# Patient Record
Sex: Male | Born: 1960
Health system: Southern US, Community
[De-identification: ages and names within clinical notes are randomized; demographics above are authoritative.]

## PROBLEM LIST (undated history)

## (undated) DIAGNOSIS — F32A Depression, unspecified: Secondary | ICD-10-CM

## (undated) DIAGNOSIS — J45909 Unspecified asthma, uncomplicated: Secondary | ICD-10-CM

## (undated) DIAGNOSIS — N049 Nephrotic syndrome with unspecified morphologic changes: Secondary | ICD-10-CM

## (undated) DIAGNOSIS — F419 Anxiety disorder, unspecified: Secondary | ICD-10-CM

## (undated) DIAGNOSIS — F329 Major depressive disorder, single episode, unspecified: Secondary | ICD-10-CM

## (undated) DIAGNOSIS — I10 Essential (primary) hypertension: Secondary | ICD-10-CM

## (undated) HISTORY — PX: RENAL BIOPSY: SHX156

---
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
## 2005-01-07 ENCOUNTER — Encounter: Admission: RE | Admit: 2005-01-07 | Discharge: 2005-01-07 | Payer: Self-pay | Admitting: Family Medicine

## 2013-10-12 ENCOUNTER — Ambulatory Visit (INDEPENDENT_AMBULATORY_CARE_PROVIDER_SITE_OTHER): Payer: 59 | Admitting: Physician Assistant

## 2013-10-12 VITALS — BP 130/90 | HR 122 | Temp 99.7°F | Wt 229.0 lb

## 2013-10-12 DIAGNOSIS — J309 Allergic rhinitis, unspecified: Secondary | ICD-10-CM

## 2013-10-12 DIAGNOSIS — J329 Chronic sinusitis, unspecified: Secondary | ICD-10-CM

## 2013-10-12 DIAGNOSIS — R0981 Nasal congestion: Secondary | ICD-10-CM

## 2013-10-12 DIAGNOSIS — J3489 Other specified disorders of nose and nasal sinuses: Secondary | ICD-10-CM

## 2013-10-12 MED ORDER — AMOXICILLIN-POT CLAVULANATE 875-125 MG PO TABS: 1.0000 | ORAL_TABLET | Freq: Two times a day (BID) | ORAL | Status: DC

## 2013-10-12 NOTE — Progress Notes (Signed)
   Subjective:    Patient ID: Darren Hamilton Andrew Pensyl, male    DOB: [birth year], 53 y.o.   MRN: [id number]  HPI 53 year old male presents for evaluation of a possible sinus infection. Symptoms have been progressively worsening x 1 week. Has left maxillary sinus pain, body aches, and chills.  Complains of slight sinus headache.  Admits he has been taking Zyrtec-D for about [redacted] weeks along with using his nasonex.    Denies sore throat, otalgia, cough, SOB, wheezing, nausea, or vomiting.   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     Review of Systems  Constitutional: Positive for fever and chills.  HENT: Positive for congestion, postnasal drip, rhinorrhea and sinus pressure. Negative for sore throat.   Respiratory: Negative for cough, shortness of breath and wheezing.   Gastrointestinal: Negative for nausea and vomiting.  Neurological: Positive for headaches (sinus). Negative for dizziness.       Objective:   Physical Exam  Constitutional: He is oriented to person, place, and time. He appears well-developed and well-nourished.  HENT:  Head: Normocephalic and atraumatic.  Right Ear: Hearing, tympanic membrane, external ear and ear canal normal.  Left Ear: Hearing, tympanic membrane, external ear and ear canal normal.  Nose: Right sinus exhibits no frontal sinus tenderness. Left sinus exhibits maxillary sinus tenderness. Left sinus exhibits no frontal sinus tenderness.  Mouth/Throat: Uvula is midline, oropharynx is clear and moist and mucous membranes are normal.  Eyes: Conjunctivae are normal.  Neck: Normal range of motion. Neck supple.  Cardiovascular: Normal rate, regular rhythm and normal heart sounds.   Pulmonary/Chest: Effort normal and breath sounds normal.  Lymphadenopathy:    He has no cervical adenopathy.  Neurological: He is alert and oriented to person, place, and time.  Psychiatric: He has a normal mood and affect. His behavior is normal. Judgment and thought content  normal.          Assessment & Plan:  Sinus infection - Plan: amoxicillin-clavulanate (AUGMENTIN) 875-125 MG per tablet  Nasal congestion  Allergic rhinitis  Will treat as sinus infection with Augmentin 875 mg bid x 10 days D/C Zyrtec-D. Change to plain Mucinex twice daily to help with congestion Push fluids RTC precautions discussed Follow up if symptoms worsen or fail to improve.

## 2013-11-09 ENCOUNTER — Ambulatory Visit (INDEPENDENT_AMBULATORY_CARE_PROVIDER_SITE_OTHER): Payer: 59 | Admitting: Physician Assistant

## 2013-11-09 VITALS — BP 128/84 | HR 101 | Temp 97.8°F | Resp 18 | Ht 67.0 in | Wt 235.0 lb

## 2013-11-09 DIAGNOSIS — R21 Rash and other nonspecific skin eruption: Secondary | ICD-10-CM

## 2013-11-09 DIAGNOSIS — J3489 Other specified disorders of nose and nasal sinuses: Secondary | ICD-10-CM

## 2013-11-09 MED ORDER — DOXYCYCLINE HYCLATE 100 MG PO CAPS: 100.0000 mg | ORAL_CAPSULE | Freq: Two times a day (BID) | ORAL | Status: DC

## 2013-11-09 NOTE — Progress Notes (Signed)
Subjective:    Patient ID: Darren Hamilton, male    DOB: 30-Apr-1961, 53 y.o.   MRN: [id number]  HPI Primary Physician: No primary provider on file.  Chief Complaint: Right nostril pain x 4 days  HPI: 53 y.o. male with history below presents with 4 day history of right nostril pain. Pain is located along the insider of the right nostril at the distal portion of the septum. No injury or trauma to the nose, although his son dad jump up and hit him in the nose this morning. No drainage or discharge from the nose. Afebrile. No chills. No swelling. No rashes. Inside portion of the right nostril is quite tender to the touch. He was recently sick with sinusitis. Was treated with Augmentin. He took all of this. His sinusitis fully resolved with that treatment.    History reviewed. No pertinent past medical history.   Home Meds: Prior to Admission medications   Medication Sig Start Date End Date Taking? Authorizing Provider  aspirin 81 MG tablet Take 81 mg by mouth daily.   Yes Historical Provider, MD  buPROPion (WELLBUTRIN SR) 150 MG 12 hr tablet Take 150 mg by mouth daily.   Yes Historical Provider, MD  gemfibrozil (LOPID) 600 MG tablet Take 600 mg by mouth 2 (two) times daily before a meal.   Yes Historical Provider, MD  lisinopril (PRINIVIL,ZESTRIL) 10 MG tablet Take 10 mg by mouth daily.   Yes Historical Provider, MD  mometasone (NASONEX) 50 MCG/ACT nasal spray Place 2 sprays into the nose daily.   Yes Historical Provider, MD  Multiple Vitamin (MULTIVITAMIN) tablet Take 1 tablet by mouth daily.   Yes Historical Provider, MD  Olopatadine HCl (PATANASE) 0.6 % SOLN Place into the nose 2 (two) times daily.   Yes Historical Provider, MD  ranitidine (ZANTAC) 150 MG capsule Take 150 mg by mouth as needed for heartburn.   Yes Historical Provider, MD  sertraline (ZOLOFT) 50 MG tablet Take 50 mg by mouth daily.   Yes Historical Provider, MD    Allergies: No Known Allergies  History   Social History    . Marital Status: Single    Spouse Name: N/A    Number of Children: N/A  . Years of Education: N/A   Occupational History  . Not on file.   Social History Main Topics  . Smoking status: Never Smoker   . Smokeless tobacco: Never Used  . Alcohol Use: Yes  . Drug Use: No  . Sexual Activity: Not on file   Other Topics Concern  . Not on file   Social History Narrative  . No narrative on file     Review of Systems  Constitutional: Negative for fever and chills.  HENT: Positive for postnasal drip. Negative for congestion, nosebleeds, rhinorrhea, sinus pressure and sore throat.   Respiratory: Negative for cough.        Objective:   Physical Exam  Physical Exam: Blood pressure 128/84, pulse 101, temperature 97.8 F (36.6 C), temperature source Oral, resp. rate 18, height 5\' 7"  (1.702 m), weight 235 lb (106.595 kg), SpO2 95.00%., Body mass index is 36.8 kg/(m^2). General: Well developed, well nourished, in no acute distress. Head: Normocephalic, atraumatic, eyes without discharge, sclera non-icteric, nares are without discharge. There is a single dried up erythematous lesion along the inside wall of the right septum. This lesion is quite tender to palpation. No vesicles seen. Bilateral auditory canals clear, TM's are without perforation, pearly grey and translucent with reflective cone  of light bilaterally. Oral cavity moist, posterior pharynx without exudate, erythema, peritonsillar abscess, or post nasal drip. Uvula midline.   Neck: Supple. No thyromegaly. Full ROM. No lymphadenopathy. Lungs: Clear bilaterally to auscultation without wheezes, rales, or rhonchi. Breathing is unlabored. Heart: RRR with S1 S2. No murmurs, rubs, or gallops appreciated. Msk:  Strength and tone normal for age. Extremities/Skin: Warm and dry. No clubbing or cyanosis. No edema. No rashes or suspicious lesions. Neuro: Alert and oriented X 3. Moves all extremities spontaneously. Gait is normal. CNII-XII  grossly in tact. Psych:  Responds to questions appropriately with a normal affect.        Assessment & Plan:  53 year old male with cellulitis of the nare -Lesion is along the septum -Doxycycline 100 mg 1 po bid #20 no RF -Close follow up -If still symptomatic at the beginning of the week plan for ENT evaluation    Eula Listenyan Emmaclaire Switala, MHS, PA-C [facility] [address] [place], [place] [address] [phone] [facility] 11/09/2013 2:41 PM

## 2014-03-28 ENCOUNTER — Encounter: Payer: Self-pay | Admitting: *Deleted

## 2014-11-09 ENCOUNTER — Ambulatory Visit (INDEPENDENT_AMBULATORY_CARE_PROVIDER_SITE_OTHER): Payer: 59 | Admitting: Emergency Medicine

## 2014-11-09 ENCOUNTER — Ambulatory Visit (INDEPENDENT_AMBULATORY_CARE_PROVIDER_SITE_OTHER): Payer: 59

## 2014-11-09 VITALS — BP 130/90 | HR 112 | Temp 98.1°F | Ht 67.0 in | Wt 233.4 lb

## 2014-11-09 DIAGNOSIS — H109 Unspecified conjunctivitis: Secondary | ICD-10-CM | POA: Diagnosis not present

## 2014-11-09 DIAGNOSIS — J309 Allergic rhinitis, unspecified: Secondary | ICD-10-CM | POA: Diagnosis not present

## 2014-11-09 DIAGNOSIS — J040 Acute laryngitis: Secondary | ICD-10-CM

## 2014-11-09 DIAGNOSIS — R0981 Nasal congestion: Secondary | ICD-10-CM

## 2014-11-09 DIAGNOSIS — R05 Cough: Secondary | ICD-10-CM

## 2014-11-09 DIAGNOSIS — F329 Major depressive disorder, single episode, unspecified: Secondary | ICD-10-CM | POA: Diagnosis not present

## 2014-11-09 DIAGNOSIS — F32A Depression, unspecified: Secondary | ICD-10-CM

## 2014-11-09 DIAGNOSIS — R059 Cough, unspecified: Secondary | ICD-10-CM

## 2014-11-09 LAB — POCT CBC
Granulocyte percent: 62 %G (ref 37–80)
HCT, POC: 43.6 % (ref 43.5–53.7)
Hemoglobin: 14.3 g/dL (ref 14.1–18.1)
Lymph, poc: 2.6 (ref 0.6–3.4)
MCH, POC: 82.8 pg — AB (ref 27–31.2)
MCHC: 32.8 g/dL (ref 31.8–35.4)
MCV: 88 fL (ref 80–97)
MID (cbc): 0.8 (ref 0–0.9)
MPV: 6.8 fL (ref 0–99.8)
PLATELET COUNT, POC: 403 10*3/uL (ref 142–424)
POC Granulocyte: 5.6 (ref 2–6.9)
POC LYMPH %: 29.1 % (ref 10–50)
POC MID %: 8.9 %M (ref 0–12)
RBC: 4.95 M/uL (ref 4.69–6.13)
RDW, POC: 14 %
WBC: 9 10*3/uL (ref 4.6–10.2)

## 2014-11-09 MED ORDER — AZITHROMYCIN 250 MG PO TABS: ORAL_TABLET | ORAL | Status: DC

## 2014-11-09 MED ORDER — HYDROCODONE-HOMATROPINE 5-1.5 MG/5ML PO SYRP: 5.0000 mL | ORAL_SOLUTION | Freq: Three times a day (TID) | ORAL | Status: DC | PRN

## 2014-11-09 MED ORDER — OFLOXACIN 0.3 % OP SOLN: 1.0000 [drp] | Freq: Four times a day (QID) | OPHTHALMIC | Status: DC

## 2014-11-09 MED ORDER — BECLOMETHASONE DIPROPIONATE 80 MCG/ACT IN AERS: 2.0000 | INHALATION_SPRAY | Freq: Two times a day (BID) | RESPIRATORY_TRACT | Status: DC

## 2014-11-09 NOTE — Progress Notes (Signed)
Subjective:    Patient ID: Darren Hamilton, male    DOB: 1960-08-18, 54 y.o.   MRN: [id number]  HPI  Scribed by Chalmers Cater RN with Dr. Earl Lites present in the room.  54 yo male presents with bronchitis in room 14 @ [facility].   He states symptoms began a week ago  last Saturday, with viral issues, diarrhea, with wheezing beginning on Tuesday and has been getting worse with laryngitis over the past 3 days.  Through the weekeend, pt has been coughing with related rib pain.  Product coughing up has been greenish colored  And cough continues.   Pt using Albuterol every 4 hours since Tuesday evening and seems to be helping less.  Pt denies fever.  Admits to myalgia.  Beginning Friday, pt had drainage from eyes  R>L. With thick mucous drainage resolving into tears.  Taking Mucinex and Tylenol.  Pt states his son has had similar symptoms 2 weeks before today.   Pt denies any ear pain. Admits to tonsil pain/soreness.    Review of Systems  Constitutional: Positive for fatigue. Negative for chills.  HENT: Positive for congestion and postnasal drip.   Eyes: Positive for discharge and redness.  Respiratory: Positive for cough and wheezing.   Cardiovascular: Negative for chest pain.  Gastrointestinal: Negative.   Endocrine: Negative.        Objective:   Physical Exam  Constitutional: He appears well-developed and well-nourished.  HENT:  Right Ear: External ear normal.  Left Ear: External ear normal.  Eyes: Pupils are equal, round, and reactive to light.  Appears to be a clear drainage from both eyes.  Cardiovascular: Normal rate and regular rhythm.   Pulmonary/Chest:  Chest has coarse breath sounds no rales. No areas of dullness.  Abdominal: There is no tenderness.   [facility] reading (PRIMARY) by  Dr. Cleta Alberts increased markings in both bases. There is elevation of the right hemidiaphragm. There are no pneumonic infiltrates. Results for orders placed or performed in visit on 11/09/14  POCT CBC    Result Value Ref Range   WBC 9.0 4.6 - 10.2 K/uL   Lymph, poc 2.6 0.6 - 3.4   POC LYMPH PERCENT 29.1 10 - 50 %L   MID (cbc) 0.8 0 - 0.9   POC MID % 8.9 0 - 12 %M   POC Granulocyte 5.6 2 - 6.9   Granulocyte percent 62.0 37 - 80 %G   RBC 4.95 4.69 - 6.13 M/uL   Hemoglobin 14.3 14.1 - 18.1 g/dL   HCT, POC 40.9 81.1 - 53.7 %   MCV 88.0 80 - 97 fL   MCH, POC 82.8 (A) 27 - 31.2 pg   MCHC 32.8 31.8 - 35.4 g/dL   RDW, POC 91.4 %   Platelet Count, POC 403 142 - 424 K/uL   MPV 6.8 0 - 99.8 fL   Meds ordered this encounter  Medications  . albuterol (PROVENTIL HFA;VENTOLIN HFA) 108 (90 BASE) MCG/ACT inhaler    Sig: Inhale 1-2 puffs into the lungs every 6 (six) hours as needed for wheezing or shortness of breath.  . escitalopram (LEXAPRO) 20 MG tablet    Sig: Take 20 mg by mouth daily.  . fluticasone (FLONASE) 50 MCG/ACT nasal spray    Sig: Place 2 sprays into both nostrils daily.  Marland Kitchen acetaminophen (TYLENOL) 325 MG tablet    Sig: Take 650 mg by mouth every 6 (six) hours as needed.  Marland Kitchen guaiFENesin (MUCINEX) 600 MG 12 hr tablet  Sig: Take by mouth 2 (two) times daily as needed.  . Bepotastine Besilate 1.5 % SOLN    Sig: Place 1 drop into both eyes 2 (two) times daily.  Marland Kitchen. HYDROcodone-homatropine (HYCODAN) 5-1.5 MG/5ML syrup    Sig: Take 5 mLs by mouth every 8 (eight) hours as needed for cough.    Dispense:  120 mL    Refill:  0  . beclomethasone (QVAR) 80 MCG/ACT inhaler    Sig: Inhale 2 puffs into the lungs 2 (two) times daily.    Dispense:  1 Inhaler    Refill:  11  . azithromycin (ZITHROMAX) 250 MG tablet    Sig: Take 2 tabs PO x 1 dose, then 1 tab PO QD x 4 days    Dispense:  6 tablet    Refill:  0  . ofloxacin (OCUFLOX) 0.3 % ophthalmic solution    Sig: Place 1 drop into both eyes 4 (four) times daily.    Dispense:  5 mL    Refill:  0       Assessment & Plan:  1. Cough  - POCT CBC - DG Chest 2 View; Future - HYDROcodone-homatropine (HYCODAN) 5-1.5 MG/5ML syrup; Take 5  mLs by mouth every 8 (eight) hours as needed for cough.  Dispense: 120 mL; Refill: 0 - beclomethasone (QVAR) 80 MCG/ACT inhaler; Inhale 2 puffs into the lungs 2 (two) times daily.  Dispense: 1 Inhaler; Refill: 11 - azithromycin (ZITHROMAX) 250 MG tablet; Take 2 tabs PO x 1 dose, then 1 tab PO QD x 4 days  Dispense: 6 tablet; Refill: 0  2. Nasal congestion He can use saline spray for this.  3. Allergic rhinitis, unspecified allergic rhinitis type He is on Zyrtec.  4. Laryngitis Chest x-ray unremarkable - DG Chest 2 View; Future  5. Bilateral conjunctivitis  - ofloxacin (OCUFLOX) 0.3 % ophthalmic solution; Place 1 drop into both eyes 4 (four) times daily.  Dispense: 5 mL; Refill: 0   I personally performed the services described in this documentation, which was scribed in my presence. The recorded information has been reviewed and is accurate.  Lesle ChrisSteven Klark Vanderhoef, MD  [facility], [facility]  11/09/2014 1:57 PM

## 2015-09-12 ENCOUNTER — Ambulatory Visit (INDEPENDENT_AMBULATORY_CARE_PROVIDER_SITE_OTHER): Payer: 59 | Admitting: Physician Assistant

## 2015-09-12 VITALS — BP 116/78 | HR 91 | Temp 97.5°F | Resp 18 | Ht 66.5 in | Wt 236.0 lb

## 2015-09-12 DIAGNOSIS — J029 Acute pharyngitis, unspecified: Secondary | ICD-10-CM | POA: Diagnosis not present

## 2015-09-12 LAB — POCT RAPID STREP A (OFFICE): Rapid Strep A Screen: NEGATIVE

## 2015-09-12 MED ORDER — AMOXICILLIN 875 MG PO TABS: 875.0000 mg | ORAL_TABLET | Freq: Two times a day (BID) | ORAL | Status: DC

## 2015-09-12 MED ORDER — HYDROCODONE-HOMATROPINE 5-1.5 MG/5ML PO SYRP: 5.0000 mL | ORAL_SOLUTION | Freq: Every day | ORAL | Status: DC

## 2015-09-12 NOTE — Progress Notes (Signed)
09/12/2015 11:31 AM   DOB: [birth year] / MRN: [id number]  SUBJECTIVE:  Darren Hamilton is a 55 y.o. male presenting for a sore throat that started last night. He denies cough and anterior neck pain.  Denies fever and has not taken anything that would reduce fever.  Feels that he is getting worse.  Associates some sinus congestion however this is normal for him during this time of year.   He has No Known Allergies.   He  has no past medical history on file.    He  reports that he has never smoked. He has never used smokeless tobacco. He reports that he drinks alcohol. He reports that he does not use illicit drugs. He  has no sexual activity history on file. The patient  has no past surgical history on file.  His family history is not on file.  Review of Systems  Constitutional: Negative for chills and diaphoresis.  Respiratory: Negative for cough and shortness of breath.   Gastrointestinal: Negative for nausea and abdominal pain.  Skin: Negative for rash.  Neurological: Negative for dizziness and headaches.    Problem list and medications reviewed and updated by myself where necessary, and exist elsewhere in the encounter.   OBJECTIVE:  BP 116/78 mmHg  Pulse 91  Temp(Src) 97.5 F (36.4 C) (Oral)  Resp 18  Ht 5' 6.5" (1.689 m)  Wt 236 lb (107.049 kg)  BMI 37.53 kg/m2  SpO2 97%  Physical Exam  Constitutional: He is oriented to person, place, and time. He appears well-developed. He does not appear ill.  HENT:  Right Ear: Tympanic membrane normal.  Left Ear: Tympanic membrane normal.  Nose: Nose normal.  Mouth/Throat:    Eyes: Conjunctivae and EOM are normal. Pupils are equal, round, and reactive to light.  Cardiovascular: Normal rate.   Pulmonary/Chest: Effort normal.  Abdominal: He exhibits no distension.  Musculoskeletal: Normal range of motion.  Lymphadenopathy:       Head (right side): No tonsillar adenopathy present.       Head (left side): No tonsillar adenopathy  present.    He has no cervical adenopathy.    He has no axillary adenopathy.  Neurological: He is alert and oriented to person, place, and time. No cranial nerve deficit. Coordination normal.  Skin: Skin is warm and dry. He is not diaphoretic.  Psychiatric: He has a normal mood and affect.  Nursing note and vitals reviewed.  No results found for: CREATININE  No results found for this or any previous visit (from the past 72 hour(s)).  No results found.  ASSESSMENT AND PLAN  Darren Hamilton was seen today for sore throat and sinusitis.  Diagnoses and all orders for this visit:  Sore throat: He lacks cough, lymphadenopathy and there is no exudate in his throat.  This may be an early cold or an early strep.  Advised symptomatic care for now.  Will provide Amox however advised that he not fill this until the culture results, as again this may be an early viral illness and he has not developed cough.Provided hycodan and advised that he fill this only if he begins to cough.  He is to call if he has any problems.   -     POCT rapid strep A -     Culture, Group A Strep   The patient was advised to call or return to clinic if he does not see an improvement in symptoms or to seek the care of the closest emergency department  if he worsens with the above plan.   Deliah BostonMichael Clark, MHS, PA-C [facility] 09/12/2015 11:31 AM

## 2015-09-12 NOTE — Patient Instructions (Addendum)
     IF you received an x-ray today, you will receive an invoice from [facility]. Please contact [facility] at [phone] with questions or concerns regarding your invoice.   IF you received labwork today, you will receive an invoice from [facility]. Please contact [facility] at [phone] with questions or concerns regarding your invoice.   Our billing staff will not be able to assist you with questions regarding bills from these companies.  You will be contacted with the lab results as soon as they are available. The fastest way to get your results is to activate your My Chart account. Instructions are located on the last page of this paperwork. If you have not heard from us regarding the results in 2 weeks, please contact this office.      

## 2015-09-13 LAB — CULTURE, GROUP A STREP: Organism ID, Bacteria: NORMAL

## 2016-05-24 DIAGNOSIS — J3089 Other allergic rhinitis: Secondary | ICD-10-CM | POA: Diagnosis not present

## 2016-05-24 DIAGNOSIS — F39 Unspecified mood [affective] disorder: Secondary | ICD-10-CM | POA: Diagnosis not present

## 2016-05-24 DIAGNOSIS — J3081 Allergic rhinitis due to animal (cat) (dog) hair and dander: Secondary | ICD-10-CM | POA: Diagnosis not present

## 2016-06-07 DIAGNOSIS — J3081 Allergic rhinitis due to animal (cat) (dog) hair and dander: Secondary | ICD-10-CM | POA: Diagnosis not present

## 2016-06-07 DIAGNOSIS — J3089 Other allergic rhinitis: Secondary | ICD-10-CM | POA: Diagnosis not present

## 2016-06-14 DIAGNOSIS — J3081 Allergic rhinitis due to animal (cat) (dog) hair and dander: Secondary | ICD-10-CM | POA: Diagnosis not present

## 2016-06-14 DIAGNOSIS — J3089 Other allergic rhinitis: Secondary | ICD-10-CM | POA: Diagnosis not present

## 2016-06-14 DIAGNOSIS — F39 Unspecified mood [affective] disorder: Secondary | ICD-10-CM | POA: Diagnosis not present

## 2016-06-28 DIAGNOSIS — F39 Unspecified mood [affective] disorder: Secondary | ICD-10-CM | POA: Diagnosis not present

## 2016-06-28 DIAGNOSIS — J3081 Allergic rhinitis due to animal (cat) (dog) hair and dander: Secondary | ICD-10-CM | POA: Diagnosis not present

## 2016-06-28 DIAGNOSIS — J3089 Other allergic rhinitis: Secondary | ICD-10-CM | POA: Diagnosis not present

## 2016-07-19 DIAGNOSIS — J3089 Other allergic rhinitis: Secondary | ICD-10-CM | POA: Diagnosis not present

## 2016-07-19 DIAGNOSIS — J3081 Allergic rhinitis due to animal (cat) (dog) hair and dander: Secondary | ICD-10-CM | POA: Diagnosis not present

## 2016-07-19 DIAGNOSIS — F39 Unspecified mood [affective] disorder: Secondary | ICD-10-CM | POA: Diagnosis not present

## 2016-07-26 DIAGNOSIS — J3081 Allergic rhinitis due to animal (cat) (dog) hair and dander: Secondary | ICD-10-CM | POA: Diagnosis not present

## 2016-07-26 DIAGNOSIS — J3089 Other allergic rhinitis: Secondary | ICD-10-CM | POA: Diagnosis not present

## 2016-08-02 DIAGNOSIS — J3081 Allergic rhinitis due to animal (cat) (dog) hair and dander: Secondary | ICD-10-CM | POA: Diagnosis not present

## 2016-08-02 DIAGNOSIS — J3089 Other allergic rhinitis: Secondary | ICD-10-CM | POA: Diagnosis not present

## 2016-08-02 DIAGNOSIS — F39 Unspecified mood [affective] disorder: Secondary | ICD-10-CM | POA: Diagnosis not present

## 2016-08-09 DIAGNOSIS — J3089 Other allergic rhinitis: Secondary | ICD-10-CM | POA: Diagnosis not present

## 2016-08-09 DIAGNOSIS — J3081 Allergic rhinitis due to animal (cat) (dog) hair and dander: Secondary | ICD-10-CM | POA: Diagnosis not present

## 2016-08-23 DIAGNOSIS — F39 Unspecified mood [affective] disorder: Secondary | ICD-10-CM | POA: Diagnosis not present

## 2016-08-23 DIAGNOSIS — J3089 Other allergic rhinitis: Secondary | ICD-10-CM | POA: Diagnosis not present

## 2016-08-23 DIAGNOSIS — J3081 Allergic rhinitis due to animal (cat) (dog) hair and dander: Secondary | ICD-10-CM | POA: Diagnosis not present

## 2016-08-30 DIAGNOSIS — J3081 Allergic rhinitis due to animal (cat) (dog) hair and dander: Secondary | ICD-10-CM | POA: Diagnosis not present

## 2016-08-30 DIAGNOSIS — J3089 Other allergic rhinitis: Secondary | ICD-10-CM | POA: Diagnosis not present

## 2016-09-06 DIAGNOSIS — J3089 Other allergic rhinitis: Secondary | ICD-10-CM | POA: Diagnosis not present

## 2016-09-06 DIAGNOSIS — J3081 Allergic rhinitis due to animal (cat) (dog) hair and dander: Secondary | ICD-10-CM | POA: Diagnosis not present

## 2016-09-13 DIAGNOSIS — F39 Unspecified mood [affective] disorder: Secondary | ICD-10-CM | POA: Diagnosis not present

## 2016-09-13 DIAGNOSIS — J3081 Allergic rhinitis due to animal (cat) (dog) hair and dander: Secondary | ICD-10-CM | POA: Diagnosis not present

## 2016-09-13 DIAGNOSIS — J3089 Other allergic rhinitis: Secondary | ICD-10-CM | POA: Diagnosis not present

## 2016-09-20 DIAGNOSIS — J3081 Allergic rhinitis due to animal (cat) (dog) hair and dander: Secondary | ICD-10-CM | POA: Diagnosis not present

## 2016-09-20 DIAGNOSIS — J3089 Other allergic rhinitis: Secondary | ICD-10-CM | POA: Diagnosis not present

## 2016-09-27 DIAGNOSIS — J3081 Allergic rhinitis due to animal (cat) (dog) hair and dander: Secondary | ICD-10-CM | POA: Diagnosis not present

## 2016-09-27 DIAGNOSIS — J3089 Other allergic rhinitis: Secondary | ICD-10-CM | POA: Diagnosis not present

## 2016-10-04 DIAGNOSIS — J3089 Other allergic rhinitis: Secondary | ICD-10-CM | POA: Diagnosis not present

## 2016-10-04 DIAGNOSIS — J3081 Allergic rhinitis due to animal (cat) (dog) hair and dander: Secondary | ICD-10-CM | POA: Diagnosis not present

## 2016-10-04 DIAGNOSIS — F39 Unspecified mood [affective] disorder: Secondary | ICD-10-CM | POA: Diagnosis not present

## 2016-10-11 DIAGNOSIS — J3089 Other allergic rhinitis: Secondary | ICD-10-CM | POA: Diagnosis not present

## 2016-10-11 DIAGNOSIS — J309 Allergic rhinitis, unspecified: Secondary | ICD-10-CM | POA: Diagnosis not present

## 2016-10-11 DIAGNOSIS — E782 Mixed hyperlipidemia: Secondary | ICD-10-CM | POA: Diagnosis not present

## 2016-10-11 DIAGNOSIS — I10 Essential (primary) hypertension: Secondary | ICD-10-CM | POA: Diagnosis not present

## 2016-10-11 DIAGNOSIS — F322 Major depressive disorder, single episode, severe without psychotic features: Secondary | ICD-10-CM | POA: Diagnosis not present

## 2016-10-11 DIAGNOSIS — J3081 Allergic rhinitis due to animal (cat) (dog) hair and dander: Secondary | ICD-10-CM | POA: Diagnosis not present

## 2016-10-18 DIAGNOSIS — J3081 Allergic rhinitis due to animal (cat) (dog) hair and dander: Secondary | ICD-10-CM | POA: Diagnosis not present

## 2016-10-18 DIAGNOSIS — J3089 Other allergic rhinitis: Secondary | ICD-10-CM | POA: Diagnosis not present

## 2016-10-20 DIAGNOSIS — J3089 Other allergic rhinitis: Secondary | ICD-10-CM | POA: Diagnosis not present

## 2016-10-20 DIAGNOSIS — J3081 Allergic rhinitis due to animal (cat) (dog) hair and dander: Secondary | ICD-10-CM | POA: Diagnosis not present

## 2016-10-25 DIAGNOSIS — J3081 Allergic rhinitis due to animal (cat) (dog) hair and dander: Secondary | ICD-10-CM | POA: Diagnosis not present

## 2016-10-25 DIAGNOSIS — F39 Unspecified mood [affective] disorder: Secondary | ICD-10-CM | POA: Diagnosis not present

## 2016-10-25 DIAGNOSIS — J3089 Other allergic rhinitis: Secondary | ICD-10-CM | POA: Diagnosis not present

## 2016-11-01 DIAGNOSIS — J3081 Allergic rhinitis due to animal (cat) (dog) hair and dander: Secondary | ICD-10-CM | POA: Diagnosis not present

## 2016-11-01 DIAGNOSIS — J3089 Other allergic rhinitis: Secondary | ICD-10-CM | POA: Diagnosis not present

## 2016-11-07 DIAGNOSIS — J3089 Other allergic rhinitis: Secondary | ICD-10-CM | POA: Diagnosis not present

## 2016-11-07 DIAGNOSIS — J3081 Allergic rhinitis due to animal (cat) (dog) hair and dander: Secondary | ICD-10-CM | POA: Diagnosis not present

## 2016-11-08 DIAGNOSIS — F322 Major depressive disorder, single episode, severe without psychotic features: Secondary | ICD-10-CM | POA: Diagnosis not present

## 2016-11-15 DIAGNOSIS — J3089 Other allergic rhinitis: Secondary | ICD-10-CM | POA: Diagnosis not present

## 2016-11-15 DIAGNOSIS — J3081 Allergic rhinitis due to animal (cat) (dog) hair and dander: Secondary | ICD-10-CM | POA: Diagnosis not present

## 2016-11-22 DIAGNOSIS — J301 Allergic rhinitis due to pollen: Secondary | ICD-10-CM | POA: Diagnosis not present

## 2016-11-22 DIAGNOSIS — J3089 Other allergic rhinitis: Secondary | ICD-10-CM | POA: Diagnosis not present

## 2016-11-22 DIAGNOSIS — F39 Unspecified mood [affective] disorder: Secondary | ICD-10-CM | POA: Diagnosis not present

## 2016-11-29 DIAGNOSIS — J3081 Allergic rhinitis due to animal (cat) (dog) hair and dander: Secondary | ICD-10-CM | POA: Diagnosis not present

## 2016-11-29 DIAGNOSIS — J3089 Other allergic rhinitis: Secondary | ICD-10-CM | POA: Diagnosis not present

## 2016-12-06 DIAGNOSIS — F39 Unspecified mood [affective] disorder: Secondary | ICD-10-CM | POA: Diagnosis not present

## 2016-12-06 DIAGNOSIS — J3081 Allergic rhinitis due to animal (cat) (dog) hair and dander: Secondary | ICD-10-CM | POA: Diagnosis not present

## 2016-12-06 DIAGNOSIS — J3089 Other allergic rhinitis: Secondary | ICD-10-CM | POA: Diagnosis not present

## 2016-12-13 DIAGNOSIS — J3081 Allergic rhinitis due to animal (cat) (dog) hair and dander: Secondary | ICD-10-CM | POA: Diagnosis not present

## 2016-12-13 DIAGNOSIS — J3089 Other allergic rhinitis: Secondary | ICD-10-CM | POA: Diagnosis not present

## 2016-12-20 DIAGNOSIS — F39 Unspecified mood [affective] disorder: Secondary | ICD-10-CM | POA: Diagnosis not present

## 2016-12-20 DIAGNOSIS — J3081 Allergic rhinitis due to animal (cat) (dog) hair and dander: Secondary | ICD-10-CM | POA: Diagnosis not present

## 2016-12-20 DIAGNOSIS — J3089 Other allergic rhinitis: Secondary | ICD-10-CM | POA: Diagnosis not present

## 2016-12-27 DIAGNOSIS — J3089 Other allergic rhinitis: Secondary | ICD-10-CM | POA: Diagnosis not present

## 2016-12-27 DIAGNOSIS — J3081 Allergic rhinitis due to animal (cat) (dog) hair and dander: Secondary | ICD-10-CM | POA: Diagnosis not present

## 2017-01-03 DIAGNOSIS — J3081 Allergic rhinitis due to animal (cat) (dog) hair and dander: Secondary | ICD-10-CM | POA: Diagnosis not present

## 2017-01-03 DIAGNOSIS — J3089 Other allergic rhinitis: Secondary | ICD-10-CM | POA: Diagnosis not present

## 2017-01-03 DIAGNOSIS — F39 Unspecified mood [affective] disorder: Secondary | ICD-10-CM | POA: Diagnosis not present

## 2017-01-10 DIAGNOSIS — J3081 Allergic rhinitis due to animal (cat) (dog) hair and dander: Secondary | ICD-10-CM | POA: Diagnosis not present

## 2017-01-10 DIAGNOSIS — J3089 Other allergic rhinitis: Secondary | ICD-10-CM | POA: Diagnosis not present

## 2017-01-17 DIAGNOSIS — J3081 Allergic rhinitis due to animal (cat) (dog) hair and dander: Secondary | ICD-10-CM | POA: Diagnosis not present

## 2017-01-17 DIAGNOSIS — J3089 Other allergic rhinitis: Secondary | ICD-10-CM | POA: Diagnosis not present

## 2017-01-24 DIAGNOSIS — J3089 Other allergic rhinitis: Secondary | ICD-10-CM | POA: Diagnosis not present

## 2017-01-24 DIAGNOSIS — J3081 Allergic rhinitis due to animal (cat) (dog) hair and dander: Secondary | ICD-10-CM | POA: Diagnosis not present

## 2017-01-31 DIAGNOSIS — F39 Unspecified mood [affective] disorder: Secondary | ICD-10-CM | POA: Diagnosis not present

## 2017-02-02 DIAGNOSIS — J3081 Allergic rhinitis due to animal (cat) (dog) hair and dander: Secondary | ICD-10-CM | POA: Diagnosis not present

## 2017-02-02 DIAGNOSIS — J3089 Other allergic rhinitis: Secondary | ICD-10-CM | POA: Diagnosis not present

## 2017-02-07 DIAGNOSIS — J3089 Other allergic rhinitis: Secondary | ICD-10-CM | POA: Diagnosis not present

## 2017-02-07 DIAGNOSIS — J3081 Allergic rhinitis due to animal (cat) (dog) hair and dander: Secondary | ICD-10-CM | POA: Diagnosis not present

## 2017-02-28 DIAGNOSIS — J3089 Other allergic rhinitis: Secondary | ICD-10-CM | POA: Diagnosis not present

## 2017-02-28 DIAGNOSIS — R062 Wheezing: Secondary | ICD-10-CM | POA: Diagnosis not present

## 2017-02-28 DIAGNOSIS — R0609 Other forms of dyspnea: Secondary | ICD-10-CM | POA: Diagnosis not present

## 2017-02-28 DIAGNOSIS — J3081 Allergic rhinitis due to animal (cat) (dog) hair and dander: Secondary | ICD-10-CM | POA: Diagnosis not present

## 2017-03-07 DIAGNOSIS — F39 Unspecified mood [affective] disorder: Secondary | ICD-10-CM | POA: Diagnosis not present

## 2017-03-21 DIAGNOSIS — J3081 Allergic rhinitis due to animal (cat) (dog) hair and dander: Secondary | ICD-10-CM | POA: Diagnosis not present

## 2017-03-21 DIAGNOSIS — F39 Unspecified mood [affective] disorder: Secondary | ICD-10-CM | POA: Diagnosis not present

## 2017-03-21 DIAGNOSIS — J3089 Other allergic rhinitis: Secondary | ICD-10-CM | POA: Diagnosis not present

## 2017-03-28 DIAGNOSIS — J3081 Allergic rhinitis due to animal (cat) (dog) hair and dander: Secondary | ICD-10-CM | POA: Diagnosis not present

## 2017-03-28 DIAGNOSIS — J3089 Other allergic rhinitis: Secondary | ICD-10-CM | POA: Diagnosis not present

## 2017-04-04 DIAGNOSIS — J3089 Other allergic rhinitis: Secondary | ICD-10-CM | POA: Diagnosis not present

## 2017-04-04 DIAGNOSIS — F39 Unspecified mood [affective] disorder: Secondary | ICD-10-CM | POA: Diagnosis not present

## 2017-04-04 DIAGNOSIS — J3081 Allergic rhinitis due to animal (cat) (dog) hair and dander: Secondary | ICD-10-CM | POA: Diagnosis not present

## 2017-04-11 DIAGNOSIS — J3081 Allergic rhinitis due to animal (cat) (dog) hair and dander: Secondary | ICD-10-CM | POA: Diagnosis not present

## 2017-04-11 DIAGNOSIS — J3089 Other allergic rhinitis: Secondary | ICD-10-CM | POA: Diagnosis not present

## 2017-04-18 DIAGNOSIS — J3081 Allergic rhinitis due to animal (cat) (dog) hair and dander: Secondary | ICD-10-CM | POA: Diagnosis not present

## 2017-04-18 DIAGNOSIS — F39 Unspecified mood [affective] disorder: Secondary | ICD-10-CM | POA: Diagnosis not present

## 2017-04-18 DIAGNOSIS — J3089 Other allergic rhinitis: Secondary | ICD-10-CM | POA: Diagnosis not present

## 2017-04-25 DIAGNOSIS — J3081 Allergic rhinitis due to animal (cat) (dog) hair and dander: Secondary | ICD-10-CM | POA: Diagnosis not present

## 2017-04-25 DIAGNOSIS — J3089 Other allergic rhinitis: Secondary | ICD-10-CM | POA: Diagnosis not present

## 2017-05-02 DIAGNOSIS — J3089 Other allergic rhinitis: Secondary | ICD-10-CM | POA: Diagnosis not present

## 2017-05-02 DIAGNOSIS — J3081 Allergic rhinitis due to animal (cat) (dog) hair and dander: Secondary | ICD-10-CM | POA: Diagnosis not present

## 2017-05-09 DIAGNOSIS — J3089 Other allergic rhinitis: Secondary | ICD-10-CM | POA: Diagnosis not present

## 2017-05-09 DIAGNOSIS — F39 Unspecified mood [affective] disorder: Secondary | ICD-10-CM | POA: Diagnosis not present

## 2017-05-23 DIAGNOSIS — F39 Unspecified mood [affective] disorder: Secondary | ICD-10-CM | POA: Diagnosis not present

## 2017-06-13 DIAGNOSIS — J3081 Allergic rhinitis due to animal (cat) (dog) hair and dander: Secondary | ICD-10-CM | POA: Diagnosis not present

## 2017-06-13 DIAGNOSIS — J3089 Other allergic rhinitis: Secondary | ICD-10-CM | POA: Diagnosis not present

## 2017-06-20 DIAGNOSIS — J3089 Other allergic rhinitis: Secondary | ICD-10-CM | POA: Diagnosis not present

## 2017-06-20 DIAGNOSIS — J3081 Allergic rhinitis due to animal (cat) (dog) hair and dander: Secondary | ICD-10-CM | POA: Diagnosis not present

## 2017-06-27 DIAGNOSIS — J3089 Other allergic rhinitis: Secondary | ICD-10-CM | POA: Diagnosis not present

## 2017-06-27 DIAGNOSIS — J3081 Allergic rhinitis due to animal (cat) (dog) hair and dander: Secondary | ICD-10-CM | POA: Diagnosis not present

## 2017-06-27 DIAGNOSIS — F39 Unspecified mood [affective] disorder: Secondary | ICD-10-CM | POA: Diagnosis not present

## 2017-07-04 DIAGNOSIS — J3089 Other allergic rhinitis: Secondary | ICD-10-CM | POA: Diagnosis not present

## 2017-07-04 DIAGNOSIS — J3081 Allergic rhinitis due to animal (cat) (dog) hair and dander: Secondary | ICD-10-CM | POA: Diagnosis not present

## 2017-07-11 DIAGNOSIS — J3081 Allergic rhinitis due to animal (cat) (dog) hair and dander: Secondary | ICD-10-CM | POA: Diagnosis not present

## 2017-07-11 DIAGNOSIS — J3089 Other allergic rhinitis: Secondary | ICD-10-CM | POA: Diagnosis not present

## 2017-07-11 DIAGNOSIS — F39 Unspecified mood [affective] disorder: Secondary | ICD-10-CM | POA: Diagnosis not present

## 2017-07-18 DIAGNOSIS — J3089 Other allergic rhinitis: Secondary | ICD-10-CM | POA: Diagnosis not present

## 2017-07-18 DIAGNOSIS — J3081 Allergic rhinitis due to animal (cat) (dog) hair and dander: Secondary | ICD-10-CM | POA: Diagnosis not present

## 2017-07-25 DIAGNOSIS — J3081 Allergic rhinitis due to animal (cat) (dog) hair and dander: Secondary | ICD-10-CM | POA: Diagnosis not present

## 2017-07-25 DIAGNOSIS — J3089 Other allergic rhinitis: Secondary | ICD-10-CM | POA: Diagnosis not present

## 2017-07-25 DIAGNOSIS — F39 Unspecified mood [affective] disorder: Secondary | ICD-10-CM | POA: Diagnosis not present

## 2017-08-01 DIAGNOSIS — J3081 Allergic rhinitis due to animal (cat) (dog) hair and dander: Secondary | ICD-10-CM | POA: Diagnosis not present

## 2017-08-01 DIAGNOSIS — J3089 Other allergic rhinitis: Secondary | ICD-10-CM | POA: Diagnosis not present

## 2017-08-08 DIAGNOSIS — J3089 Other allergic rhinitis: Secondary | ICD-10-CM | POA: Diagnosis not present

## 2017-08-08 DIAGNOSIS — J3081 Allergic rhinitis due to animal (cat) (dog) hair and dander: Secondary | ICD-10-CM | POA: Diagnosis not present

## 2017-08-15 DIAGNOSIS — F39 Unspecified mood [affective] disorder: Secondary | ICD-10-CM | POA: Diagnosis not present

## 2017-08-15 DIAGNOSIS — J3089 Other allergic rhinitis: Secondary | ICD-10-CM | POA: Diagnosis not present

## 2017-08-15 DIAGNOSIS — J3081 Allergic rhinitis due to animal (cat) (dog) hair and dander: Secondary | ICD-10-CM | POA: Diagnosis not present

## 2017-08-29 DIAGNOSIS — J3081 Allergic rhinitis due to animal (cat) (dog) hair and dander: Secondary | ICD-10-CM | POA: Diagnosis not present

## 2017-08-29 DIAGNOSIS — F39 Unspecified mood [affective] disorder: Secondary | ICD-10-CM | POA: Diagnosis not present

## 2017-08-29 DIAGNOSIS — J3089 Other allergic rhinitis: Secondary | ICD-10-CM | POA: Diagnosis not present

## 2017-09-12 DIAGNOSIS — J3089 Other allergic rhinitis: Secondary | ICD-10-CM | POA: Diagnosis not present

## 2017-09-12 DIAGNOSIS — F39 Unspecified mood [affective] disorder: Secondary | ICD-10-CM | POA: Diagnosis not present

## 2017-09-12 DIAGNOSIS — J3081 Allergic rhinitis due to animal (cat) (dog) hair and dander: Secondary | ICD-10-CM | POA: Diagnosis not present

## 2017-09-26 DIAGNOSIS — F39 Unspecified mood [affective] disorder: Secondary | ICD-10-CM | POA: Diagnosis not present

## 2017-10-10 DIAGNOSIS — J3081 Allergic rhinitis due to animal (cat) (dog) hair and dander: Secondary | ICD-10-CM | POA: Diagnosis not present

## 2017-10-10 DIAGNOSIS — J3089 Other allergic rhinitis: Secondary | ICD-10-CM | POA: Diagnosis not present

## 2017-10-10 DIAGNOSIS — F39 Unspecified mood [affective] disorder: Secondary | ICD-10-CM | POA: Diagnosis not present

## 2017-10-10 DIAGNOSIS — J301 Allergic rhinitis due to pollen: Secondary | ICD-10-CM | POA: Diagnosis not present

## 2017-11-21 DIAGNOSIS — F39 Unspecified mood [affective] disorder: Secondary | ICD-10-CM | POA: Diagnosis not present

## 2017-12-05 DIAGNOSIS — J3089 Other allergic rhinitis: Secondary | ICD-10-CM | POA: Diagnosis not present

## 2017-12-05 DIAGNOSIS — F39 Unspecified mood [affective] disorder: Secondary | ICD-10-CM | POA: Diagnosis not present

## 2017-12-05 DIAGNOSIS — J3081 Allergic rhinitis due to animal (cat) (dog) hair and dander: Secondary | ICD-10-CM | POA: Diagnosis not present

## 2017-12-12 DIAGNOSIS — J3089 Other allergic rhinitis: Secondary | ICD-10-CM | POA: Diagnosis not present

## 2017-12-12 DIAGNOSIS — J3081 Allergic rhinitis due to animal (cat) (dog) hair and dander: Secondary | ICD-10-CM | POA: Diagnosis not present

## 2017-12-19 DIAGNOSIS — F39 Unspecified mood [affective] disorder: Secondary | ICD-10-CM | POA: Diagnosis not present

## 2017-12-19 DIAGNOSIS — J3081 Allergic rhinitis due to animal (cat) (dog) hair and dander: Secondary | ICD-10-CM | POA: Diagnosis not present

## 2017-12-19 DIAGNOSIS — J3089 Other allergic rhinitis: Secondary | ICD-10-CM | POA: Diagnosis not present

## 2018-01-02 DIAGNOSIS — J3089 Other allergic rhinitis: Secondary | ICD-10-CM | POA: Diagnosis not present

## 2018-01-02 DIAGNOSIS — J3081 Allergic rhinitis due to animal (cat) (dog) hair and dander: Secondary | ICD-10-CM | POA: Diagnosis not present

## 2018-01-09 DIAGNOSIS — J3081 Allergic rhinitis due to animal (cat) (dog) hair and dander: Secondary | ICD-10-CM | POA: Diagnosis not present

## 2018-01-09 DIAGNOSIS — J3089 Other allergic rhinitis: Secondary | ICD-10-CM | POA: Diagnosis not present

## 2018-01-09 DIAGNOSIS — F39 Unspecified mood [affective] disorder: Secondary | ICD-10-CM | POA: Diagnosis not present

## 2018-01-16 DIAGNOSIS — J3081 Allergic rhinitis due to animal (cat) (dog) hair and dander: Secondary | ICD-10-CM | POA: Diagnosis not present

## 2018-01-16 DIAGNOSIS — J301 Allergic rhinitis due to pollen: Secondary | ICD-10-CM | POA: Diagnosis not present

## 2018-01-23 DIAGNOSIS — F39 Unspecified mood [affective] disorder: Secondary | ICD-10-CM | POA: Diagnosis not present

## 2018-01-30 DIAGNOSIS — Z125 Encounter for screening for malignant neoplasm of prostate: Secondary | ICD-10-CM | POA: Diagnosis not present

## 2018-01-30 DIAGNOSIS — I10 Essential (primary) hypertension: Secondary | ICD-10-CM | POA: Diagnosis not present

## 2018-01-30 DIAGNOSIS — F322 Major depressive disorder, single episode, severe without psychotic features: Secondary | ICD-10-CM | POA: Diagnosis not present

## 2018-01-30 DIAGNOSIS — E782 Mixed hyperlipidemia: Secondary | ICD-10-CM | POA: Diagnosis not present

## 2018-01-30 DIAGNOSIS — Z23 Encounter for immunization: Secondary | ICD-10-CM | POA: Diagnosis not present

## 2018-02-06 DIAGNOSIS — J3089 Other allergic rhinitis: Secondary | ICD-10-CM | POA: Diagnosis not present

## 2018-02-06 DIAGNOSIS — J3081 Allergic rhinitis due to animal (cat) (dog) hair and dander: Secondary | ICD-10-CM | POA: Diagnosis not present

## 2018-02-13 DIAGNOSIS — F39 Unspecified mood [affective] disorder: Secondary | ICD-10-CM | POA: Diagnosis not present

## 2018-02-20 DIAGNOSIS — J3089 Other allergic rhinitis: Secondary | ICD-10-CM | POA: Diagnosis not present

## 2018-02-20 DIAGNOSIS — J3081 Allergic rhinitis due to animal (cat) (dog) hair and dander: Secondary | ICD-10-CM | POA: Diagnosis not present

## 2018-02-27 DIAGNOSIS — F39 Unspecified mood [affective] disorder: Secondary | ICD-10-CM | POA: Diagnosis not present

## 2018-03-13 DIAGNOSIS — J3089 Other allergic rhinitis: Secondary | ICD-10-CM | POA: Diagnosis not present

## 2018-03-13 DIAGNOSIS — J3081 Allergic rhinitis due to animal (cat) (dog) hair and dander: Secondary | ICD-10-CM | POA: Diagnosis not present

## 2018-03-27 DIAGNOSIS — J3081 Allergic rhinitis due to animal (cat) (dog) hair and dander: Secondary | ICD-10-CM | POA: Diagnosis not present

## 2018-03-27 DIAGNOSIS — J3089 Other allergic rhinitis: Secondary | ICD-10-CM | POA: Diagnosis not present

## 2018-03-27 DIAGNOSIS — F39 Unspecified mood [affective] disorder: Secondary | ICD-10-CM | POA: Diagnosis not present

## 2018-04-10 DIAGNOSIS — J3081 Allergic rhinitis due to animal (cat) (dog) hair and dander: Secondary | ICD-10-CM | POA: Diagnosis not present

## 2018-04-10 DIAGNOSIS — F39 Unspecified mood [affective] disorder: Secondary | ICD-10-CM | POA: Diagnosis not present

## 2018-04-10 DIAGNOSIS — J3089 Other allergic rhinitis: Secondary | ICD-10-CM | POA: Diagnosis not present

## 2018-04-24 DIAGNOSIS — J3089 Other allergic rhinitis: Secondary | ICD-10-CM | POA: Diagnosis not present

## 2018-04-24 DIAGNOSIS — R062 Wheezing: Secondary | ICD-10-CM | POA: Diagnosis not present

## 2018-04-24 DIAGNOSIS — J3081 Allergic rhinitis due to animal (cat) (dog) hair and dander: Secondary | ICD-10-CM | POA: Diagnosis not present

## 2018-04-26 DIAGNOSIS — J3089 Other allergic rhinitis: Secondary | ICD-10-CM | POA: Diagnosis not present

## 2018-04-26 DIAGNOSIS — J3081 Allergic rhinitis due to animal (cat) (dog) hair and dander: Secondary | ICD-10-CM | POA: Diagnosis not present

## 2018-05-01 DIAGNOSIS — F39 Unspecified mood [affective] disorder: Secondary | ICD-10-CM | POA: Diagnosis not present

## 2018-05-15 DIAGNOSIS — J3081 Allergic rhinitis due to animal (cat) (dog) hair and dander: Secondary | ICD-10-CM | POA: Diagnosis not present

## 2018-05-15 DIAGNOSIS — J3089 Other allergic rhinitis: Secondary | ICD-10-CM | POA: Diagnosis not present

## 2018-06-12 DIAGNOSIS — F39 Unspecified mood [affective] disorder: Secondary | ICD-10-CM | POA: Diagnosis not present

## 2018-06-12 DIAGNOSIS — J3081 Allergic rhinitis due to animal (cat) (dog) hair and dander: Secondary | ICD-10-CM | POA: Diagnosis not present

## 2018-06-12 DIAGNOSIS — J3089 Other allergic rhinitis: Secondary | ICD-10-CM | POA: Diagnosis not present

## 2018-06-26 ENCOUNTER — Ambulatory Visit (INDEPENDENT_AMBULATORY_CARE_PROVIDER_SITE_OTHER): Payer: BLUE CROSS/BLUE SHIELD | Admitting: Family Medicine

## 2018-06-26 ENCOUNTER — Encounter (INDEPENDENT_AMBULATORY_CARE_PROVIDER_SITE_OTHER): Payer: Self-pay | Admitting: Family Medicine

## 2018-06-26 VITALS — BP 146/98 | HR 124 | Ht 67.0 in | Wt 253.5 lb

## 2018-06-26 DIAGNOSIS — R1031 Right lower quadrant pain: Secondary | ICD-10-CM

## 2018-06-26 DIAGNOSIS — E785 Hyperlipidemia, unspecified: Secondary | ICD-10-CM | POA: Diagnosis not present

## 2018-06-26 DIAGNOSIS — J302 Other seasonal allergic rhinitis: Secondary | ICD-10-CM

## 2018-06-26 DIAGNOSIS — Z0000 Encounter for general adult medical examination without abnormal findings: Secondary | ICD-10-CM | POA: Diagnosis not present

## 2018-06-26 DIAGNOSIS — I10 Essential (primary) hypertension: Secondary | ICD-10-CM | POA: Diagnosis not present

## 2018-06-26 DIAGNOSIS — F339 Major depressive disorder, recurrent, unspecified: Secondary | ICD-10-CM

## 2018-06-26 DIAGNOSIS — J3089 Other allergic rhinitis: Secondary | ICD-10-CM | POA: Diagnosis not present

## 2018-06-26 DIAGNOSIS — J3081 Allergic rhinitis due to animal (cat) (dog) hair and dander: Secondary | ICD-10-CM | POA: Diagnosis not present

## 2018-06-26 DIAGNOSIS — F39 Unspecified mood [affective] disorder: Secondary | ICD-10-CM | POA: Diagnosis not present

## 2018-06-26 MED ORDER — METHOCARBAMOL 750 MG PO TABS: 750.0000 mg | ORAL_TABLET | Freq: Four times a day (QID) | ORAL | 3 refills | Status: DC | PRN

## 2018-06-26 MED ORDER — GEMFIBROZIL 600 MG PO TABS: 600.0000 mg | ORAL_TABLET | Freq: Two times a day (BID) | ORAL | 3 refills | Status: DC

## 2018-06-26 MED ORDER — LISINOPRIL 10 MG PO TABS: 10.0000 mg | ORAL_TABLET | Freq: Every day | ORAL | 3 refills | Status: DC

## 2018-06-26 MED ORDER — DULOXETINE HCL 60 MG PO CPEP: 60.0000 mg | ORAL_CAPSULE | Freq: Every day | ORAL | 3 refills | Status: DC

## 2018-06-26 NOTE — Progress Notes (Signed)
   Office Visit Note   Patient: Darren Hamilton           Date of Birth: 1961/06/05           MRN: [id number] Visit Date: 06/26/2018 Requested by: Blair Heys, MD [address] [place], [place] [address] PCP: Blair Heys, MD  Subjective: Chief Complaint  Patient presents with  . Right Knee - Pain  . right groin pain  . medication refills    HPI: He is here to reestablish care.  He was having right-sided groin pain for several weeks, but about 2 weeks ago he stopped having pain and continues to be pain-free today.  He never noticed a lump, but was having some pain with movement.  Hypertension has been well controlled with lisinopril 10 mg daily.  Depression is managed pretty well with Cymbalta 60 mg daily.  He tolerates Lopid for hyperlipidemia.  He gets intermittent back spasms which responded well to Robaxin.  He only needs it a few times per year.  He has seasonal allergies with occasional reactive airways, currently asymptomatic.  He had an episode of elevated PSA a couple years ago which resolved without treatment, did not need prostate biopsy.  He is due for annual wellness exam labs.                ROS: Other systems were reviewed and are negative.  Objective: Vital Signs: BP (!) 146/98 (BP Location: Left Arm, Patient Position: Sitting, Cuff Size: Large)   Pulse (!) 124   Ht 5\' 7"  (1.702 m)   Wt 253 lb 8 oz (115 kg)   BMI 39.70 kg/m   Physical Exam:  CV: Regular rate and rhythm without murmurs, rubs, or gallops.  No peripheral edema.  2+ radial and posterior tibial pulses. Lungs: Clear to auscultation throughout with no wheezing or areas of consolidation.    Imaging: None today.  Assessment & Plan: 1.  Depression, well controlled on current regimen. -Refilled Cymbalta.  Follow-up in 6 months.  2.  Hypertension, well controlled. -Refilled lisinopril.  3.  Right groin pain, resolved. -Return if symptoms worsen.  4.   Hyperlipidemia -Labs today, refilled medication.  5.  Seasonal allergies, well controlled.  6. wellness examination -Labs today.   Follow-Up Instructions: Return in about 6 months (around 12/25/2018).      Procedures: No procedures performed  No notes on file    PMFS History: Patient Active Problem List   Diagnosis Date Noted  . Seasonal allergies 06/26/2018  . Hyperlipidemia 06/26/2018  . Depression 11/09/2014   History reviewed. No pertinent past medical history.  History reviewed. No pertinent family history.  History reviewed. No pertinent surgical history. Social History   Occupational History  . Not on file  Tobacco Use  . Smoking status: Never Smoker  . Smokeless tobacco: Never Used  Substance and Sexual Activity  . Alcohol use: Yes  . Drug use: No  . Sexual activity: Not on file

## 2018-06-27 ENCOUNTER — Telehealth (INDEPENDENT_AMBULATORY_CARE_PROVIDER_SITE_OTHER): Payer: Self-pay | Admitting: Family Medicine

## 2018-06-27 LAB — COMPREHENSIVE METABOLIC PANEL
AG RATIO: 1.6 (calc) (ref 1.0–2.5)
ALT: 28 U/L (ref 9–46)
AST: 23 U/L (ref 10–35)
Albumin: 4.3 g/dL (ref 3.6–5.1)
Alkaline phosphatase (APISO): 72 U/L (ref 40–115)
BILIRUBIN TOTAL: 0.4 mg/dL (ref 0.2–1.2)
BUN: 13 mg/dL (ref 7–25)
CALCIUM: 9.3 mg/dL (ref 8.6–10.3)
CHLORIDE: 104 mmol/L (ref 98–110)
CO2: 26 mmol/L (ref 20–32)
Creat: 1.08 mg/dL (ref 0.70–1.33)
GLOBULIN: 2.7 g/dL (ref 1.9–3.7)
GLUCOSE: 102 mg/dL — AB (ref 65–99)
Potassium: 4.2 mmol/L (ref 3.5–5.3)
SODIUM: 142 mmol/L (ref 135–146)
TOTAL PROTEIN: 7 g/dL (ref 6.1–8.1)

## 2018-06-27 LAB — LIPID PANEL
CHOL/HDL RATIO: 4.9 (calc) (ref <5.0)
Cholesterol: 207 mg/dL — ABNORMAL HIGH (ref <200)
HDL: 42 mg/dL (ref >40)
LDL Cholesterol (Calc): 125 mg/dL (calc) — ABNORMAL HIGH
NON-HDL CHOLESTEROL (CALC): 165 mg/dL — AB (ref <130)
Triglycerides: 262 mg/dL — ABNORMAL HIGH (ref <150)

## 2018-06-27 LAB — THYROID PANEL WITH TSH
FREE THYROXINE INDEX: 1.7 (ref 1.4–3.8)
T3 UPTAKE: 29 % (ref 22–35)
T4 TOTAL: 5.8 ug/dL (ref 4.9–10.5)
TSH: 3.7 mIU/L (ref 0.40–4.50)

## 2018-06-27 LAB — CBC WITH DIFFERENTIAL/PLATELET
Absolute Monocytes: 585 cells/uL (ref 200–950)
Basophils Absolute: 61 cells/uL (ref 0–200)
Basophils Relative: 0.8 %
EOS PCT: 4.3 %
Eosinophils Absolute: 327 cells/uL (ref 15–500)
HCT: 42.4 % (ref 38.5–50.0)
Hemoglobin: 14.7 g/dL (ref 13.2–17.1)
Lymphs Abs: 1376 cells/uL (ref 850–3900)
MCH: 29.2 pg (ref 27.0–33.0)
MCHC: 34.7 g/dL (ref 32.0–36.0)
MCV: 84.3 fL (ref 80.0–100.0)
MPV: 9.7 fL (ref 7.5–12.5)
Monocytes Relative: 7.7 %
NEUTROS PCT: 69.1 %
Neutro Abs: 5252 cells/uL (ref 1500–7800)
PLATELETS: 347 10*3/uL (ref 140–400)
RBC: 5.03 10*6/uL (ref 4.20–5.80)
RDW: 13.7 % (ref 11.0–15.0)
TOTAL LYMPHOCYTE: 18.1 %
WBC: 7.6 10*3/uL (ref 3.8–10.8)

## 2018-06-27 LAB — HIGH SENSITIVITY CRP: hs-CRP: 8.7 mg/L — ABNORMAL HIGH

## 2018-06-27 LAB — PSA: PSA: 1.7 ng/mL (ref <=4.0)

## 2018-06-27 LAB — VITAMIN D 25 HYDROXY (VIT D DEFICIENCY, FRACTURES): VIT D 25 HYDROXY: 41 ng/mL (ref 30–100)

## 2018-06-27 NOTE — Telephone Encounter (Signed)
Labs are notable for the following:  Blood glucose is elevated in prediabetes range at 102.  In addition, C-reactive protein (inflammation marker) is elevated at 8.7.  Also, lipids are abnormal with triglycerides of 262 and HDL of 42.  When triglycerides are more than doubled the HDL, there is a much higher risk of becoming diabetic.  All of the above abnormalities will generally improve and often normalized with dietary change.  In general, it is helpful to minimize intake of processed carbohydrates including breads, pastas, cereals, sugars and sweets.  Regular exercise is also beneficial.  We should recheck these numbers in 4 to 6 months to be sure they are normalizing.  Thyroid TSH is in normal range at 3.7, but is higher than I would like to see.  I would prefer to have it closer to 1.0.  I think we should recheck this in 4 to 6 months as well.  Vitamin D looks good at 41.  We can mail him a copy of the labs if he would like, or he can view them through MyChart.

## 2018-06-27 NOTE — Telephone Encounter (Signed)
Left message on patient's mobile voice mail to call back to let me know if he would like me to just mail the lab results and Dr. Prince RomeHilts' message (about the results) to him or if he would like me to go over all of it with him by phone - not on MyChart yet.

## 2018-07-02 NOTE — Telephone Encounter (Signed)
Mailed copy of lab tests with the results/instructions from Dr. Prince Rome to the patient's home address.

## 2018-07-10 DIAGNOSIS — J3089 Other allergic rhinitis: Secondary | ICD-10-CM | POA: Diagnosis not present

## 2018-07-10 DIAGNOSIS — F39 Unspecified mood [affective] disorder: Secondary | ICD-10-CM | POA: Diagnosis not present

## 2018-07-10 DIAGNOSIS — J3081 Allergic rhinitis due to animal (cat) (dog) hair and dander: Secondary | ICD-10-CM | POA: Diagnosis not present

## 2018-07-24 DIAGNOSIS — L821 Other seborrheic keratosis: Secondary | ICD-10-CM | POA: Diagnosis not present

## 2018-07-24 DIAGNOSIS — J3081 Allergic rhinitis due to animal (cat) (dog) hair and dander: Secondary | ICD-10-CM | POA: Diagnosis not present

## 2018-07-24 DIAGNOSIS — D225 Melanocytic nevi of trunk: Secondary | ICD-10-CM | POA: Diagnosis not present

## 2018-07-24 DIAGNOSIS — J3089 Other allergic rhinitis: Secondary | ICD-10-CM | POA: Diagnosis not present

## 2018-07-24 DIAGNOSIS — L820 Inflamed seborrheic keratosis: Secondary | ICD-10-CM | POA: Diagnosis not present

## 2018-07-24 DIAGNOSIS — L718 Other rosacea: Secondary | ICD-10-CM | POA: Diagnosis not present

## 2018-07-24 DIAGNOSIS — F39 Unspecified mood [affective] disorder: Secondary | ICD-10-CM | POA: Diagnosis not present

## 2018-08-07 DIAGNOSIS — F39 Unspecified mood [affective] disorder: Secondary | ICD-10-CM | POA: Diagnosis not present

## 2018-08-21 DIAGNOSIS — F39 Unspecified mood [affective] disorder: Secondary | ICD-10-CM | POA: Diagnosis not present

## 2018-09-04 DIAGNOSIS — F39 Unspecified mood [affective] disorder: Secondary | ICD-10-CM | POA: Diagnosis not present

## 2018-09-10 DIAGNOSIS — J3089 Other allergic rhinitis: Secondary | ICD-10-CM | POA: Diagnosis not present

## 2018-09-10 DIAGNOSIS — J3081 Allergic rhinitis due to animal (cat) (dog) hair and dander: Secondary | ICD-10-CM | POA: Diagnosis not present

## 2018-09-18 DIAGNOSIS — F39 Unspecified mood [affective] disorder: Secondary | ICD-10-CM | POA: Diagnosis not present

## 2018-10-02 DIAGNOSIS — J3081 Allergic rhinitis due to animal (cat) (dog) hair and dander: Secondary | ICD-10-CM | POA: Diagnosis not present

## 2018-10-02 DIAGNOSIS — J3089 Other allergic rhinitis: Secondary | ICD-10-CM | POA: Diagnosis not present

## 2018-10-02 DIAGNOSIS — F39 Unspecified mood [affective] disorder: Secondary | ICD-10-CM | POA: Diagnosis not present

## 2018-11-13 DIAGNOSIS — F39 Unspecified mood [affective] disorder: Secondary | ICD-10-CM | POA: Diagnosis not present

## 2018-11-27 DIAGNOSIS — F39 Unspecified mood [affective] disorder: Secondary | ICD-10-CM | POA: Diagnosis not present

## 2018-12-11 DIAGNOSIS — F39 Unspecified mood [affective] disorder: Secondary | ICD-10-CM | POA: Diagnosis not present

## 2018-12-25 ENCOUNTER — Ambulatory Visit (INDEPENDENT_AMBULATORY_CARE_PROVIDER_SITE_OTHER): Payer: BC Managed Care – PPO | Admitting: Family Medicine

## 2018-12-25 ENCOUNTER — Encounter: Payer: Self-pay | Admitting: Family Medicine

## 2018-12-25 VITALS — BP 151/99 | HR 111

## 2018-12-25 DIAGNOSIS — Z20828 Contact with and (suspected) exposure to other viral communicable diseases: Secondary | ICD-10-CM

## 2018-12-25 DIAGNOSIS — R7982 Elevated C-reactive protein (CRP): Secondary | ICD-10-CM | POA: Diagnosis not present

## 2018-12-25 DIAGNOSIS — I10 Essential (primary) hypertension: Secondary | ICD-10-CM

## 2018-12-25 DIAGNOSIS — Z20822 Contact with and (suspected) exposure to covid-19: Secondary | ICD-10-CM

## 2018-12-25 DIAGNOSIS — R7989 Other specified abnormal findings of blood chemistry: Secondary | ICD-10-CM

## 2018-12-25 DIAGNOSIS — R0683 Snoring: Secondary | ICD-10-CM

## 2018-12-25 DIAGNOSIS — R739 Hyperglycemia, unspecified: Secondary | ICD-10-CM | POA: Diagnosis not present

## 2018-12-25 DIAGNOSIS — E785 Hyperlipidemia, unspecified: Secondary | ICD-10-CM

## 2018-12-25 DIAGNOSIS — F39 Unspecified mood [affective] disorder: Secondary | ICD-10-CM | POA: Diagnosis not present

## 2018-12-25 DIAGNOSIS — F339 Major depressive disorder, recurrent, unspecified: Secondary | ICD-10-CM

## 2018-12-25 MED ORDER — DULOXETINE HCL 30 MG PO CPEP: 30.0000 mg | ORAL_CAPSULE | Freq: Every day | ORAL | 3 refills | Status: DC

## 2018-12-25 MED ORDER — ESCITALOPRAM OXALATE 20 MG PO TABS: ORAL_TABLET | ORAL | 3 refills | Status: DC

## 2018-12-25 MED ORDER — ALBUTEROL SULFATE HFA 108 (90 BASE) MCG/ACT IN AERS: 2.0000 | INHALATION_SPRAY | Freq: Four times a day (QID) | RESPIRATORY_TRACT | 11 refills | Status: DC | PRN

## 2018-12-25 MED ORDER — SERTRALINE HCL 50 MG PO TABS: ORAL_TABLET | ORAL | 3 refills | Status: DC

## 2018-12-25 NOTE — Progress Notes (Signed)
Office Visit Note   Patient: Darren Hamilton           Date of Birth: 03/28/1961           MRN: [id number] Visit Date: 12/25/2018 Requested by: Gaynelle Arabian, MD [address] [place],  [place] [address] PCP: Gaynelle Arabian, MD  Subjective: Chief Complaint  Patient presents with  . 6 months followup    HPI: He is here for routine monitoring of medical conditions.  From a depression standpoint he has been doing very well.  He does not feel like Cymbalta works for him any longer.  He has tried Zoloft in the past and 1 or 2 others.  He sees his therapist on a regular basis.  He is not having any suicidal thoughts, but anhedonia.  Blood pressure has been suboptimally controlled.  Asymptomatic from that standpoint.  His wife thinks he might have sleep apnea.  He snores quite a bit and seems to stop breathing when sleeping.  He feels tired much of the time but it could be related to his depression.  He thinks he was exposed to COVID-19 in February.  He also had a few days of flulike symptoms around that time.  He would like to have antibiotic testing.  He is due for labs to monitor hyperlipidemia, elevated C-reactive protein, elevated TSH.                 ROS: No fevers or chills.  All other systems were reviewed and are negative.  Objective: Vital Signs: BP (!) 151/99 (BP Location: Left Arm, Patient Position: Sitting, Cuff Size: Large)   Pulse (!) 111   Physical Exam:  General:  Alert and oriented, in no acute distress. Pulm:  Breathing unlabored. Psy:  Normal mood, congruent affect.  HEENT:  [place]/AT, PERRLA, EOM Full, no nystagmus.  Funduscopic examination within normal limits.  No conjunctival erythema.  Tympanic membranes are pearly gray with normal landmarks.  External ear canals are normal.  Nasal passages are clear.  Oropharynx is clear.  No significant lymphadenopathy.  No thyromegaly or nodules.  2+ carotid pulses without bruits. CV: Regular rate and rhythm  without murmurs, rubs, or gallops.  No peripheral edema.  2+ radial and posterior tibial pulses. Lungs: Clear to auscultation throughout with no wheezing or areas of consolidation.    Imaging: None today.  Assessment & Plan: 1.  Major depression, not doing well with Cymbalta. -We will decrease dosage to 30 mg for 2 weeks and then stop.  During that time we will initiate Lexapro.  He will let me know how he is doing.  2.  Possible sleep apnea - Home sleep study ordered.  3.  Hypertension, suboptimally controlled. -Could be related to sleep apnea.  We will continue to monitor.  4.  Possible COVID-19 exposure -Antibody testing today.  5.  Hyperlipidemia, elevated C-reactive protein, elevated TSH. -Labs today.  Follow-up in 6 months.     Procedures: No procedures performed  No notes on file     PMFS History: Patient Active Problem List   Diagnosis Date Noted  . Seasonal allergies 06/26/2018  . Hyperlipidemia 06/26/2018  . Essential hypertension 06/26/2018  . Depression 11/09/2014   History reviewed. No pertinent past medical history.  History reviewed. No pertinent family history.  History reviewed. No pertinent surgical history. Social History   Occupational History  . Not on file  Tobacco Use  . Smoking status: Never Smoker  . Smokeless tobacco: Never Used  Substance and Sexual Activity  . Alcohol use: Yes  . Drug use: No  . Sexual activity: Not on file

## 2018-12-27 LAB — LIPID PANEL
Cholesterol: 265 mg/dL — ABNORMAL HIGH (ref <200)
HDL: 52 mg/dL (ref >=40)
Non-HDL Cholesterol (Calc): 213 mg/dL (calc) — ABNORMAL HIGH (ref <130)
Total CHOL/HDL Ratio: 5.1 (calc) — ABNORMAL HIGH (ref <5.0)
Triglycerides: 419 mg/dL — ABNORMAL HIGH (ref <150)

## 2018-12-27 LAB — COMPREHENSIVE METABOLIC PANEL
AG Ratio: 1.4 (calc) (ref 1.0–2.5)
ALT: 22 U/L (ref 9–46)
AST: 18 U/L (ref 10–35)
Albumin: 3.8 g/dL (ref 3.6–5.1)
Alkaline phosphatase (APISO): 64 U/L (ref 35–144)
BUN: 17 mg/dL (ref 7–25)
CO2: 24 mmol/L (ref 20–32)
Calcium: 9.5 mg/dL (ref 8.6–10.3)
Chloride: 103 mmol/L (ref 98–110)
Creat: 1.05 mg/dL (ref 0.70–1.33)
Globulin: 2.7 g/dL (calc) (ref 1.9–3.7)
Glucose, Bld: 97 mg/dL (ref 65–99)
Potassium: 4.1 mmol/L (ref 3.5–5.3)
Sodium: 139 mmol/L (ref 135–146)
Total Bilirubin: 0.4 mg/dL (ref 0.2–1.2)
Total Protein: 6.5 g/dL (ref 6.1–8.1)

## 2018-12-27 LAB — THYROID PANEL WITH TSH
Free Thyroxine Index: 1.8 (ref 1.4–3.8)
T3 Uptake: 31 % (ref 22–35)
T4, Total: 5.7 ug/dL (ref 4.9–10.5)
TSH: 3.22 mIU/L (ref 0.40–4.50)

## 2018-12-27 LAB — CBC WITH DIFFERENTIAL/PLATELET
Absolute Monocytes: 568 cells/uL (ref 200–950)
Basophils Absolute: 50 cells/uL (ref 0–200)
Basophils Relative: 0.7 %
Eosinophils Absolute: 263 cells/uL (ref 15–500)
Eosinophils Relative: 3.7 %
HCT: 43.8 % (ref 38.5–50.0)
Hemoglobin: 14.8 g/dL (ref 13.2–17.1)
Lymphs Abs: 1732 cells/uL (ref 850–3900)
MCH: 30.6 pg (ref 27.0–33.0)
MCHC: 33.8 g/dL (ref 32.0–36.0)
MCV: 90.5 fL (ref 80.0–100.0)
MPV: 9.1 fL (ref 7.5–12.5)
Monocytes Relative: 8 %
Neutro Abs: 4487 cells/uL (ref 1500–7800)
Neutrophils Relative %: 63.2 %
Platelets: 322 10*3/uL (ref 140–400)
RBC: 4.84 10*6/uL (ref 4.20–5.80)
RDW: 14.2 % (ref 11.0–15.0)
Total Lymphocyte: 24.4 %
WBC: 7.1 10*3/uL (ref 3.8–10.8)

## 2018-12-27 LAB — HIGH SENSITIVITY CRP: hs-CRP: 8.6 mg/L — ABNORMAL HIGH

## 2018-12-27 LAB — HEMOGLOBIN A1C
Hgb A1c MFr Bld: 6.1 % of total Hgb — ABNORMAL HIGH (ref <5.7)
Mean Plasma Glucose: 128 (calc)
eAG (mmol/L): 7.1 (calc)

## 2018-12-30 ENCOUNTER — Telehealth: Payer: Self-pay | Admitting: Family Medicine

## 2018-12-30 NOTE — Telephone Encounter (Signed)
Labs show:  A1C is definitely in prediabetes range at 6.1.  In addition, lipids are even worse than before.  It is very important to start exercising regularly and minimizing intake of breads; pastas; cereals; sugars/sweets.  Would recheck in 3-4 months, and if not improving, we may need to consider medication.  CRP is still elevated, but I think it will improve with lifestyle changes mentioned above.  All else looks normal.  Can mail him a copy if he'd like.

## 2019-01-03 NOTE — Telephone Encounter (Signed)
I advised the patient of his lab results and lifestyle changes needed per Dr. Junius Roads. He will sign on to MyChart to get the test results. Recheck labs in 3-4 months. He said Zoloft was filled when he picked up his medications from the pharmacy (he had been on this a long time ago). Dr. Junius Roads sent in Duloxetine and Escitalopram only, so unsure how/why the zoloft was filled. He will need to check with the pharmacy about that.

## 2019-01-15 ENCOUNTER — Telehealth: Payer: Self-pay | Admitting: Family Medicine

## 2019-01-15 NOTE — Telephone Encounter (Signed)
I left a full message on the patient's voice mail: I have checked with our referral coordinator about the sleep study. She will be in touch with the patient about this. As far as the lab goes, he can go to [facility], [facility] or whichever lab he chooses without an order. He can also try donating blood, as they will automatically screen his blood for the antibodies. I advised him to give me a call back if he has any further questions on this.

## 2019-01-15 NOTE — Telephone Encounter (Signed)
Both tests were previously ordered.  Not sure who is supposed to contact him regarding the sleep test?  Can he go to any [facility] lab for the antibody test?  Or can we fax order to [place] or [place]?

## 2019-01-15 NOTE — Telephone Encounter (Signed)
Please advise 

## 2019-01-15 NOTE — Telephone Encounter (Signed)
Pt called in said he has not heard from anyone about is sleep study test that dr.hilts was going to have him do. Also hasn't heard anything about his Covid antibody test. Please give him a call [phone]

## 2019-01-16 ENCOUNTER — Other Ambulatory Visit: Payer: Self-pay | Admitting: Family Medicine

## 2019-01-16 ENCOUNTER — Telehealth: Payer: Self-pay | Admitting: *Deleted

## 2019-01-16 NOTE — Telephone Encounter (Signed)
-----   Message from Marlyne Beards, [place] sent at 01/15/2019  4:22 PM EDT ----- Dr. Junius Roads ordered a home sleep test for the patient on 12/25/18. It is not listed under referrals, but is listed under procedures, for some reason. Will you please check on this? I will call the patient and let him know it is in the works.

## 2019-01-16 NOTE — Addendum Note (Signed)
Addended by: Daylene Posey T on: 01/16/2019 09:00 AM   Modules accepted: Orders

## 2019-01-16 NOTE — Telephone Encounter (Signed)
Order has been sent to [facility], they will contact pt to schedule appt

## 2019-01-22 DIAGNOSIS — F39 Unspecified mood [affective] disorder: Secondary | ICD-10-CM | POA: Diagnosis not present

## 2019-01-30 ENCOUNTER — Encounter: Payer: Self-pay | Admitting: Family Medicine

## 2019-01-31 ENCOUNTER — Other Ambulatory Visit: Payer: Self-pay | Admitting: *Deleted

## 2019-01-31 DIAGNOSIS — R0683 Snoring: Secondary | ICD-10-CM

## 2019-02-03 ENCOUNTER — Encounter: Payer: Self-pay | Admitting: Family Medicine

## 2019-02-03 DIAGNOSIS — R600 Localized edema: Secondary | ICD-10-CM

## 2019-02-05 ENCOUNTER — Ambulatory Visit (INDEPENDENT_AMBULATORY_CARE_PROVIDER_SITE_OTHER): Payer: BC Managed Care – PPO | Admitting: Neurology

## 2019-02-05 ENCOUNTER — Other Ambulatory Visit: Payer: Self-pay

## 2019-02-05 ENCOUNTER — Encounter: Payer: Self-pay | Admitting: Neurology

## 2019-02-05 VITALS — BP 124/89 | HR 107 | Ht 68.0 in | Wt 270.0 lb

## 2019-02-05 DIAGNOSIS — R51 Headache: Secondary | ICD-10-CM

## 2019-02-05 DIAGNOSIS — R0681 Apnea, not elsewhere classified: Secondary | ICD-10-CM

## 2019-02-05 DIAGNOSIS — R600 Localized edema: Secondary | ICD-10-CM

## 2019-02-05 DIAGNOSIS — R0683 Snoring: Secondary | ICD-10-CM

## 2019-02-05 DIAGNOSIS — R519 Headache, unspecified: Secondary | ICD-10-CM

## 2019-02-05 DIAGNOSIS — R351 Nocturia: Secondary | ICD-10-CM

## 2019-02-05 DIAGNOSIS — Z9189 Other specified personal risk factors, not elsewhere classified: Secondary | ICD-10-CM

## 2019-02-05 DIAGNOSIS — G4719 Other hypersomnia: Secondary | ICD-10-CM | POA: Diagnosis not present

## 2019-02-05 DIAGNOSIS — Z6841 Body Mass Index (BMI) 40.0 and over, adult: Secondary | ICD-10-CM

## 2019-02-05 NOTE — Patient Instructions (Signed)

## 2019-02-05 NOTE — Progress Notes (Signed)
Subjective:    Patient ID: Darren Hamilton is a 58 y.o. male.  HPI     Darren Foley, MD, PhD [facility] [address] [address] [place], [place] [address]  Dear Dr. Prince Hamilton,   I saw your patient, Darren Hamilton, upon your kind request to my sleep clinic today for initial consultation of his sleep disorder, in particular, concern for underlying obstructive sleep apnea.  The patient is unaccompanied today.  As you know, Darren Hamilton is a 58 year old right-handed gentleman with an underlying medical history of hypertension, hyperlipidemia, depression and obesity, who reports snoring, excessive daytime sleepiness and witnessed apneas per wife's report.  I reviewed your office note from 12/25/2018. His Epworth sleepiness score is 19 out of 24, fatigue severity score is 54 out of 63.  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  His bedtime is generally around 11 but sometimes later than that.  His rise time is generally around 7 AM.  He lives with his wife and 37 year old son who has autism.  He has a TV in the bedroom but they hardly use it.  They have 2 cats in the household who do not bother them at night.  He has recently noticed lower extremity edema.  He has been referred for an echocardiogram.  He had a stress test several years ago which was benign per his report.  He has gained over the past several years.  He has rare morning headaches, nocturia about once per average night, admits to drinking quite a bit of caffeine in the form of coffee, about a 8 cup pot per day and 1 serving of tea.  He does not have any telltale symptoms of restless leg syndrome but is a restless sleeper.  His wife has commented on his loud snoring which is worse when he is on his back.  He tries to sleep on his sides.  He is not aware of any family history of sleep apnea but suspects that his mom may have had it, she was a loud snorer, she passed away recently at age 39, father passed away  from complications of heart disease at age 58.   His Past Medical History Is Significant For: No past medical history on file.  His Past Surgical History Is Significant For: No past surgical history on file.  His Family History Is Significant For: No family history on file.  His Social History Is Significant For: Social History   Socioeconomic History  . Marital status: Single    Spouse name: Not on file  . Number of children: Not on file  . Years of education: Not on file  . Highest education level: Not on file  Occupational History  . Not on file  Social Needs  . Financial resource strain: Not on file  . Food insecurity    Worry: Not on file    Inability: Not on file  . Transportation needs    Medical: Not on file    Non-medical: Not on file  Tobacco Use  . Smoking status: Never Smoker  . Smokeless tobacco: Never Used  Substance and Sexual Activity  . Alcohol use: Yes  . Drug use: No  . Sexual activity: Not on file  Lifestyle  . Physical activity    Days per week: Not on file    Minutes per session: Not on file  . Stress: Not on file  Relationships  . Social connections    Talks on phone: Not on  file    Gets together: Not on file    Attends religious service: Not on file    Active member of club or organization: Not on file    Attends meetings of clubs or organizations: Not on file    Relationship status: Not on file  Other Topics Concern  . Not on file  Social History Narrative  . Not on file    His Allergies Are:  No Known Allergies:   His Current Medications Are:  Outpatient Encounter Medications as of 02/05/2019  Medication Sig  . acetaminophen (TYLENOL) 325 MG tablet Take 650 mg by mouth every 6 (six) hours as needed.  Marland Kitchen. albuterol (VENTOLIN HFA) 108 (90 Base) MCG/ACT inhaler Inhale 2 puffs into the lungs every 6 (six) hours as needed for wheezing or shortness of breath.  Marland Kitchen. aspirin 81 MG tablet Take 81 mg by mouth daily.  . cetirizine (ZYRTEC) 10  MG tablet Take 10 mg by mouth daily.  . cholecalciferol (VITAMIN D) 25 MCG (1000 UT) tablet Take 1,000 Units by mouth daily.  . clobetasol ointment (TEMOVATE) 0.05 % PLEASE SEE ATTACHED FOR DETAILED DIRECTIONS  . escitalopram (LEXAPRO) 20 MG tablet Start 1/2 tab PO qd, may increase to 1 PO qd after 1 week  . fluticasone (FLONASE) 50 MCG/ACT nasal spray Place 2 sprays into both nostrils daily.  Marland Kitchen. gemfibrozil (LOPID) 600 MG tablet Take 1 tablet (600 mg total) by mouth 2 (two) times daily before a meal.  . lisinopril (PRINIVIL,ZESTRIL) 10 MG tablet Take 1 tablet (10 mg total) by mouth daily.  . methocarbamol (ROBAXIN) 750 MG tablet Take 750 mg by mouth every 8 (eight) hours as needed for muscle spasms.  . Multiple Vitamin (MULTIVITAMIN) tablet Take 1 tablet by mouth daily.  . naproxen sodium (ALEVE) 220 MG tablet Take 220 mg by mouth.  . Olopatadine HCl (PATANASE) 0.6 % SOLN Place into the nose 2 (two) times daily.  . [DISCONTINUED] DULoxetine (CYMBALTA) 30 MG capsule Take 1 capsule (30 mg total) by mouth daily.   No facility-administered encounter medications on file as of 02/05/2019.   :  Review of Systems:  Out of a complete 14 point review of systems, all are reviewed and negative with the exception of these symptoms as listed below: Review of Systems  Neurological:       Pt presents today to discuss his sleep. Pt has never had a sleep study but does endorse snoring.  Epworth Sleepiness Scale 0= would never doze 1= slight chance of dozing 2= moderate chance of dozing 3= high chance of dozing  Sitting and reading: 3 Watching TV: 2 Sitting inactive in a public place (ex. Theater or meeting): 3 As a passenger in a car for an hour without a break: 3 Lying down to rest in the afternoon: 3 Sitting and talking to someone: 1 Sitting quietly after lunch (no alcohol): 3 In a car, while stopped in traffic: 1 Total: 19     Objective:  Neurological Exam  Physical Exam Physical  Examination:   Vitals:   02/05/19 1526  BP: 124/89  Pulse: (!) 107    General Examination: The patient is a very pleasant 58 y.o. male in no acute distress. He appears well-developed and well-nourished and well groomed.   HEENT: Normocephalic, atraumatic, pupils are equal, round and reactive to light and accommodation. Funduscopic exam is normal with sharp disc margins noted. Extraocular tracking is good without limitation to gaze excursion or nystagmus noted. Normal smooth pursuit is noted.  Hearing is grossly intact. Tympanic membranes are clear bilaterally. Face is symmetric with normal facial animation and normal facial sensation. Speech is clear with no dysarthria noted. There is no hypophonia. There is no lip, neck/head, jaw or voice tremor. Neck is supple with full range of passive and active motion. There are no carotid bruits on auscultation. Oropharynx exam reveals: mild mouth dryness, adequate dental hygiene and moderate airway crowding, due to Tonsillar size of about 1+, larger uvula, thicker tongue noted.  Mallampati is class II.  Neck circumference is 20-7/8 inches.  He has a full beard.  Tongue protrudes centrally in palate elevates symmetrically.  He has a minimal overbite.   Chest: Clear to auscultation without wheezing, rhonchi or crackles noted.  Heart: S1+S2+0, regular and normal without murmurs, rubs or gallops noted.   Abdomen: Soft, non-tender and non-distended with normal bowel sounds appreciated on auscultation.  Extremities: There is 1+ pitting edema in the distal lower extremities bilaterally. Pedal pulses are intact.  Skin: Warm and dry without trophic changes noted. There are no varicose veins.  Musculoskeletal: exam reveals no obvious joint deformities, tenderness or joint swelling or erythema.   Neurologically:  Mental status: The patient is awake, alert and oriented in all 4 spheres. His immediate and remote memory, attention, language skills and fund of  knowledge are appropriate. There is no evidence of aphasia, agnosia, apraxia or anomia. Speech is clear with normal prosody and enunciation. Thought process is linear. Mood is normal and affect is normal.  Cranial nerves II - XII are as described above under HEENT exam. In addition: shoulder shrug is normal with equal shoulder height noted. Motor exam: Normal bulk, strength and tone is noted. There is tremor. Fine motor skills and coordination: grossly intact.  Cerebellar testing: No dysmetria or intention tremor. There is no truncal or gait ataxia.  Sensory exam: intact to light touch.  Gait, station and balance: He stands easily. No veering to one side is noted. No leaning to one side is noted. Posture is age-appropriate and stance is narrow based. Gait shows normal stride length and normal pace. No problems turning are noted.   Assessment and Plan:    In summary, Darren Hamilton is a very pleasant 58 y.o.-year old male  with an underlying medical history of hypertension, hyperlipidemia, depression and obesity, whose history and physical exam are concerning for obstructive sleep apnea (OSA). I had a long chat with the patient about my findings and the diagnosis of OSA, its prognosis and treatment options. We talked about medical treatments, surgical interventions and non-pharmacological approaches. I explained in particular the risks and ramifications of untreated moderate to severe OSA, especially with respect to developing cardiovascular disease down the Road, including congestive heart failure, difficult to treat hypertension, cardiac arrhythmias, or stroke. Even type 2 diabetes has, in part, been linked to untreated OSA. Symptoms of untreated OSA include daytime sleepiness, memory problems, mood irritability and mood disorder such as depression and anxiety, lack of energy, as well as recurrent headaches, especially morning headaches. We talked about trying to maintain a healthy lifestyle in general, as  well as the importance of weight control. I encouraged the patient to eat healthy, exercise daily and keep well hydrated, to keep a scheduled bedtime and wake time routine, to not skip any meals and eat healthy snacks in between meals. I advised the patient not to drive when feeling sleepy. I recommended the following at this time: sleep study.   I explained the sleep test procedure  to the patient and also outlined possible surgical and non-surgical treatment options of OSA, including the use of a custom-made dental device (which would require a referral to a specialist dentist or oral surgeon), upper airway surgical options, such as pillar implants, radiofrequency surgery, tongue base surgery, and UPPP (which would involve a referral to an ENT surgeon). Rarely, jaw surgery such as mandibular advancement may be considered.  I also explained the CPAP treatment option to the patient, who indicated that he would be willing to try CPAP if the need arises. I explained the importance of being compliant with PAP treatment, not only for insurance purposes but primarily to improve His symptoms, and for the patient's long term health benefit, including to reduce His cardiovascular risks. I answered all his questions today and the patient was in agreement. I would like to see him back after the sleep study is completed and encouraged him to call with any interim questions, concerns, problems or updates.   Thank you very much for allowing me to participate in the care of this nice patient. If I can be of any further assistance to you please do not hesitate to call me at [phone].  Sincerely,   Darren FoleySaima Jaheem Hedgepath, MD, PhD

## 2019-02-19 DIAGNOSIS — F39 Unspecified mood [affective] disorder: Secondary | ICD-10-CM | POA: Diagnosis not present

## 2019-02-20 ENCOUNTER — Ambulatory Visit (INDEPENDENT_AMBULATORY_CARE_PROVIDER_SITE_OTHER): Payer: BC Managed Care – PPO | Admitting: Neurology

## 2019-02-20 DIAGNOSIS — G4733 Obstructive sleep apnea (adult) (pediatric): Secondary | ICD-10-CM

## 2019-02-20 DIAGNOSIS — R0683 Snoring: Secondary | ICD-10-CM

## 2019-02-20 DIAGNOSIS — G4719 Other hypersomnia: Secondary | ICD-10-CM

## 2019-02-20 DIAGNOSIS — R351 Nocturia: Secondary | ICD-10-CM

## 2019-02-20 DIAGNOSIS — R0681 Apnea, not elsewhere classified: Secondary | ICD-10-CM

## 2019-02-20 DIAGNOSIS — R600 Localized edema: Secondary | ICD-10-CM

## 2019-02-20 DIAGNOSIS — R519 Headache, unspecified: Secondary | ICD-10-CM

## 2019-02-20 DIAGNOSIS — Z6841 Body Mass Index (BMI) 40.0 and over, adult: Secondary | ICD-10-CM

## 2019-02-20 DIAGNOSIS — Z9189 Other specified personal risk factors, not elsewhere classified: Secondary | ICD-10-CM

## 2019-02-24 ENCOUNTER — Emergency Department (HOSPITAL_COMMUNITY)
Admission: EM | Admit: 2019-02-24 | Discharge: 2019-02-24 | Disposition: A | Discharge status: Home / Self Care | Payer: BC Managed Care – PPO | Attending: Emergency Medicine | Admitting: Emergency Medicine

## 2019-02-24 ENCOUNTER — Emergency Department (HOSPITAL_COMMUNITY): Payer: BC Managed Care – PPO

## 2019-02-24 ENCOUNTER — Other Ambulatory Visit: Payer: Self-pay

## 2019-02-24 ENCOUNTER — Emergency Department (HOSPITAL_BASED_OUTPATIENT_CLINIC_OR_DEPARTMENT_OTHER): Payer: BC Managed Care – PPO

## 2019-02-24 DIAGNOSIS — R7989 Other specified abnormal findings of blood chemistry: Secondary | ICD-10-CM

## 2019-02-24 DIAGNOSIS — I10 Essential (primary) hypertension: Secondary | ICD-10-CM | POA: Insufficient documentation

## 2019-02-24 DIAGNOSIS — M79662 Pain in left lower leg: Secondary | ICD-10-CM | POA: Diagnosis not present

## 2019-02-24 DIAGNOSIS — R600 Localized edema: Secondary | ICD-10-CM | POA: Insufficient documentation

## 2019-02-24 DIAGNOSIS — M79605 Pain in left leg: Secondary | ICD-10-CM | POA: Diagnosis not present

## 2019-02-24 DIAGNOSIS — M79609 Pain in unspecified limb: Secondary | ICD-10-CM | POA: Diagnosis not present

## 2019-02-24 DIAGNOSIS — M7989 Other specified soft tissue disorders: Secondary | ICD-10-CM | POA: Diagnosis not present

## 2019-02-24 DIAGNOSIS — R000 Tachycardia, unspecified: Secondary | ICD-10-CM | POA: Diagnosis not present

## 2019-02-24 DIAGNOSIS — R2242 Localized swelling, mass and lump, left lower limb: Secondary | ICD-10-CM | POA: Diagnosis not present

## 2019-02-24 LAB — CBC
HCT: 47 % (ref 39.0–52.0)
Hemoglobin: 16.2 g/dL (ref 13.0–17.0)
MCH: 30.6 pg (ref 26.0–34.0)
MCHC: 34.5 g/dL (ref 30.0–36.0)
MCV: 88.8 fL (ref 80.0–100.0)
Platelets: 383 10*3/uL (ref 150–400)
RBC: 5.29 MIL/uL (ref 4.22–5.81)
RDW: 13.5 % (ref 11.5–15.5)
WBC: 9.9 10*3/uL (ref 4.0–10.5)
nRBC: 0 % (ref 0.0–0.2)

## 2019-02-24 LAB — BASIC METABOLIC PANEL
Anion gap: 9 (ref 5–15)
BUN: 14 mg/dL (ref 6–20)
CO2: 22 mmol/L (ref 22–32)
Calcium: 8.8 mg/dL — ABNORMAL LOW (ref 8.9–10.3)
Chloride: 108 mmol/L (ref 98–111)
Creatinine, Ser: 1.17 mg/dL (ref 0.61–1.24)
GFR calc Af Amer: >60 mL/min (ref >60)
GFR calc non Af Amer: >60 mL/min (ref >60)
Glucose, Bld: 162 mg/dL — ABNORMAL HIGH (ref 70–99)
Potassium: 3.9 mmol/L (ref 3.5–5.1)
Sodium: 139 mmol/L (ref 135–145)

## 2019-02-24 LAB — BRAIN NATRIURETIC PEPTIDE: B Natriuretic Peptide: 1161 pg/mL — ABNORMAL HIGH (ref 0.0–100.0)

## 2019-02-24 MED ORDER — SODIUM CHLORIDE 0.9% FLUSH: 3.0000 mL | Freq: Once | INTRAVENOUS | Status: DC

## 2019-02-24 MED ORDER — FUROSEMIDE 20 MG PO TABS
20.0000 mg | ORAL_TABLET | Freq: Once | ORAL | Status: AC
  Administered 2019-02-24: 20 mg via ORAL
  Filled 2019-02-24: qty 1

## 2019-02-24 MED ORDER — FUROSEMIDE 20 MG PO TABS: 20.0000 mg | ORAL_TABLET | Freq: Every day | ORAL | 0 refills | Status: DC

## 2019-02-24 NOTE — ED Notes (Signed)
Pt resting comfortably with no changes noted.  MD to review lab results, pt does have OP ECHO scheduled for tomorrow.  Denies any needs at this time.

## 2019-02-24 NOTE — ED Notes (Signed)
Per lab ok to add on BNP to blood samples in lab

## 2019-02-24 NOTE — ED Notes (Signed)
3 weeks hx of edema to L foot with pain noted to WB this AM.  At rest pain is a 3-4 with WB an 8

## 2019-02-24 NOTE — ED Triage Notes (Signed)
Pt has had bilateral leg and abdominal swelling and weight gain x 3 weeks, being evaluated by PCP for same. Pt woke up last night with intense LLE pain and is concerned for DVT. Pain on palpation around ankle.

## 2019-02-24 NOTE — ED Provider Notes (Signed)
[facility] Emergency Department Provider Note MRN:  [id number]  Arrival date & time: 02/24/19     Chief Complaint   Leg Swelling   History of Present Illness   Darren Hamilton is a 58 y.o. year-old male with a history of hypertension, hyperlipidemia presenting to the ED with chief complaint of leg swelling.  Leg swelling that is asymmetric and worse in the left leg for the past 1 or 2 days.  Increased pain this morning, worsening throughout the day.  Pain is much worse when in a standing position.  No redness, no fever, no trauma, no chest pain or shortness of breath pain is currently 3-10, increases to 8 out of 10 when standing.  Review of Systems  A complete 10 system review of systems was obtained and all systems are negative except as noted in the HPI and PMH.   Patient's Health History   No past medical history on file.  No past surgical history on file.  No family history on file.  Social History   Socioeconomic History  . Marital status: Single    Spouse name: Not on file  . Number of children: Not on file  . Years of education: Not on file  . Highest education level: Not on file  Occupational History  . Not on file  Social Needs  . Financial resource strain: Not on file  . Food insecurity    Worry: Not on file    Inability: Not on file  . Transportation needs    Medical: Not on file    Non-medical: Not on file  Tobacco Use  . Smoking status: Never Smoker  . Smokeless tobacco: Never Used  Substance and Sexual Activity  . Alcohol use: Yes  . Drug use: No  . Sexual activity: Not on file  Lifestyle  . Physical activity    Days per week: Not on file    Minutes per session: Not on file  . Stress: Not on file  Relationships  . Social Herbalist on phone: Not on file    Gets together: Not on file    Attends religious service: Not on file    Active member of club or organization: Not on file    Attends meetings of clubs  or organizations: Not on file    Relationship status: Not on file  . Intimate partner violence    Fear of current or ex partner: Not on file    Emotionally abused: Not on file    Physically abused: Not on file    Forced sexual activity: Not on file  Other Topics Concern  . Not on file  Social History Narrative  . Not on file     Physical Exam  Vital Signs and Nursing Notes reviewed Vitals:   02/24/19 1150  BP: (!) 139/102  Pulse: (!) 129  Resp: 18  Temp: 98.8 F (37.1 C)  SpO2: 97%    CONSTITUTIONAL: Well-appearing, NAD NEURO:  Alert and oriented x 3, no focal deficits EYES:  eyes equal and reactive ENT/NECK:  no LAD, no JVD CARDIO: Regular rate, well-perfused, normal S1 and S2 PULM:  CTAB no wheezing or rhonchi GI/GU:  normal bowel sounds, non-distended, non-tender MSK/SPINE:  No gross deformities, asymmetric edema to the left leg to the level of the mid thigh, no erythema, neurovascular intact distally SKIN:  no rash, atraumatic PSYCH:  Appropriate speech and behavior  Diagnostic and Interventional Summary    EKG Interpretation  Date/Time:    Ventricular Rate:    PR Interval:    QRS Duration:   QT Interval:    QTC Calculation:   R Axis:     Text Interpretation:        Labs Reviewed  BASIC METABOLIC PANEL - Abnormal; Notable for the following components:      Result Value   Glucose, Bld 162 (*)    Calcium 8.8 (*)    All other components within normal limits  BRAIN NATRIURETIC PEPTIDE - Abnormal; Notable for the following components:   B Natriuretic Peptide 1,161.0 (*)    All other components within normal limits  CBC    DG Ankle Complete Left  Final Result    DG Chest 2 View  Final Result    VAS US LOWER EXTREMITY VENOUS (DVT) (ONLY [facility] & [facility])    (Results Pending)    Medications  sodium chloride flush (NS) 0.9 % injection 3 mL (has no administration in time range)     Procedures Critical Care  ED Course and Medical Decision Making  I have  reviewed the triage vital signs and the nursing notes.  Pertinent labs & imaging results that were available during my care of the patient were reviewed by me and considered in my medical decision making (see below for details).  58 year old male history of hypertension, hyperlipidemia here with worsening left leg pain and swelling.  Swelling began a few days ago, pain worsening this morning.  Much worse when in a standing position.  No fever, no chest pain, no shortness of breath.  Leg is without signs of infection.  Suspect DVT, will ultrasound.  Patient had a documented heart rate of 129 in triage.  This is thought to be in the setting of pain and/or ambulating.  He has a normal heart rate at this time, continues to deny chest pain or shortness of breath.  Little to no concern for PE.  Signed out to oncoming provider at shift change.  Elmer SowMichael M. Pilar PlateBero, MD [facility] [email or web address]  Final Clinical Impressions(s) / ED Diagnoses     ICD-10-CM   1. Leg edema  R60.0   2. Pain of left lower extremity  M79.605     ED Discharge Orders    None      Discharge Instructions Discussed with and Provided to Patient: Discharge Instructions   None       Sabas SousBero, Michael M, MD 02/24/19 1526

## 2019-02-24 NOTE — Progress Notes (Signed)
VASCULAR LAB PRELIMINARY  PRELIMINARY  PRELIMINARY  PRELIMINARY  Left lower extremity venous duplex completed.    Preliminary report:  See CV proc for preliminary results.  Gave Dr. Sherry Ruffing report  Mauro Kaufmann, Sheilia Reznick, RVT 02/24/2019, 5:23 PM

## 2019-02-24 NOTE — Discharge Instructions (Signed)
Your ultrasound today did not show evidence of DVT however we did find evidence of fluid overload with the elevated BNP.  As your kidney function was normal, please use the diuretic, Lasix, to help with the fluid overload.  Please use the compression stockings and elevate your legs.  Please go to the echocardiogram tomorrow and call your PCP for close follow-up in the next several days.  If you develop any chest pain or shortness of breath or any new symptoms, please return to the nearest emergency department immediately.

## 2019-02-24 NOTE — ED Notes (Signed)
Patient verbalizes understanding of discharge instructions. Opportunity for questioning and answers were provided. Armband removed by staff, pt discharged from ED.  

## 2019-02-24 NOTE — ED Provider Notes (Signed)
Care assumed from Dr. Sedonia Small.  At time of transfer of care, patient is awaiting results of DVT ultrasound as well as other labs.  BNP shows evidence of possible new heart failure with a BNP of over 1100.  There is no reported history of heart failure.  If ultrasound does not show DVT and patient is able to ambulate without hypoxia, patient may be can for discharge home given his normal kidney function to start a diuretic and follow-up with cardiology and PCP.  If DVT is discovered, will likely have a shared decision-making conversation discussing admission for further work-up and management.  6:33 PM No evidence of DVT on ultrasound.  Elevated BNP was discovered.  Patient was informed of this and however concern for fluid overload.  Patient did not want to pursue admission for further cardiac/CHF work-up as he reports he is getting an echocardiogram tomorrow morning and will call his PCP in the next few days.  He is interested in getting a dose of Lasix here and then taking a diuretic for the neck several days.  This was felt to be reasonable plan as patient is reliable.  Patient understands return precautions and follow-up instructions and was discharged in good condition.  Clinical Impression: 1. Leg edema   2. Pain of left lower extremity   3. Elevated brain natriuretic peptide (BNP) level     Disposition: Discharge  Condition: Good  I have discussed the results, Dx and Tx plan with the pt(& family if present). He/she/they expressed understanding and agree(s) with the plan. Discharge instructions discussed at great length. Strict return precautions discussed and pt &/or family have verbalized understanding of the instructions. No further questions at time of discharge.    New Prescriptions   FUROSEMIDE (LASIX) 20 MG TABLET    Take 1 tablet (20 mg total) by mouth daily.    Follow Up: Eunice Blase, MD [place] [address] [phone]     [place] [id number] [address] [place]       Barack Nicodemus, Gwenyth Allegra, MD 02/24/19 [phone]

## 2019-02-24 NOTE — Progress Notes (Signed)
Orthopedic Tech Progress Note Patient Details:  Darren Hamilton [facility] May 12, 1961 [id number]  Ortho Devices Type of Ortho Device: Crutches Ortho Device/Splint Interventions: Adjustment   Post Interventions Patient Tolerated: Well, Ambulated well Instructions Provided: Care of device   [name] 02/24/2019, 6:50 PM

## 2019-02-25 ENCOUNTER — Ambulatory Visit (HOSPITAL_COMMUNITY): Payer: BC Managed Care – PPO | Attending: Cardiology

## 2019-02-25 ENCOUNTER — Encounter: Payer: Self-pay | Admitting: Family Medicine

## 2019-02-25 DIAGNOSIS — R600 Localized edema: Secondary | ICD-10-CM

## 2019-02-25 DIAGNOSIS — R635 Abnormal weight gain: Secondary | ICD-10-CM

## 2019-02-25 DIAGNOSIS — R7989 Other specified abnormal findings of blood chemistry: Secondary | ICD-10-CM

## 2019-02-25 MED ORDER — PERFLUTREN LIPID MICROSPHERE
1.0000 mL | INTRAVENOUS | Status: AC | PRN
  Administered 2019-02-25: 2 mL via INTRAVENOUS

## 2019-02-27 ENCOUNTER — Encounter: Payer: Self-pay | Admitting: Family Medicine

## 2019-02-28 ENCOUNTER — Other Ambulatory Visit: Payer: Self-pay | Admitting: Orthopaedic Surgery

## 2019-02-28 ENCOUNTER — Telehealth: Payer: Self-pay

## 2019-02-28 MED ORDER — POTASSIUM CHLORIDE ER 10 MEQ PO TBCR: 10.0000 meq | EXTENDED_RELEASE_TABLET | Freq: Two times a day (BID) | ORAL | 0 refills | Status: DC

## 2019-02-28 MED ORDER — FUROSEMIDE 20 MG PO TABS: 40.0000 mg | ORAL_TABLET | Freq: Every day | ORAL | 0 refills | Status: DC

## 2019-02-28 NOTE — Procedures (Signed)
Patient Information     First Name: Darren Hamilton Last Name: Hamilton ID: [id number]  Birth Date: [birth year] Age: 58 Gender: Male  Referring Provider: Lavada Mesi, MD BMI: 41.1 (W=271 lb, H=5' 8'')  Neck Circ.:  20 '' Epworth:  19/24   Sleep Study Information    Study Date: Feb 20, 2019 S/H/A Version: [id number] / 4.1.1528 / 4  History:    58 year old man with a history of hypertension, hyperlipidemia, depression and obesity, who reports snoring, excessive daytime sleepiness and witnessed apneas per wife's report.   Summary & Diagnosis:    Severe OSA Recommendations:      This home sleep test demonstrates severe obstructive sleep apnea with a total AHI of 86.4/hour and O2 nadir of 67%. Treatment with positive airway pressure (in the form of CPAP) is recommended. This will require a full night CPAP titration study for proper treatment settings, O2 monitoring and mask fitting. Based on the severity of the sleep disordered breathing an attended titration study is indicated. However, patient's insurance has denied an attended sleep study; therefore, the patient will be advised to proceed with an autoPAP titration/trial at home for now. Please note that untreated obstructive sleep apnea may carry additional perioperative morbidity. Patients with significant obstructive sleep apnea should receive perioperative PAP therapy and the surgeons and particularly the anesthesiologist should be informed of the diagnosis and the severity of the sleep disordered breathing. The patient should be cautioned not to drive, work at heights, or operate dangerous or heavy equipment when tired or sleepy. Review and reiteration of good sleep hygiene measures should be pursued with any patient. Other causes of the patient's symptoms, including circadian rhythm disturbances, an underlying mood disorder, medication effect and/or an underlying medical problem cannot be ruled out based on this test. Clinical correlation is  recommended. The patient and his referring provider will be notified of the test results. The patient will be seen in follow up in sleep clinic at [facility].  I certify that I have reviewed the raw data recording prior to the issuance of this report in accordance with the standards of the American Academy of Sleep Medicine (AASM).  [name], MD, PhD [facility]) Diplomat, ABPN (Neurology and Sleep)            Sleep Summary  Oxygen Saturation Statistics   Start Study Time: End Study Time: Total Recording Time:     10:04:30 PM 6:42:01 AM      8 h, 37 min  Total Sleep Time % REM of Sleep Time:  7 h, 35 min  10.6    Mean: 89 Minimum: 67 Maximum: 99  Mean of Desaturations Nadirs (%):   83  Oxygen Desaturation. %: 4-9 10-20 >20 Total  Events Number Total  61 10.9  439 78.4  60 10.7  560 100.0  Oxygen Saturation:  <90 <=88 <85 <80 <70  Duration (minutes): Sleep % 217.6 193.7 47.8 42.5 96.3 21.1 23.2 5.1 0.7 0.2     Respiratory Indices      Total Events REM NREM All Night  pRDI:  627  pAHI:  627 ODI:  560  pAHIc: 147  % CSR: 0.0 65.0 65.0 65.0 17.5 89.0 89.0 78.7 40.3 86.4 86.4 77.1 35.9       Pulse Rate Statistics during Sleep (BPM)      Mean: 81 Minimum: 42 Maximum: 118    Indices are calculated using technically valid sleep time of  7 hrs, 15 min. Central-Indices are calculated  using technically valid sleep time of  4  hrs, 5 min. pRDI/pAHI are calculated using oxi desaturations ? 3%  Body Position Statistics  Position Supine Prone Right Left Non-Supine  Sleep (min) 19.0 12.5 24.0 140.0 176.5  Sleep % 4.2 2.7 5.3 30.7 38.7  pRDI 77.7 76.4 79.9 73.0 74.1  pAHI 77.7 76.4 79.9 73.0 74.1  ODI 77.7 91.7 97.9 69.4 74.9     Snoring Statistics Snoring Level (dB) >40 >50 >60 >70 >80 >Threshold (45)  Sleep (min) 103.9 34.5 7.7 0.0 0.0 61.4  Sleep % 22.8 7.6 1.7 0.0 0.0 13.5    Mean: 42 dB Sleep Stages Chart                                                                              pAHI=86.4                                                                                              Mild              Moderate                    Severe                                                 5              15                    30

## 2019-02-28 NOTE — Progress Notes (Signed)
Patient referred by Dr. Junius Roads, seen by me on 02/05/19, HST on 02/20/19.    Please call and notify the patient that the recent home sleep test showed obstructive sleep apnea in the severe range. While I recommend treatment for this in the form CPAP, his insurance will not approve a sleep study for this. They will likely only approve a trial of autoPAP, which means, that we don't have to bring him in for a sleep study with CPAP, but will let him try an autoPAP machine at home, through a DME company (of his choice, or as per insurance requirement). The DME representative will educate him on how to use the machine, how to put the mask on, etc. I have placed an order in the chart. Please send referral, talk to patient, send report to referring MD. We will need a FU in sleep clinic for 10 weeks post-PAP set up, please arrange that with me or one of our NPs. Thanks,   Star Age, MD, PhD [facility])

## 2019-02-28 NOTE — Progress Notes (Signed)
cpap

## 2019-02-28 NOTE — Addendum Note (Signed)
Addended by: Star Age on: 02/28/2019 08:03 AM   Modules accepted: Orders

## 2019-02-28 NOTE — Telephone Encounter (Signed)
I called pt to discuss his sleep study results. No answer, left a message asking him to call me back. 

## 2019-02-28 NOTE — Telephone Encounter (Signed)
-----   Message from Star Age, MD sent at 02/28/2019  8:03 AM EDT ----- Patient referred by Dr. Junius Roads, seen by me on 02/05/19, HST on 02/20/19.    Please call and notify the patient that the recent home sleep test showed obstructive sleep apnea in the severe range. While I recommend treatment for this in the form CPAP, his insurance will not approve a sleep study for this. They will likely only approve a trial of autoPAP, which means, that we don't have to bring him in for a sleep study with CPAP, but will let him try an autoPAP machine at home, through a DME company (of his choice, or as per insurance requirement). The DME representative will educate him on how to use the machine, how to put the mask on, etc. I have placed an order in the chart. Please send referral, talk to patient, send report to referring MD. We will need a FU in sleep clinic for 10 weeks post-PAP set up, please arrange that with me or one of our NPs. Thanks,   Star Age, MD, PhD [facility])

## 2019-03-04 NOTE — Telephone Encounter (Signed)
Pt returned my call. I advised pt that Dr. Rexene Alberts reviewed their sleep study results and found that pt has severe osa. Dr. Rexene Alberts recommends that pt start an auto pap at home. I reviewed PAP compliance expectations with the pt. Pt is agreeable to starting an auto-PAP. I advised pt that an order will be sent to a DME, [facility], and [facility] will call the pt within about one week after they file with the pt's insurance. [facility] will show the pt how to use the machine, fit for masks, and troubleshoot the auto-PAP if needed. A follow up appt was made for insurance purposes with Dr. Rexene Alberts on 05/21/2019 at 3:00pm. Pt verbalized understanding to arrive 15 minutes early and bring their auto-PAP. A letter with all of this information in it will be mailed to the pt as a reminder. I verified with the pt that the address we have on file is correct. Pt verbalized understanding of results. Pt had no questions at this time but was encouraged to call back if questions arise. I have sent the order to [facility] and have received confirmation that they have received the order.

## 2019-03-05 DIAGNOSIS — F39 Unspecified mood [affective] disorder: Secondary | ICD-10-CM | POA: Diagnosis not present

## 2019-03-06 MED ORDER — FUROSEMIDE 40 MG PO TABS: 20.0000 mg | ORAL_TABLET | Freq: Two times a day (BID) | ORAL | 3 refills | Status: DC | PRN

## 2019-03-06 MED ORDER — POTASSIUM CHLORIDE CRYS ER 10 MEQ PO TBCR: EXTENDED_RELEASE_TABLET | ORAL | 3 refills | Status: DC

## 2019-03-08 DIAGNOSIS — G4733 Obstructive sleep apnea (adult) (pediatric): Secondary | ICD-10-CM | POA: Diagnosis not present

## 2019-03-18 ENCOUNTER — Encounter: Payer: Self-pay | Admitting: Family Medicine

## 2019-03-18 DIAGNOSIS — R7989 Other specified abnormal findings of blood chemistry: Secondary | ICD-10-CM

## 2019-03-18 DIAGNOSIS — R600 Localized edema: Secondary | ICD-10-CM

## 2019-03-18 DIAGNOSIS — R635 Abnormal weight gain: Secondary | ICD-10-CM

## 2019-03-19 DIAGNOSIS — F39 Unspecified mood [affective] disorder: Secondary | ICD-10-CM | POA: Diagnosis not present

## 2019-03-19 MED ORDER — TORSEMIDE 10 MG PO TABS: 5.0000 mg | ORAL_TABLET | Freq: Two times a day (BID) | ORAL | 1 refills | Status: DC | PRN

## 2019-03-22 NOTE — Addendum Note (Signed)
Addended by: Hortencia Pilar on: 03/22/2019 04:18 PM   Modules accepted: Orders

## 2019-03-26 ENCOUNTER — Encounter: Payer: Self-pay | Admitting: *Deleted

## 2019-03-26 NOTE — Telephone Encounter (Signed)
Pt is schedule on Mon Oct 26 at 11am with Dr. Nechama Guard at [facility] location, pt aware via mychart

## 2019-03-27 ENCOUNTER — Encounter: Payer: Self-pay | Admitting: Family Medicine

## 2019-03-31 NOTE — Progress Notes (Signed)
Cardiology Office Note:    Date:  04/01/2019   ID:  Darren Hamilton, DOB [birth year], MRN [id number]  PCP:  Lavada Mesi, MD  Cardiologist:  No primary care provider on file.  Electrophysiologist:  None   Referring MD: Lavada Mesi, MD   Chief Complaint  Patient presents with  . Leg Swelling    History of Present Illness:    Darren Hamilton is a 58 y.o. male with a hx of hypertension, hyperlipidemia, recently diagnosed OSA who is referred by Dr. Prince Rome for evaluation of lower extremity edema.  Patient presented to the [facility] ED on 02/24/2019 with complaint of leg swelling.  Worsening swelling over the prior 1 to 2 days.  Also described pain in his lower extremities.  No redness, fever, trauma was noted.  He denied any chest pain or shortness of breath.  Chest x-ray showed no acute abnormalities.  Lower extremity duplex showed no evidence of DVT.  BNP was over 1100.  He was started on Lasix 20 mg daily and discharged from the ED.  He did not respond to Lasix and this was changed to torsemide 20 mg BID as an outpatient.  Normal albumin (3.8), LFTs, thyroid studies in July.  Reports weight increased from 245 lbs to 275 lbs in 3 weeks with severe LE edema.  Currently takes torsemide 20 mg BID, but increaed to three times daily as did not feel he was diuresing well.  TTE on 02/25/2019 showed normal LV systolic function, grade 1 diastolic dysfunction, mild LVH, normal RV size and function, no significant valvular disease, small/collapsible IVC.   Had normal stess test over 10 years ago which was unremarkable.  Father was diagnosed with CAD in early 12s, underwent CABG and AVR.    No past medical history on file.  No past surgical history on file.  Current Medications: Current Meds  Medication Sig  . acetaminophen (TYLENOL) 325 MG tablet Take 650 mg by mouth every 6 (six) hours as needed.  Marland Kitchen albuterol (VENTOLIN HFA) 108 (90 Base) MCG/ACT inhaler Inhale 2 puffs into the lungs every 6 (six)  hours as needed for wheezing or shortness of breath.  Marland Kitchen aspirin 81 MG tablet Take 81 mg by mouth daily.  . cetirizine (ZYRTEC) 10 MG tablet Take 10 mg by mouth daily.  . cholecalciferol (VITAMIN D) 25 MCG (1000 UT) tablet Take 1,000 Units by mouth daily.  . clobetasol ointment (TEMOVATE) 0.05 % PLEASE SEE ATTACHED FOR DETAILED DIRECTIONS  . escitalopram (LEXAPRO) 20 MG tablet Start 1/2 tab PO qd, may increase to 1 PO qd after 1 week  . fluticasone (FLONASE) 50 MCG/ACT nasal spray Place 2 sprays into both nostrils daily.  . furosemide (LASIX) 40 MG tablet Take 0.5-1 tablets (20-40 mg total) by mouth 2 (two) times daily as needed.  Marland Kitchen gemfibrozil (LOPID) 600 MG tablet Take 1 tablet (600 mg total) by mouth 2 (two) times daily before a meal.  . lisinopril (PRINIVIL,ZESTRIL) 10 MG tablet Take 1 tablet (10 mg total) by mouth daily.  . methocarbamol (ROBAXIN) 750 MG tablet Take 750 mg by mouth every 8 (eight) hours as needed for muscle spasms.  . Multiple Vitamin (MULTIVITAMIN) tablet Take 1 tablet by mouth daily.  . naproxen sodium (ALEVE) 220 MG tablet Take 220 mg by mouth.  . Olopatadine HCl (PATANASE) 0.6 % SOLN Place into the nose 2 (two) times daily.  . [DISCONTINUED] potassium chloride (K-DUR) 10 MEQ tablet Take 1 tablet (10 mEq total) by mouth 2 (two)  times daily.  . [DISCONTINUED] potassium chloride (KLOR-CON) 10 MEQ tablet 1 PO BID when taking lasix  . [DISCONTINUED] torsemide (DEMADEX) 10 MG tablet Take 0.5-1 tablets (5-10 mg total) by mouth 2 (two) times daily as needed.     Allergies:   Patient has no known allergies.   Social History   Socioeconomic History  . Marital status: Single    Spouse name: Not on file  . Number of children: Not on file  . Years of education: Not on file  . Highest education level: Not on file  Occupational History  . Not on file  Social Needs  . Financial resource strain: Not on file  . Food insecurity    Worry: Not on file    Inability: Not on file   . Transportation needs    Medical: Not on file    Non-medical: Not on file  Tobacco Use  . Smoking status: Never Smoker  . Smokeless tobacco: Never Used  Substance and Sexual Activity  . Alcohol use: Yes  . Drug use: No  . Sexual activity: Not on file  Lifestyle  . Physical activity    Days per week: Not on file    Minutes per session: Not on file  . Stress: Not on file  Relationships  . Social Herbalist on phone: Not on file    Gets together: Not on file    Attends religious service: Not on file    Active member of club or organization: Not on file    Attends meetings of clubs or organizations: Not on file    Relationship status: Not on file  Other Topics Concern  . Not on file  Social History Narrative  . Not on file     Family History: Father was diagnosed with CAD in early 63s, underwent CABG and AVR.    ROS:   Please see the history of present illness.     All other systems reviewed and are negative.  EKGs/Labs/Other Studies Reviewed:    The following studies were reviewed today:   EKG:  EKG is  ordered today.  The ekg ordered today demonstrates normal sinus rhythm, rate 99, PVCs, low voltage  TTE 02/25/19: 1. Left ventricular ejection fraction, by visual estimation, is 55 to 60%. The left ventricle has normal function. There is mildly increased left ventricular hypertrophy.  2. The mitral valve is normal in structure. No evidence of mitral valve regurgitation. No evidence of mitral stenosis.  3. The aortic valve is tricuspid Aortic valve regurgitation was not visualized by color flow Doppler. Mild aortic valve sclerosis without stenosis.  4. There is mild dilatation of the ascending aorta measuring 38 mm.  5. Global right ventricle has normal systolic function.The right ventricular size is normal. No increase in right ventricular wall thickness.  6. Left atrial size was normal.  7. Right atrial size was normal.  8. Left ventricular diastolic Doppler  parameters are consistent with impaired relaxation pattern of LV diastolic filling.  9. The inferior vena cava is normal in size with greater than 50% respiratory variability, suggesting right atrial pressure of 3 mmHg. 10. The tricuspid valve is normal in structure. Tricuspid valve regurgitation was not visualized by color flow Doppler.  Recent Labs: 12/25/2018: ALT 22; TSH 3.22 02/24/2019: B Natriuretic Peptide 1,161.0; BUN 14; Creatinine, Ser 1.17; Hemoglobin 16.2; Platelets 383; Potassium 3.9; Sodium 139  Recent Lipid Panel    Component Value Date/Time   CHOL 265 (H) 12/25/2018 1700  TRIG 419 (H) 12/25/2018 1700   HDL 52 12/25/2018 1700   CHOLHDL 5.1 (H) 12/25/2018 1700   LDLCALC  12/25/2018 1700     Comment:     . LDL cholesterol not calculated. Triglyceride levels greater than 400 mg/dL invalidate calculated LDL results. . Reference range: <100 . Desirable range <100 mg/dL for primary prevention;   <70 mg/dL for patients with CHD or diabetic patients  with > or = 2 CHD risk factors. Marland Kitchen. LDL-C is now calculated using the Martin-Hopkins  calculation, which is a validated novel method providing  better accuracy than the Friedewald equation in the  estimation of LDL-C.  Horald PollenMartin SS et al. Lenox AhrJAMA. 6387;564(332013;310(19): 2061-2068  (http://education.[facility].com/faq/FAQ164)     Physical Exam:    VS:  BP 133/90   Pulse (!) 103   Temp (!) 97.3 F (36.3 C)   Ht 5\' 8"  (1.727 m)   Wt 280 lb 12.8 oz (127.4 kg)   SpO2 96%   BMI 42.70 kg/m     Wt Readings from Last 3 Encounters:  04/01/19 280 lb 12.8 oz (127.4 kg)  02/05/19 270 lb (122.5 kg)  06/26/18 253 lb 8 oz (115 kg)     GEN:   in no acute distress HEENT: Normal NECK: No JVD appreciated, difficult to assess given body habitus LYMPHATICS: No lymphadenopathy CARDIAC: RRR, no murmurs, rubs, gallops RESPIRATORY:  Clear to auscultation without rales, wheezing or rhonchi  ABDOMEN: Soft, non-tender, non-distended  MUSCULOSKELETAL:  2+ BLE to thighs SKIN: Warm and dry NEUROLOGIC:  Alert and oriented x 3 PSYCHIATRIC:  Normal affect   ASSESSMENT:    1. Lower extremity edema   2. SOB (shortness of breath)   3. Essential hypertension   4. Hyperlipidemia, unspecified hyperlipidemia type    PLAN:    In order of problems listed above:  LE Edema/dyspnea: acute, had sudden onset in September.  Suspect secondary to diastolic heart failure, given significant BNP elevation and diastolic dysfunction seen on TTE.  No evidence of DVT on LE duplex.  Acute nephrotic syndrome also on differential.   Previously had normal albumin and in July, will recheck albumin and urinalysis.  Reports poor diuresis on torsemide 20 mg BID. Will increase to 40 mg BID and check chemistry to monitor electrolytes.  Was taking potassium 10 meq BID, will increase to 20 meq BID with increased torsemide dose  Hypertension: On lisinopril 10 mg daily.  Will continue  Hyperlipidemia: Gemfibrozil 600 mg twice daily  OSA: recently started APAP, encouraged compliance.  RTC in 1 month  Medication Adjustments/Labs and Tests Ordered: Current medicines are reviewed at length with the patient today.  Concerns regarding medicines are outlined above.  Orders Placed This Encounter  Procedures  . B Nat Peptide  . Comprehensive Metabolic Panel (CMET)  . TSH  . Urinalysis  . EKG 12-Lead   Meds ordered this encounter  Medications  . torsemide (DEMADEX) 20 MG tablet    Sig: Take 2 tablets ( 40 mg ) twice a day    Dispense:  180 tablet    Refill:  3  . potassium chloride SA (KLOR-CON) 20 MEQ tablet    Sig: Take 20 meq twice a day    Dispense:  180 tablet    Refill:  3    Patient Instructions  Medication Instructions:  Increase Torsemide to 40 mg twice a day Increase Potassium 20 meq twice a day   Lab Work: Cmet,Bnp,Tsh,urinalysis today    Testing/Procedures: None ordered  Follow-Up: At  [facility], you and your health  needs are our priority.  As part of our continuing mission to provide you with exceptional heart care, we have created designated Provider Care Teams.  These Care Teams include your primary Cardiologist (physician) and Advanced Practice Providers (APPs -  Physician Assistants and Nurse Practitioners) who all work together to provide you with the care you need, when you need it.  Your next appointment:  1 month  Mon 05/06/19 at 9:40 am        Signed, Little Ishikawa, MD  04/01/2019 2:28 PM    [facility]

## 2019-04-01 ENCOUNTER — Other Ambulatory Visit: Payer: Self-pay

## 2019-04-01 ENCOUNTER — Encounter: Payer: Self-pay | Admitting: Cardiology

## 2019-04-01 ENCOUNTER — Ambulatory Visit: Payer: BC Managed Care – PPO | Admitting: Cardiology

## 2019-04-01 VITALS — BP 133/90 | HR 103 | Temp 97.3°F | Ht 68.0 in | Wt 280.8 lb

## 2019-04-01 DIAGNOSIS — I10 Essential (primary) hypertension: Secondary | ICD-10-CM

## 2019-04-01 DIAGNOSIS — E785 Hyperlipidemia, unspecified: Secondary | ICD-10-CM

## 2019-04-01 DIAGNOSIS — R0602 Shortness of breath: Secondary | ICD-10-CM | POA: Diagnosis not present

## 2019-04-01 DIAGNOSIS — R600 Localized edema: Secondary | ICD-10-CM

## 2019-04-01 DIAGNOSIS — N009 Acute nephritic syndrome with unspecified morphologic changes: Secondary | ICD-10-CM

## 2019-04-01 MED ORDER — POTASSIUM CHLORIDE CRYS ER 20 MEQ PO TBCR: EXTENDED_RELEASE_TABLET | ORAL | 3 refills | Status: DC

## 2019-04-01 MED ORDER — TORSEMIDE 20 MG PO TABS: ORAL_TABLET | ORAL | 3 refills | Status: DC

## 2019-04-01 NOTE — Patient Instructions (Addendum)
Medication Instructions:  Increase Torsemide to 40 mg twice a day Increase Potassium 20 meq twice a day   Lab Work: Cmet,Bnp,Tsh,urinalysis today    Testing/Procedures: None ordered  Follow-Up: At [facility], you and your health needs are our priority.  As part of our continuing mission to provide you with exceptional heart care, we have created designated Provider Care Teams.  These Care Teams include your primary Cardiologist (physician) and Advanced Practice Providers (APPs -  Physician Assistants and Nurse Practitioners) who all work together to provide you with the care you need, when you need it.  Your next appointment:  1 month  Mon 05/06/19 at 9:40 am

## 2019-04-02 ENCOUNTER — Telehealth: Payer: Self-pay | Admitting: Cardiology

## 2019-04-02 DIAGNOSIS — R600 Localized edema: Secondary | ICD-10-CM

## 2019-04-02 DIAGNOSIS — E8809 Other disorders of plasma-protein metabolism, not elsewhere classified: Secondary | ICD-10-CM

## 2019-04-02 DIAGNOSIS — F39 Unspecified mood [affective] disorder: Secondary | ICD-10-CM | POA: Diagnosis not present

## 2019-04-02 LAB — TSH: TSH: 8.62 u[IU]/mL — ABNORMAL HIGH (ref 0.450–4.500)

## 2019-04-02 LAB — COMPREHENSIVE METABOLIC PANEL
ALT: 14 IU/L (ref 0–44)
AST: 23 IU/L (ref 0–40)
Albumin/Globulin Ratio: 0.9 — ABNORMAL LOW (ref 1.2–2.2)
Albumin: 2 g/dL — ABNORMAL LOW (ref 3.8–4.9)
Alkaline Phosphatase: 80 IU/L (ref 39–117)
BUN/Creatinine Ratio: 11 (ref 9–20)
BUN: 13 mg/dL (ref 6–24)
Bilirubin Total: <0.2 mg/dL (ref 0.0–1.2)
CO2: 24 mmol/L (ref 20–29)
Calcium: 8 mg/dL — ABNORMAL LOW (ref 8.7–10.2)
Chloride: 101 mmol/L (ref 96–106)
Creatinine, Ser: 1.14 mg/dL (ref 0.76–1.27)
GFR calc Af Amer: 83 mL/min/{1.73_m2} (ref >59)
GFR calc non Af Amer: 71 mL/min/{1.73_m2} (ref >59)
Globulin, Total: 2.3 g/dL (ref 1.5–4.5)
Glucose: 82 mg/dL (ref 65–99)
Potassium: 3.9 mmol/L (ref 3.5–5.2)
Sodium: 138 mmol/L (ref 134–144)
Total Protein: 4.3 g/dL — CL (ref 6.0–8.5)

## 2019-04-02 LAB — BRAIN NATRIURETIC PEPTIDE: BNP: 9 pg/mL (ref 0.0–100.0)

## 2019-04-02 LAB — URINALYSIS
Bilirubin, UA: NEGATIVE
Glucose, UA: NEGATIVE
Leukocytes,UA: NEGATIVE
Nitrite, UA: NEGATIVE
Specific Gravity, UA: >=1.03 — AB (ref 1.005–1.030)
Urobilinogen, Ur: 1 mg/dL (ref 0.2–1.0)
pH, UA: 6 (ref 5.0–7.5)

## 2019-04-02 NOTE — Telephone Encounter (Signed)
Spoke with patient.  Labs indicate albumin 2.0 and 4+ proteinuria on UA.  Suspect nephrotic syndrome as cause of his edema.  Will check urine protein/Cr ratio and refer to nephrology for further evaluation.

## 2019-04-02 NOTE — Addendum Note (Signed)
Addended by: Kathyrn Lass on: 04/02/2019 08:26 AM   Modules accepted: Orders

## 2019-04-05 ENCOUNTER — Telehealth: Payer: Self-pay | Admitting: Cardiology

## 2019-04-05 DIAGNOSIS — Z79899 Other long term (current) drug therapy: Secondary | ICD-10-CM

## 2019-04-05 DIAGNOSIS — N009 Acute nephritic syndrome with unspecified morphologic changes: Secondary | ICD-10-CM

## 2019-04-05 NOTE — Telephone Encounter (Signed)
    Please return call to patient with lab results 

## 2019-04-05 NOTE — Telephone Encounter (Signed)
Reviewed CMP, BNP, Urinalysis, and TSH lab results. Contacted Elly Modena, LPN working with Dr. Gardiner Rhyme. Inquired about abnormal TSH and nephrotic syndrome. She spoke with Dr. Gardiner Rhyme who stated that elevated TSH likely d/t nephrotic syndrome. He requested repeat TSH along with Free T4 to be ordered and performed next week. He also requested that triage nurse f/u on whether the pt has been in contact with nephrology regarding referral.  Notes recorded by Donato Heinz, MD on 04/04/2019 at 10:50 AM EDT  His labs are consistent with nephrotic syndrome, and he has been referred to nephrology for evaluation. His low BNP suggests he has diuresed well on torsemide. Would recheck chemistry and magnesium in 1 week since we increased his torsemide dose.  Spoke with pt and informed of the above result note. Pt states he is able to see results on mychart. Informed pt that in addition to chem lab and magnesium lab, Dr. Gardiner Rhyme would like repeat TSH and a  Free T4 ordered. All labs to be repeated 1 week from the last. Pt verbalized understanding. Questioned if pt has been contacted by nephrology for appt. Pt states he has not yet been contacted. Confirmed order for referral in epic

## 2019-04-08 ENCOUNTER — Telehealth: Payer: Self-pay | Admitting: Cardiology

## 2019-04-08 DIAGNOSIS — G4733 Obstructive sleep apnea (adult) (pediatric): Secondary | ICD-10-CM | POA: Diagnosis not present

## 2019-04-08 NOTE — Telephone Encounter (Signed)
Called patient- advised that referral information was sent on 10/27- as a ASAP referral, so they should hopefully call him this week. Also- patient states that he continues to have edema all over- he was increased to 40 mg of lasix- but states it has not had any change. Patient denies weight gain, SOB or chest pain at this time. But is questioning if he should increase to the 60 mg or atleast try for a few days to see if the edema goes down. I advised I would route message to MD and give a call back, patient verbalized understanding.

## 2019-04-08 NOTE — Telephone Encounter (Signed)
Patient called stating he was to have a NEPHROLOGY consult, no one from that office has called him yet.  Patient also has questions about his medication increase:  furosemide (LASIX) 40 MG tablet potassium chloride SA (KLOR-CON) 20 MEQ tablet Patient wants to know if he should increase the furosemide from 40mg  to 60mg  and the potassium chloride from 20MEQ to 30 MEQ, as his edema has still not gone down.

## 2019-04-08 NOTE — Telephone Encounter (Signed)
Spoke with patient.  His BNP has improved from 1100 to 9, and that was before increasing his torsemide dose.  He denies any dyspnea but does report continues to have significant lower extremity edema.  He is coming in tomorrow to recheck labs.  He has an appointment with nephrology on Wednesday.  We will continue current dose of torsemide until follows up with nephrology

## 2019-04-09 DIAGNOSIS — E8809 Other disorders of plasma-protein metabolism, not elsewhere classified: Secondary | ICD-10-CM | POA: Diagnosis not present

## 2019-04-09 DIAGNOSIS — Z79899 Other long term (current) drug therapy: Secondary | ICD-10-CM | POA: Diagnosis not present

## 2019-04-09 LAB — BASIC METABOLIC PANEL
BUN/Creatinine Ratio: 16 (ref 9–20)
BUN: 20 mg/dL (ref 6–24)
CO2: 25 mmol/L (ref 20–29)
Calcium: 7.8 mg/dL — ABNORMAL LOW (ref 8.7–10.2)
Chloride: 102 mmol/L (ref 96–106)
Creatinine, Ser: 1.25 mg/dL (ref 0.76–1.27)
GFR calc Af Amer: 74 mL/min/{1.73_m2} (ref >59)
GFR calc non Af Amer: 64 mL/min/{1.73_m2} (ref >59)
Glucose: 116 mg/dL — ABNORMAL HIGH (ref 65–99)
Potassium: 4.4 mmol/L (ref 3.5–5.2)
Sodium: 138 mmol/L (ref 134–144)

## 2019-04-09 LAB — MAGNESIUM: Magnesium: 2.1 mg/dL (ref 1.6–2.3)

## 2019-04-10 ENCOUNTER — Other Ambulatory Visit: Payer: Self-pay | Admitting: Family Medicine

## 2019-04-10 DIAGNOSIS — N009 Acute nephritic syndrome with unspecified morphologic changes: Secondary | ICD-10-CM | POA: Diagnosis not present

## 2019-04-10 DIAGNOSIS — N182 Chronic kidney disease, stage 2 (mild): Secondary | ICD-10-CM | POA: Diagnosis not present

## 2019-04-10 DIAGNOSIS — D631 Anemia in chronic kidney disease: Secondary | ICD-10-CM | POA: Diagnosis not present

## 2019-04-10 DIAGNOSIS — N189 Chronic kidney disease, unspecified: Secondary | ICD-10-CM | POA: Diagnosis not present

## 2019-04-10 DIAGNOSIS — E785 Hyperlipidemia, unspecified: Secondary | ICD-10-CM | POA: Diagnosis not present

## 2019-04-10 LAB — PROTEIN / CREATININE RATIO, URINE
Creatinine, Urine: 88.2 mg/dL
Protein, Ur: 548.2 mg/dL
Protein/Creat Ratio: 6215 mg/g creat — ABNORMAL HIGH (ref 0–200)

## 2019-04-15 ENCOUNTER — Other Ambulatory Visit: Payer: Self-pay | Admitting: Nephrology

## 2019-04-15 DIAGNOSIS — N182 Chronic kidney disease, stage 2 (mild): Secondary | ICD-10-CM

## 2019-04-16 DIAGNOSIS — F39 Unspecified mood [affective] disorder: Secondary | ICD-10-CM | POA: Diagnosis not present

## 2019-04-17 ENCOUNTER — Other Ambulatory Visit (HOSPITAL_COMMUNITY): Payer: Self-pay | Admitting: Nephrology

## 2019-04-17 DIAGNOSIS — N182 Chronic kidney disease, stage 2 (mild): Secondary | ICD-10-CM

## 2019-04-23 ENCOUNTER — Ambulatory Visit
Admission: RE | Admit: 2019-04-23 | Discharge: 2019-04-23 | Disposition: A | Discharge status: Home / Self Care | Payer: BC Managed Care – PPO | Source: Ambulatory Visit | Attending: Nephrology | Admitting: Nephrology

## 2019-04-23 DIAGNOSIS — N189 Chronic kidney disease, unspecified: Secondary | ICD-10-CM | POA: Diagnosis not present

## 2019-04-23 DIAGNOSIS — N182 Chronic kidney disease, stage 2 (mild): Secondary | ICD-10-CM

## 2019-04-24 ENCOUNTER — Other Ambulatory Visit: Payer: Self-pay | Admitting: Radiology

## 2019-04-25 ENCOUNTER — Ambulatory Visit (HOSPITAL_COMMUNITY)
Admission: RE | Admit: 2019-04-25 | Discharge: 2019-04-25 | Disposition: A | Discharge status: Home / Self Care | Payer: BC Managed Care – PPO | Source: Ambulatory Visit | Attending: Nephrology | Admitting: Nephrology

## 2019-04-25 ENCOUNTER — Encounter (HOSPITAL_COMMUNITY): Payer: Self-pay

## 2019-04-25 ENCOUNTER — Other Ambulatory Visit: Payer: Self-pay

## 2019-04-25 DIAGNOSIS — I10 Essential (primary) hypertension: Secondary | ICD-10-CM | POA: Insufficient documentation

## 2019-04-25 DIAGNOSIS — Z7982 Long term (current) use of aspirin: Secondary | ICD-10-CM | POA: Diagnosis not present

## 2019-04-25 DIAGNOSIS — N182 Chronic kidney disease, stage 2 (mild): Secondary | ICD-10-CM | POA: Diagnosis not present

## 2019-04-25 DIAGNOSIS — Z8249 Family history of ischemic heart disease and other diseases of the circulatory system: Secondary | ICD-10-CM | POA: Diagnosis not present

## 2019-04-25 DIAGNOSIS — Z79899 Other long term (current) drug therapy: Secondary | ICD-10-CM | POA: Insufficient documentation

## 2019-04-25 DIAGNOSIS — F419 Anxiety disorder, unspecified: Secondary | ICD-10-CM | POA: Diagnosis not present

## 2019-04-25 DIAGNOSIS — F329 Major depressive disorder, single episode, unspecified: Secondary | ICD-10-CM | POA: Insufficient documentation

## 2019-04-25 DIAGNOSIS — R808 Other proteinuria: Secondary | ICD-10-CM | POA: Insufficient documentation

## 2019-04-25 DIAGNOSIS — J45909 Unspecified asthma, uncomplicated: Secondary | ICD-10-CM | POA: Diagnosis not present

## 2019-04-25 DIAGNOSIS — N049 Nephrotic syndrome with unspecified morphologic changes: Secondary | ICD-10-CM | POA: Diagnosis not present

## 2019-04-25 HISTORY — DX: Nephrotic syndrome with unspecified morphologic changes: N04.9

## 2019-04-25 HISTORY — DX: Unspecified asthma, uncomplicated: J45.909

## 2019-04-25 HISTORY — DX: Depression, unspecified: F32.A

## 2019-04-25 HISTORY — DX: Essential (primary) hypertension: I10

## 2019-04-25 HISTORY — DX: Anxiety disorder, unspecified: F41.9

## 2019-04-25 LAB — PROTIME-INR
INR: 1 (ref 0.8–1.2)
Prothrombin Time: 12.9 seconds (ref 11.4–15.2)

## 2019-04-25 LAB — CBC
HCT: 41.3 % (ref 39.0–52.0)
Hemoglobin: 13.7 g/dL (ref 13.0–17.0)
MCH: 29.6 pg (ref 26.0–34.0)
MCHC: 33.2 g/dL (ref 30.0–36.0)
MCV: 89.2 fL (ref 80.0–100.0)
Platelets: 369 10*3/uL (ref 150–400)
RBC: 4.63 MIL/uL (ref 4.22–5.81)
RDW: 13.2 % (ref 11.5–15.5)
WBC: 5.9 10*3/uL (ref 4.0–10.5)
nRBC: 0 % (ref 0.0–0.2)

## 2019-04-25 MED ORDER — LIDOCAINE HCL (PF) 1 % IJ SOLN
INTRAMUSCULAR | Status: AC
  Filled 2019-04-25: qty 30

## 2019-04-25 MED ORDER — MIDAZOLAM HCL 2 MG/2ML IJ SOLN
INTRAMUSCULAR | Status: AC | PRN
  Administered 2019-04-25: 1 mg via INTRAVENOUS

## 2019-04-25 MED ORDER — FENTANYL CITRATE (PF) 100 MCG/2ML IJ SOLN
INTRAMUSCULAR | Status: AC | PRN
  Administered 2019-04-25: 50 ug via INTRAVENOUS

## 2019-04-25 MED ORDER — SODIUM CHLORIDE 0.9 % IV SOLN: INTRAVENOUS | Status: DC

## 2019-04-25 MED ORDER — GELATIN ABSORBABLE 12-7 MM EX MISC
CUTANEOUS | Status: AC
  Filled 2019-04-25: qty 1

## 2019-04-25 MED ORDER — FENTANYL CITRATE (PF) 100 MCG/2ML IJ SOLN
INTRAMUSCULAR | Status: AC
  Filled 2019-04-25: qty 2

## 2019-04-25 MED ORDER — MIDAZOLAM HCL 2 MG/2ML IJ SOLN
INTRAMUSCULAR | Status: AC
  Filled 2019-04-25: qty 2

## 2019-04-25 NOTE — Procedures (Signed)
Interventional Radiology Procedure:   Indications: CKD (chronic kidney disease) stage 2, GFR 60-89 ml/min   Procedure: US guided right renal biopsy  Findings: 2 cores from right kidney lower pole  Complications: None     EBL: Less than 10 ml  Plan: Bedrest 4 hours.    Marlaine Arey R. Anselm Pancoast, MD  Pager: [phone]

## 2019-04-25 NOTE — H&P (Addendum)
Chief Complaint: Patient was seen in consultation today for random renal biopsy at the request of [place]  Referring Physician(s): Patel,Jay  Supervising Physician: Corrie Mckusick  Patient Status: [facility] - Out-pt  History of Present Illness: Darren Hamilton is a 58 y.o. male   CKD Nephrotic syndrome Nephrotic range proteinuria x 4 mo B LE edema  Request for random renal bx per Dr Posey Pronto  Past Medical History:  Diagnosis Date   Anxiety    Asthma    Depression    Hypertension    Nephrotic syndrome     History reviewed. No pertinent surgical history.  Allergies: Patient has no known allergies.  Medications: Prior to Admission medications   Medication Sig Start Date End Date Taking? Authorizing Provider  acetaminophen (TYLENOL) 325 MG tablet Take 650 mg by mouth every 6 (six) hours as needed for moderate pain or headache.    Yes [provider]  aspirin 81 MG tablet Take 81 mg by mouth daily.   Yes [provider]  cetirizine (ZYRTEC) 10 MG tablet Take 10 mg by mouth daily.   Yes [provider]  Cholecalciferol (VITAMIN D3) 50 MCG (2000 UT) capsule Take 2,000 Units by mouth daily.    Yes [provider]  escitalopram (LEXAPRO) 20 MG tablet Start 1/2 tab PO qd, may increase to 1 PO qd after 1 week Patient taking differently: Take 20 mg by mouth daily.  12/25/18  Yes Hilts, Legrand Como, MD  fluticasone (FLONASE) 50 MCG/ACT nasal spray Place 2 sprays into both nostrils daily.   Yes [provider]  gemfibrozil (LOPID) 600 MG tablet Take 1 tablet (600 mg total) by mouth 2 (two) times daily before a meal. 06/26/18  Yes Hilts, Legrand Como, MD  lisinopril (PRINIVIL,ZESTRIL) 10 MG tablet Take 1 tablet (10 mg total) by mouth daily. 06/26/18  Yes Hilts, Legrand Como, MD  Multiple Vitamin (MULTIVITAMIN) tablet Take 1 tablet by mouth daily.   Yes [provider]  potassium chloride SA (KLOR-CON) 20 MEQ tablet Take 20 meq twice a  day Patient taking differently: Take 30 mEq by mouth daily.  04/01/19  Yes Donato Heinz, MD  torsemide (DEMADEX) 20 MG tablet Take 2 tablets ( 40 mg ) twice a day Patient taking differently: Take 60 mg by mouth daily.  04/01/19  Yes Donato Heinz, MD  albuterol (VENTOLIN HFA) 108 (90 Base) MCG/ACT inhaler Inhale 2 puffs into the lungs every 6 (six) hours as needed for wheezing or shortness of breath. 12/25/18   Hilts, Legrand Como, MD  methocarbamol (ROBAXIN) 750 MG tablet Take 750 mg by mouth every 8 (eight) hours as needed for muscle spasms.    [provider]     Family History  Problem Relation Age of Onset   Heart failure Mother    Heart failure Father     Social History   Socioeconomic History   Marital status: Single    Spouse name: Not on file   Number of children: Not on file   Years of education: Not on file   Highest education level: Not on file  Occupational History   Not on file  Social Needs   Financial resource strain: Not on file   Food insecurity    Worry: Not on file    Inability: Not on file   Transportation needs    Medical: Not on file    Non-medical: Not on file  Tobacco Use   Smoking status: Never Smoker   Smokeless tobacco: Never  Used  Substance and Sexual Activity   Alcohol use: Not Currently   Drug use: No   Sexual activity: Not on file  Lifestyle   Physical activity    Days per week: Not on file    Minutes per session: Not on file   Stress: Not on file  Relationships   Social connections    Talks on phone: Not on file    Gets together: Not on file    Attends religious service: Not on file    Active member of club or organization: Not on file    Attends meetings of clubs or organizations: Not on file    Relationship status: Not on file  Other Topics Concern   Not on file  Social History Narrative   Not on file     Review of Systems: A 12 point ROS discussed and pertinent positives are  indicated in the HPI above.  All other systems are negative.  Review of Systems  Constitutional: Negative for activity change, fatigue and fever.  Respiratory: Positive for shortness of breath. Negative for cough.   Cardiovascular: Negative for chest pain.  Gastrointestinal: Negative for abdominal pain.  Neurological: Negative for weakness.  Psychiatric/Behavioral: Negative for behavioral problems and confusion.    Vital Signs: BP (!) 132/97    Pulse 91    Temp 97.8 F (36.6 C) (Oral)    Resp 18    Ht 5\' 8"  (1.727 m)    Wt 281 lb (127.5 kg)    SpO2 99%    BMI 42.73 kg/m   Physical Exam Vitals signs reviewed.  Cardiovascular:     Rate and Rhythm: Normal rate and regular rhythm.     Heart sounds: Normal heart sounds.  Pulmonary:     Effort: Pulmonary effort is normal.     Breath sounds: Normal breath sounds.  Abdominal:     Tenderness: There is no abdominal tenderness.  Musculoskeletal: Normal range of motion.        General: Swelling present.     Right lower leg: Edema present.     Left lower leg: Edema present.  Neurological:     Mental Status: He is alert and oriented to person, place, and time.  Psychiatric:        Mood and Affect: Mood normal.        Behavior: Behavior normal.        Thought Content: Thought content normal.        Judgment: Judgment normal.     Imaging: [place] [facility]  Result Date: 04/23/2019 CLINICAL DATA:  Chronic kidney disease EXAM: RENAL / URINARY TRACT ULTRASOUND COMPLETE COMPARISON:  None. FINDINGS: Right Kidney: Renal measurements: 12.5 x 6.1 x 6.2 cm = volume: 247 mL . Echogenicity within normal limits. No mass or hydronephrosis visualized. Left Kidney: Renal measurements: 13.5 x 6.5 x 5.5 cm = volume: 253 mL. Echogenicity within normal limits. No mass or hydronephrosis visualized. Bladder: Appears normal for degree of bladder distention. Slightly enlarged prostate with volume of 36 mL. Other: None. IMPRESSION: Negative renal ultrasound  Electronically Signed   By: Jasmine PangKim  Fujinaga M.D.   On: 04/23/2019 23:16    Labs:  CBC: Recent Labs    06/26/18 0925 12/25/18 1700 02/24/19 1158  WBC 7.6 7.1 9.9  HGB 14.7 14.8 16.2  HCT 42.4 43.8 47.0  PLT 347 322 383    COAGS: No results for input(s): INR, APTT in the last 8760 hours.  BMP: Recent Labs    12/25/18 1700  02/24/19 1158 04/01/19 1246 04/09/19 0822  NA 139 139 138 138  K 4.1 3.9 3.9 4.4  CL 103 108 101 102  CO2 24 22 24 25   GLUCOSE 97 162* 82 116*  BUN 17 14 13 20   CALCIUM 9.5 8.8* 8.0* 7.8*  CREATININE 1.05 1.17 1.14 1.25  GFRNONAA  --  >60 71 64  GFRAA  --  >60 83 74    LIVER FUNCTION TESTS: Recent Labs    06/26/18 0925 12/25/18 1700 04/01/19 1246  BILITOT 0.4 0.4 <0.2  AST 23 18 23   ALT 28 22 14   ALKPHOS  --   --  80  PROT 7.0 6.5 4.3*  ALBUMIN  --   --  2.0*    TUMOR MARKERS: No results for input(s): AFPTM, CEA, CA199, CHROMGRNA in the last 8760 hours.  Assessment and Plan:  CKD Nephrotic range proteinuria Scheduled for random renal bx Risks and benefits of random renal biopsy was discussed with the patient and/or patient's family including, but not limited to bleeding, infection, damage to adjacent structures or low yield requiring additional tests.  All of the questions were answered and there is agreement to proceed.  Consent signed and in chart.   Thank you for this interesting consult.  I greatly enjoyed meeting Jonny Dearden and look forward to participating in their care.  A copy of this report was sent to the requesting provider on this date.  Electronically Signed: 04/03/19, PA-C 04/25/2019, 6:51 AM   I spent a total of  30 Minutes   in face to face in clinical consultation, greater than 50% of which was counseling/coordinating care for random renal biopsy

## 2019-04-25 NOTE — Discharge Instructions (Addendum)
Percutaneous Kidney Biopsy, Care After °This sheet gives you information about how to care for yourself after your procedure. Your health care provider may also give you more specific instructions. If you have problems or questions, contact your health care provider. °What can I expect after the procedure? °After the procedure, it is common to have: °· Pain or soreness near the area where the needle went through your skin (biopsy site). °· Bright pink or cloudy urine for 24 hours after the procedure. °Follow these instructions at home: °Activity °· Return to your normal activities as told by your health care provider. Ask your health care provider what activities are safe for you. °· Do not drive for 24 hours if you were given a medicine to help you relax (sedative). °· Do not lift anything that is heavier than 10 lb (4.5 kg) until your health care provider tells you that it is safe. °· Avoid activities that take a lot of effort (are strenuous) until your health care provider approves. Most people will have to wait 2 weeks before returning to activities such as exercise or sexual intercourse. °General instructions ° °· Take over-the-counter and prescription medicines only as told by your health care provider. °· You may eat and drink after your procedure. Follow instructions from your health care provider about eating or drinking restrictions. °· Check your biopsy site every day for signs of infection. Check for: °? More redness, swelling, or pain. °? More fluid or blood. °? Warmth. °? Pus or a bad smell. °· Keep all follow-up visits as told by your health care provider. This is important. °Contact a health care provider if: °· You have more redness, swelling, or pain around your biopsy site. °· You have more fluid or blood coming from your biopsy site. °· Your biopsy site feels warm to the touch. °· You have pus or a bad smell coming from your biopsy site. °· You have blood in your urine more than 24 hours after  your procedure. °Get help right away if: °· You have dark red or brown urine. °· You have a fever. °· You are unable to urinate. °· You feel burning when you urinate. °· You feel faint. °· You have severe pain in your abdomen or side. °This information is not intended to replace advice given to you by your health care provider. Make sure you discuss any questions you have with your health care provider. °Document Released: 01/23/2013 Document Revised: 05/05/2017 Document Reviewed: 03/04/2016 °Elsevier Patient Education © 2020 Elsevier Inc[employer] °Moderate Conscious Sedation, Adult, Care After °These instructions provide you with information about caring for yourself after your procedure. Your health care provider may also give you more specific instructions. Your treatment has been planned according to current medical practices, but problems sometimes occur. Call your health care provider if you have any problems or questions after your procedure. °What can I expect after the procedure? °After your procedure, it is common: °· To feel sleepy for several hours. °· To feel clumsy and have poor balance for several hours. °· To have poor judgment for several hours. °· To vomit if you eat too soon. °Follow these instructions at home: °For at least 24 hours after the procedure: ° °· Do not: °? Participate in activities where you could fall or become injured. °? Drive. °? Use heavy machinery. °? Drink alcohol. °? Take sleeping pills or medicines that cause drowsiness. °? Make important decisions or sign legal documents. °? Take care of children on your   own. °· Rest. °Eating and drinking °· Follow the diet recommended by your health care provider. °· If you vomit: °? Drink water, juice, or soup when you can drink without vomiting. °? Make sure you have little or no nausea before eating solid foods. °General instructions °· Have a responsible adult stay with you until you are awake and alert. °· Take over-the-counter and  prescription medicines only as told by your health care provider. °· If you smoke, do not smoke without supervision. °· Keep all follow-up visits as told by your health care provider. This is important. °Contact a health care provider if: °· You keep feeling nauseous or you keep vomiting. °· You feel light-headed. °· You develop a rash. °· You have a fever. °Get help right away if: °· You have trouble breathing. °This information is not intended to replace advice given to you by your health care provider. Make sure you discuss any questions you have with your health care provider. °Document Released: 03/13/2013 Document Revised: 05/05/2017 Document Reviewed: 09/12/2015 °Elsevier Patient Education © 2020 Elsevier Inc. ° °

## 2019-04-30 DIAGNOSIS — F39 Unspecified mood [affective] disorder: Secondary | ICD-10-CM | POA: Diagnosis not present

## 2019-05-01 ENCOUNTER — Ambulatory Visit: Payer: BC Managed Care – PPO | Admitting: Interventional Cardiology

## 2019-05-06 ENCOUNTER — Ambulatory Visit: Payer: BC Managed Care – PPO | Admitting: Cardiology

## 2019-05-06 LAB — SURGICAL PATHOLOGY

## 2019-05-08 DIAGNOSIS — G4733 Obstructive sleep apnea (adult) (pediatric): Secondary | ICD-10-CM | POA: Diagnosis not present

## 2019-05-13 ENCOUNTER — Other Ambulatory Visit: Payer: Self-pay | Admitting: Family Medicine

## 2019-05-13 ENCOUNTER — Encounter: Payer: Self-pay | Admitting: Family Medicine

## 2019-05-13 DIAGNOSIS — N182 Chronic kidney disease, stage 2 (mild): Secondary | ICD-10-CM | POA: Diagnosis not present

## 2019-05-13 NOTE — Progress Notes (Signed)
Cardiology Office Note:    Date:  05/14/2019   ID:  Darren Hamilton, DOB Oct 08, 1960, MRN [id number]  PCP:  Eunice Blase, MD  Cardiologist:  No primary care provider on file.  Electrophysiologist:  None   Referring MD: Eunice Blase, MD   Chief Complaint  Patient presents with  . Leg Swelling    History of Present Illness:    Darren Hamilton is a 58 y.o. male with a hx of hypertension, hyperlipidemia, OSA, diastolic dysfunction, recently diagnosed acute nephrotic syndrome who presents for follow-up.  Patient presented to the [facility] ED on 02/24/2019 with complaint of leg swelling.  Lower extremity duplex showed no evidence of DVT.  BNP was over 1100.  He was started on Lasix 20 mg daily and discharged from the ED.  He did not respond to Lasix and this was changed to torsemide 20 mg BID as an outpatient.  TTE on 02/25/2019 showed normal LV systolic function, grade 1 diastolic dysfunction, mild LVH, normal RV size and function, no significant valvular disease, small/collapsible IVC.   He was referred to cardiology for initial visit on 04/01/2019.  Given his presentation with only mild diastolic dysfunction with significant volume overloaded, labs were checked and showed significant reduction in albumin (2.0) with 4+ proteinuria on urinalysis.  Spot protein to creatinine ratio showed nephrotic range proteinuria.  He was referred to nephrology for further evaluation.  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  He has been started on prednisone.  In addition he is taking torsemide 100 mg daily.  Reports marked improvement in symptoms on this regimen, weight is down 40 pounds.  He reports lower extremity edema and dyspnea are markedly improved.   Past Medical History:  Diagnosis Date  . Anxiety   . Asthma   . Depression   . Hypertension   . Nephrotic syndrome     No past surgical history on file.  Current Medications: Current Meds  Medication Sig  . acetaminophen  (TYLENOL) 325 MG tablet Take 650 mg by mouth every 6 (six) hours as needed for moderate pain or headache.   . albuterol (VENTOLIN HFA) 108 (90 Base) MCG/ACT inhaler Inhale 2 puffs into the lungs every 6 (six) hours as needed for wheezing or shortness of breath.  Marland Kitchen aspirin 81 MG tablet Take 81 mg by mouth daily.  . cetirizine (ZYRTEC) 10 MG tablet Take 10 mg by mouth daily.  . Cholecalciferol (VITAMIN D3) 50 MCG (2000 UT) capsule Take 2,000 Units by mouth daily.   Marland Kitchen escitalopram (LEXAPRO) 20 MG tablet Start 1/2 tab PO qd, may increase to 1 PO qd after 1 week (Patient taking differently: Take 20 mg by mouth daily. )  . fluticasone (FLONASE) 50 MCG/ACT nasal spray Place 2 sprays into both nostrils daily.  Marland Kitchen gemfibrozil (LOPID) 600 MG tablet Take 1 tablet (600 mg total) by mouth 2 (two) times daily before a meal.  . lisinopril (PRINIVIL,ZESTRIL) 10 MG tablet Take 1 tablet (10 mg total) by mouth daily.  . methocarbamol (ROBAXIN) 750 MG tablet Take 750 mg by mouth every 8 (eight) hours as needed for muscle spasms.  . Multiple Vitamin (MULTIVITAMIN) tablet Take 1 tablet by mouth daily.  . potassium chloride SA (KLOR-CON) 20 MEQ tablet Take 20 meq twice a day (Patient taking differently: Take 30 mEq by mouth daily. )  . torsemide (DEMADEX) 100 MG tablet Take 100 mg by mouth daily.     Allergies:   Patient has no known allergies.  Social History   Socioeconomic History  . Marital status: Single    Spouse name: Not on file  . Number of children: Not on file  . Years of education: Not on file  . Highest education level: Not on file  Occupational History  . Not on file  Social Needs  . Financial resource strain: Not on file  . Food insecurity    Worry: Not on file    Inability: Not on file  . Transportation needs    Medical: Not on file    Non-medical: Not on file  Tobacco Use  . Smoking status: Never Smoker  . Smokeless tobacco: Never Used  Substance and Sexual Activity  . Alcohol use:  Not Currently  . Drug use: No  . Sexual activity: Not on file  Lifestyle  . Physical activity    Days per week: Not on file    Minutes per session: Not on file  . Stress: Not on file  Relationships  . Social Musician on phone: Not on file    Gets together: Not on file    Attends religious service: Not on file    Active member of club or organization: Not on file    Attends meetings of clubs or organizations: Not on file    Relationship status: Not on file  Other Topics Concern  . Not on file  Social History Narrative  . Not on file     Family History: Father was diagnosed with CAD in early 30s, underwent CABG and AVR.    ROS:   Please see the history of present illness.     All other systems reviewed and are negative.  EKGs/Labs/Other Studies Reviewed:    The following studies were reviewed today:   EKG:  EKG is  ordered today.  The ekg ordered today demonstrates sinus tachycardia, rate 115, PVC  TTE 02/25/19: 1. Left ventricular ejection fraction, by visual estimation, is 55 to 60%. The left ventricle has normal function. There is mildly increased left ventricular hypertrophy.  2. The mitral valve is normal in structure. No evidence of mitral valve regurgitation. No evidence of mitral stenosis.  3. The aortic valve is tricuspid Aortic valve regurgitation was not visualized by color flow Doppler. Mild aortic valve sclerosis without stenosis.  4. There is mild dilatation of the ascending aorta measuring 38 mm.  5. Global right ventricle has normal systolic function.The right ventricular size is normal. No increase in right ventricular wall thickness.  6. Left atrial size was normal.  7. Right atrial size was normal.  8. Left ventricular diastolic Doppler parameters are consistent with impaired relaxation pattern of LV diastolic filling.  9. The inferior vena cava is normal in size with greater than 50% respiratory variability, suggesting right atrial pressure of  3 mmHg. 10. The tricuspid valve is normal in structure. Tricuspid valve regurgitation was not visualized by color flow Doppler.  Recent Labs: 04/01/2019: ALT 14; BNP 9.0; TSH 8.620 04/09/2019: BUN 20; Creatinine, Ser 1.25; Magnesium 2.1; Potassium 4.4; Sodium 138 04/25/2019: Hemoglobin 13.7; Platelets 369  Recent Lipid Panel    Component Value Date/Time   CHOL 265 (H) 12/25/2018 1700   TRIG 419 (H) 12/25/2018 1700   HDL 52 12/25/2018 1700   CHOLHDL 5.1 (H) 12/25/2018 1700   LDLCALC  12/25/2018 1700     Comment:     . LDL cholesterol not calculated. Triglyceride levels greater than 400 mg/dL invalidate calculated LDL results. . Reference range: <100 .  Desirable range <100 mg/dL for primary prevention;   <70 mg/dL for patients with CHD or diabetic patients  with > or = 2 CHD risk factors. Marland Kitchen. LDL-C is now calculated using the Martin-Hopkins  calculation, which is a validated novel method providing  better accuracy than the Friedewald equation in the  estimation of LDL-C.  Horald PollenMartin SS et al. Lenox AhrJAMA. 1610;960(452013;310(19): 2061-2068  (http://education.[facility].com/faq/FAQ164)     Physical Exam:    VS:  BP 128/84   Pulse (!) 125   Ht 5\' 8"  (1.727 m)   Wt 257 lb 6.4 oz (116.8 kg)   SpO2 93%   BMI 39.14 kg/m     Wt Readings from Last 3 Encounters:  05/14/19 257 lb 6.4 oz (116.8 kg)  04/25/19 281 lb (127.5 kg)  04/01/19 280 lb 12.8 oz (127.4 kg)     GEN:   in no acute distress HEENT: Normal NECK: No JVD  LYMPHATICS: No lymphadenopathy CARDIAC: RRR, no murmurs, rubs, gallops RESPIRATORY:  Clear to auscultation without rales, wheezing or rhonchi  ABDOMEN: Soft, non-tender, non-distended MUSCULOSKELETAL:  Trace LE edema SKIN: Warm and dry NEUROLOGIC:  Alert and oriented x 3 PSYCHIATRIC:  Normal affect   ASSESSMENT:    1. SOB (shortness of breath)   2. Essential hypertension   3. Lower extremity edema    PLAN:    In order of problems listed above:  LE  Edema/dyspnea: Due to nephrotic syndrome, with renal biopsy reportedly showing FSGS.  He is following with nephrology and has been started on prednisone.  Currently taking torsemide 100 mg daily, with marked improvement in symptoms.  Mild diastolic dysfunction on TTE, do not suspect it is contributing much to his presentation.  Continue diuretics per nephrology.  Hypertension: On lisinopril 10 mg daily.  Appears controlled  Hyperlipidemia: Gemfibrozil 600 mg twice daily  OSA: continue APAP  RTC in 1 year   Medication Adjustments/Labs and Tests Ordered: Current medicines are reviewed at length with the patient today.  Concerns regarding medicines are outlined above.  Orders Placed This Encounter  Procedures  . EKG 12-Lead   No orders of the defined types were placed in this encounter.   Patient Instructions  Medication Instructions:  Continue same medications   Lab Work: None ordered   Testing/Procedures: None ordered  Follow-Up: At [facility], you and your health needs are our priority.  As part of our continuing mission to provide you with exceptional heart care, we have created designated Provider Care Teams.  These Care Teams include your primary Cardiologist (physician) and Advanced Practice Providers (APPs -  Physician Assistants and Nurse Practitioners) who all work together to provide you with the care you need, when you need it.  Your next appointment:  1 year   Call in Sept to schedule Dec appointment   The format for your next appointment:  Office   Provider:  Dr.Dublin Grayer      Signed, Little Ishikawahristopher L Carmin Alvidrez, MD  05/14/2019 3:28 PM    [facility]

## 2019-05-14 ENCOUNTER — Other Ambulatory Visit: Payer: Self-pay

## 2019-05-14 ENCOUNTER — Encounter: Payer: Self-pay | Admitting: Cardiology

## 2019-05-14 ENCOUNTER — Ambulatory Visit: Payer: BC Managed Care – PPO | Admitting: Cardiology

## 2019-05-14 VITALS — BP 128/84 | HR 125 | Ht 68.0 in | Wt 257.4 lb

## 2019-05-14 DIAGNOSIS — R0602 Shortness of breath: Secondary | ICD-10-CM

## 2019-05-14 DIAGNOSIS — R600 Localized edema: Secondary | ICD-10-CM

## 2019-05-14 DIAGNOSIS — I10 Essential (primary) hypertension: Secondary | ICD-10-CM

## 2019-05-14 NOTE — Patient Instructions (Signed)
Medication Instructions:  Continue same medications   Lab Work: None ordered   Testing/Procedures: None ordered  Follow-Up: At [facility], you and your health needs are our priority.  As part of our continuing mission to provide you with exceptional heart care, we have created designated Provider Care Teams.  These Care Teams include your primary Cardiologist (physician) and Advanced Practice Providers (APPs -  Physician Assistants and Nurse Practitioners) who all work together to provide you with the care you need, when you need it.  Your next appointment:  1 year   Call in Sept to schedule Dec appointment   The format for your next appointment:  Office   Provider:  Dr.Schumann

## 2019-05-20 DIAGNOSIS — I129 Hypertensive chronic kidney disease with stage 1 through stage 4 chronic kidney disease, or unspecified chronic kidney disease: Secondary | ICD-10-CM | POA: Diagnosis not present

## 2019-05-20 DIAGNOSIS — N041 Nephrotic syndrome with focal and segmental glomerular lesions: Secondary | ICD-10-CM | POA: Diagnosis not present

## 2019-05-20 DIAGNOSIS — N182 Chronic kidney disease, stage 2 (mild): Secondary | ICD-10-CM | POA: Diagnosis not present

## 2019-05-21 ENCOUNTER — Telehealth: Payer: Self-pay

## 2019-05-21 ENCOUNTER — Ambulatory Visit: Payer: BC Managed Care – PPO | Admitting: Neurology

## 2019-05-21 DIAGNOSIS — F39 Unspecified mood [affective] disorder: Secondary | ICD-10-CM | POA: Diagnosis not present

## 2019-05-21 NOTE — Telephone Encounter (Signed)
I attempted to reach the pt and both #'s listed. Pt was not available. 05/21/2019 visit with Dr. Rexene Alberts has to be reschedule due to a family emergency.   Pt has been sent a mychart message asking him to call so we can reschedule.

## 2019-06-06 ENCOUNTER — Other Ambulatory Visit: Payer: Self-pay | Admitting: Family Medicine

## 2019-06-08 DIAGNOSIS — G4733 Obstructive sleep apnea (adult) (pediatric): Secondary | ICD-10-CM | POA: Diagnosis not present

## 2019-06-10 ENCOUNTER — Other Ambulatory Visit: Payer: Self-pay | Admitting: Family Medicine

## 2019-06-10 DIAGNOSIS — N182 Chronic kidney disease, stage 2 (mild): Secondary | ICD-10-CM | POA: Diagnosis not present

## 2019-06-10 MED ORDER — POTASSIUM CHLORIDE CRYS ER 20 MEQ PO TBCR: EXTENDED_RELEASE_TABLET | ORAL | 3 refills | Status: DC

## 2019-06-11 ENCOUNTER — Ambulatory Visit: Payer: BC Managed Care – PPO | Admitting: Neurology

## 2019-06-11 ENCOUNTER — Other Ambulatory Visit: Payer: Self-pay

## 2019-06-11 ENCOUNTER — Encounter: Payer: Self-pay | Admitting: Neurology

## 2019-06-11 VITALS — BP 121/81 | HR 89 | Temp 96.8°F | Ht 68.0 in | Wt 278.0 lb

## 2019-06-11 DIAGNOSIS — G4734 Idiopathic sleep related nonobstructive alveolar hypoventilation: Secondary | ICD-10-CM | POA: Diagnosis not present

## 2019-06-11 DIAGNOSIS — R600 Localized edema: Secondary | ICD-10-CM

## 2019-06-11 DIAGNOSIS — F39 Unspecified mood [affective] disorder: Secondary | ICD-10-CM | POA: Diagnosis not present

## 2019-06-11 DIAGNOSIS — Z9989 Dependence on other enabling machines and devices: Secondary | ICD-10-CM

## 2019-06-11 DIAGNOSIS — G4733 Obstructive sleep apnea (adult) (pediatric): Secondary | ICD-10-CM | POA: Diagnosis not present

## 2019-06-11 NOTE — Patient Instructions (Signed)
Please continue using your autoPAP regularly. While your insurance requires that you use PAP at least 4 hours each night on 70% of the nights, I recommend, that you not skip any nights and use it throughout the night if you can. Getting used to PAP and staying with the treatment long term does take time and patience and discipline. Untreated obstructive sleep apnea when it is moderate to severe can have an adverse impact on cardiovascular health and raise her risk for heart disease, arrhythmias, hypertension, congestive heart failure, stroke and diabetes. Untreated obstructive sleep apnea causes sleep disruption, nonrestorative sleep, and sleep deprivation. This can have an impact on your day to day functioning and cause daytime sleepiness and impairment of cognitive function, memory loss, mood disturbance, and problems focussing. Using PAP regularly can improve these symptoms.  As discussed, we will do an overnight oxygen level test, called ONO, and your DME company will call and set this up for one night, while you also use your autoPAP as usual. We will call you with the results. This is to make sure that your oxygen levels stay in the 90s, while you are treated with autoPAP for your OSA. Remember, your oxygen levels dropped into the higher 60s during the home sleep test.   Also, we will increase your AutoPap maximum pressure from 15 cm to 16 cm at this time.  Please follow-up in 3 months, keep [place] posted by MyChart messaging especially if you have not heard about doing the oxygen monitor test at home or if you do not see a change in your pressure settings.

## 2019-06-11 NOTE — Progress Notes (Signed)
Subjective:    Patient ID: Darren Hamilton is a 59 y.o. male.  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  The patient is unaccompanied today.  I first met him on 02/05/2019 at the request of his primary care physician, at which time the patient reported snoring and excessive daytime somnolence as well as witnessed apneas.  He was advised to proceed with sleep study testing.  He had a home sleep test on 02/20/2019 which showed severe obstructive sleep apnea with a total AHI of 86.4/hour and O2 nadir of 67%.  Since his insurance did not approve a laboratory attended sleep study, he was advised to proceed with AutoPap therapy at home.  Today, 06/11/2019: I reviewed his AutoPap compliance data from 05/11/2019 through 06/09/2019 which is a total of 30 days, during which time he used his machine every night with percent use days greater than 4 hours at 100%, indicating superb compliance with an average usage of 7 hours and 42 minutes, residual AHI borderline at 4.6/h, 95th percentile of pressure at 14.8 cm with a range of 7 cm to 15 cm with EPR of 3, leak on the higher end with a 95th percentile at 21.8 L/min.  He reports feeling improved with regards to his daytime somnolence and nighttime sleep quality and sleep consolidation as well as nocturia.  Nevertheless, he has had interim problems with fluid retention, lower extremity edema.  Ended up seeing cardiology and nephrology.  He had a kidney biopsy which revealed focal glomerular low sclerosis.  He is still fluctuating in terms of his fluid retention.  He has been working on weight loss and lost a lot of fluid weight recently as well.  He is using a full facemask for his  AutoPap, he has adapted well to treatment and is fully compliant with it, motivated to continue with treatment.  He is noticing some redness on his nose and some comedogenic outbreak on the nose and side of the nose, not actually where the mask edges hit the face.  The patient's allergies, current medications, family history, past medical history, past social history, past surgical history and problem list were reviewed and updated as appropriate.   Previously:   02/05/2019: (He) reports snoring, excessive daytime sleepiness and witnessed apneas per wife's report.  I reviewed your office note from 12/25/2018. His Epworth sleepiness score is 19 out of 24, fatigue severity score is 54 out of 63.  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  His bedtime is generally around 11 but sometimes later than that.  His rise time is generally around 7 AM.  He lives with his wife and 1 year old son who has autism.  He has a TV in the bedroom but they hardly use it.  They have 2 cats in the household who do not bother them at night.  He has recently noticed lower extremity edema.  He has been referred for an echocardiogram.  He had a stress test several years ago which was benign per his report.  He has gained over the past several years.  He has rare morning headaches, nocturia about once per average night, admits to drinking quite a bit of caffeine in the form of coffee, about a 8 cup pot per day  and 1 serving of tea.  He does not have any telltale symptoms of restless leg syndrome but is a restless sleeper.  His wife has commented on his loud snoring which is worse when he is on his back.  He tries to sleep on his sides.  He is not aware of any family history of sleep apnea but suspects that his mom may have had it, she was a loud snorer, she passed away recently at age 18, father passed away from complications of heart disease at age 72.  His Past Medical History Is Significant For: Past Medical History:   Diagnosis Date  . Anxiety   . Asthma   . Depression   . Hypertension   . Nephrotic syndrome     His Past Surgical History Is Significant For: History reviewed. No pertinent surgical history.  His Family History Is Significant For: Family History  Problem Relation Age of Onset  . Heart failure Mother   . Heart failure Father     His Social History Is Significant For: Social History   Socioeconomic History  . Marital status: Single    Spouse name: Not on file  . Number of children: Not on file  . Years of education: Not on file  . Highest education level: Not on file  Occupational History  . Not on file  Tobacco Use  . Smoking status: Never Smoker  . Smokeless tobacco: Never Used  Substance and Sexual Activity  . Alcohol use: Yes    Alcohol/week: 3.0 standard drinks    Types: 1 Glasses of wine, 1 Cans of beer, 1 Shots of liquor per week    Comment: occasionally  . Drug use: No  . Sexual activity: Not on file  Other Topics Concern  . Not on file  Social History Narrative  . Not on file   Social Determinants of Health   Financial Resource Strain:   . Difficulty of Paying Living Expenses: Not on file  Food Insecurity:   . Worried About Charity fundraiser in the Last Year: Not on file  . Ran Out of Food in the Last Year: Not on file  Transportation Needs:   . Lack of Transportation (Medical): Not on file  . Lack of Transportation (Non-Medical): Not on file  Physical Activity:   . Days of Exercise per Week: Not on file  . Minutes of Exercise per Session: Not on file  Stress:   . Feeling of Stress : Not on file  Social Connections:   . Frequency of Communication with Friends and Family: Not on file  . Frequency of Social Gatherings with Friends and Family: Not on file  . Attends Religious Services: Not on file  . Active Member of Clubs or Organizations: Not on file  . Attends Archivist Meetings: Not on file  . Marital Status: Not on file     His Allergies Are:  No Known Allergies:   His Current Medications Are:  Outpatient Encounter Medications as of 06/11/2019  Medication Sig  . acetaminophen (TYLENOL) 325 MG tablet Take 650 mg by mouth every 6 (six) hours as needed for moderate pain or headache.   . albuterol (VENTOLIN HFA) 108 (90 Base) MCG/ACT inhaler Inhale 2 puffs into the lungs every 6 (six) hours as needed for wheezing or shortness of breath.  Marland Kitchen aspirin 81 MG tablet Take 81 mg by mouth daily.  . cetirizine (ZYRTEC) 10 MG tablet Take 10 mg by mouth daily.  . Cholecalciferol (  VITAMIN D3) 50 MCG (2000 UT) capsule Take 2,000 Units by mouth daily.   Marland Kitchen escitalopram (LEXAPRO) 20 MG tablet Start 1/2 tab PO qd, may increase to 1 PO qd after 1 week (Patient taking differently: Take 20 mg by mouth daily. )  . fluticasone (FLONASE) 50 MCG/ACT nasal spray Place 2 sprays into both nostrils daily.  Marland Kitchen gemfibrozil (LOPID) 600 MG tablet Take 1 tablet (600 mg total) by mouth 2 (two) times daily before a meal.  . lisinopril (PRINIVIL,ZESTRIL) 10 MG tablet Take 1 tablet (10 mg total) by mouth daily.  . methocarbamol (ROBAXIN) 750 MG tablet Take 750 mg by mouth every 8 (eight) hours as needed for muscle spasms.  . Multiple Vitamin (MULTIVITAMIN) tablet Take 1 tablet by mouth daily.  . potassium chloride SA (KLOR-CON) 20 MEQ tablet Take 20 meq twice a day  . torsemide (DEMADEX) 100 MG tablet Take 100 mg by mouth daily.  . [DISCONTINUED] predniSONE (DELTASONE) 20 MG tablet   . [DISCONTINUED] torsemide (DEMADEX) 10 MG tablet TAKE 1/2 - 1 TABLET BY MOUTH TWICE DAILY AS NEEDED (Patient not taking: Reported on 06/11/2019)  . [DISCONTINUED] torsemide (DEMADEX) 100 MG tablet Take 100 mg by mouth daily.   No facility-administered encounter medications on file as of 06/11/2019.  :  Review of Systems:  Out of a complete 14 point review of systems, all are reviewed and negative with the exception of these symptoms as listed below:  Review of Systems   Constitutional: Negative.   HENT: Negative.   Respiratory: Negative.   Cardiovascular: Positive for leg swelling.  Gastrointestinal: Negative.   Endocrine: Negative.   Genitourinary: Negative.   Musculoskeletal: Positive for back pain.  Skin: Negative.   Neurological: Negative.        Epworth Sleepiness Scale 0= would never doze 1= slight chance of dozing 2= moderate chance of dozing 3= high chance of dozing  Sitting and reading:1 Watching TV:0 Sitting inactive in a public place (ex. Theater or meeting):0 As a passenger in a car for an hour without a break:0 Lying down to rest in the afternoon:2 Sitting and talking to someone:0 Sitting quietly after lunch (no alcohol):0 In a car, while stopped in traffic:0  Total:3   Psychiatric/Behavioral: Negative.     Objective:  Neurological Exam  Physical Exam Physical Examination:   Vitals:   06/11/19 0928  BP: 121/81  Pulse: 89  Temp: (!) 96.8 F (36 C)    General Examination: The patient is a very pleasant 59 y.o. male in no acute distress. He appears well-developed and well-nourished and well groomed.   HEENT: Normocephalic, atraumatic, pupils are equal, round and reactive to light. Corrective eyeglasses in place. Extraocular tracking is good without limitation to gaze excursion or nystagmus noted. Normal smooth pursuit is noted. Hearing is grossly intact. Face is symmetric with normal facial animation and normal facial sensation. Speech is clear with no dysarthria noted. There is no hypophonia. There is no lip, neck/head, jaw or voice tremor. Neck is supple with full range of passive and active motion. There are no carotid bruits on auscultation. Oropharynx exam reveals: mild mouth dryness, adequate dental hygiene and moderate airway crowding.  He has a full beard.  Tongue protrudes centrally in palate elevates symmetrically.  He has Some redness and a couple of pustules on the side of the nose.  Chest: Clear to  auscultation without wheezing, rhonchi or crackles noted.  Heart: S1+S2+0, regular and normal without murmurs, rubs or gallops noted.   Abdomen:  Soft, non-tender and non-distended with normal bowel sounds appreciated on auscultation.  Extremities: There is trace 1+ pitting edema in the distal lower extremities bilaterally. Compression socks up to the knees.   Skin: Warm and dry without trophic changes noted. There are no varicose veins.  Musculoskeletal: exam reveals no obvious joint deformities, tenderness or joint swelling or erythema.   Neurologically:  Mental status: The patient is awake, alert and oriented in all 4 spheres. His immediate and remote memory, attention, language skills and fund of knowledge are appropriate. There is no evidence of aphasia, agnosia, apraxia or anomia. Speech is clear with normal prosody and enunciation. Thought process is linear. Mood is normal and affect is normal.  Cranial nerves II - XII are as described above under HEENT exam. In addition: shoulder shrug is normal with equal shoulder height noted. Motor exam: Normal bulk, strength and tone is noted. There is tremor. Fine motor skills and coordination: grossly intact.  Cerebellar testing: No dysmetria or intention tremor. There is no truncal or gait ataxia.  Sensory exam: intact to light touch.  Gait, station and balance: He stands easily. No veering to one side is noted. No leaning to one side is noted. Posture is age-appropriate and stance is narrow based. Gait shows normal stride length and normal pace. No problems turning are noted.   Assessment and Plan:    In summary, Darren Hamilton is a very pleasant 59 year old male  with an underlying medical history of hypertension, hyperlipidemia, depression and obesity, who Presents for follow-up consultation of his obstructive sleep apnea, which was deemed to be in the severe range by home sleep testing on 02/20/2019.  He has established treatment on  AutoPap therapy and is fully compliant with it.  He is highly commended for his treatment adherence and he also endorses benefit with regards to significant improvement of his daytime somnolence, Improvement of his nocturia and his sleep quality and sleep consolidation, as well as his snoring per wife's report.  He is overall quite pleased with his outcome.  While his AHI is much improved, it is still on the slightly borderline side at 4.6/h on average for the last month.  I suggested we do a couple of things today:I would like to increase his maximum AutoPap pressure to 16 cm.  I would like to pursue an overnight pulse oximetry test one of the upcoming nights while he is using his AutoPap as usual.  This is to ensure that his oxygen saturations are adequate while he is on AutoPap therapy since his O2 nadir was 67% during his home sleep test in September.In the interim, he has been diagnosed with focal Glomerulosclerosis and is seeing a nephrologist.  He has also seen cardiology.  I think it would be beneficial if we optimize his treatment as much as possible.  He is also working on weight loss and is encouraged to continue his weight loss endeavors.  I would like to see him back in 3 months for a recheck.  If his ONO is abnormal, He will likely benefit from actually coming in for an overnight sleep study for proper titration with CPAP or even BiPAP if needed.  He is agreeable with the plan.  I answered all his questions today and plan to see him back in 3 months, we will keep him posted as to his overnight pulse oximetry test results in the interim by phone call.  I spent 25 minutes in total face-to-face time with the patient, more than  50% of which was spent in counseling and coordination of care, reviewing test results, reviewing medication and discussing or reviewing the diagnosis of OSA, its prognosis and treatment options. Pertinent laboratory and imaging test results that were available during this visit  with the patient were reviewed by me and considered in my medical decision making (see chart for details).

## 2019-06-13 ENCOUNTER — Other Ambulatory Visit (INDEPENDENT_AMBULATORY_CARE_PROVIDER_SITE_OTHER): Payer: Self-pay | Admitting: Family Medicine

## 2019-06-14 ENCOUNTER — Telehealth: Payer: Self-pay | Admitting: Neurology

## 2019-06-14 DIAGNOSIS — J449 Chronic obstructive pulmonary disease, unspecified: Secondary | ICD-10-CM | POA: Diagnosis not present

## 2019-06-14 DIAGNOSIS — R0902 Hypoxemia: Secondary | ICD-10-CM | POA: Diagnosis not present

## 2019-06-14 NOTE — Telephone Encounter (Signed)
Received ONO results for the patient. I have placed in Dr Teofilo Pod in basket to review and result for the patient and then will contact patient.

## 2019-06-17 NOTE — Telephone Encounter (Signed)
Called the patient to review the ONO results for the patient. There was no answer. LVM informing the patient that the ono showed oxygen levels were good under the CPAP pressure. Advised that there would not be any need for any additional oxygen. LVM with this information and advised the patient to call with any questions they may have.

## 2019-06-17 NOTE — Telephone Encounter (Signed)
I reviewed patient's pulse oximetry test result from 06/14/2019, total duration of testing was 6 hours and 55 minutes, room air while on AutoPap. Average oxygen saturation was 94%, nadir was 84%, time below or at 88% saturation was 43 seconds. Please advise Darren Hamilton that his oxygen saturations were adequate and that he is treated well with AutoPap and can follow-up routinely in 3 mo as planned.

## 2019-06-25 DIAGNOSIS — F39 Unspecified mood [affective] disorder: Secondary | ICD-10-CM | POA: Diagnosis not present

## 2019-07-09 DIAGNOSIS — F39 Unspecified mood [affective] disorder: Secondary | ICD-10-CM | POA: Diagnosis not present

## 2019-07-23 DIAGNOSIS — F39 Unspecified mood [affective] disorder: Secondary | ICD-10-CM | POA: Diagnosis not present

## 2019-07-30 DIAGNOSIS — D225 Melanocytic nevi of trunk: Secondary | ICD-10-CM | POA: Diagnosis not present

## 2019-07-30 DIAGNOSIS — L814 Other melanin hyperpigmentation: Secondary | ICD-10-CM | POA: Diagnosis not present

## 2019-07-30 DIAGNOSIS — L718 Other rosacea: Secondary | ICD-10-CM | POA: Diagnosis not present

## 2019-07-30 DIAGNOSIS — D2262 Melanocytic nevi of left upper limb, including shoulder: Secondary | ICD-10-CM | POA: Diagnosis not present

## 2019-08-06 DIAGNOSIS — F39 Unspecified mood [affective] disorder: Secondary | ICD-10-CM | POA: Diagnosis not present

## 2019-08-13 DIAGNOSIS — N182 Chronic kidney disease, stage 2 (mild): Secondary | ICD-10-CM | POA: Diagnosis not present

## 2019-08-15 DIAGNOSIS — N182 Chronic kidney disease, stage 2 (mild): Secondary | ICD-10-CM | POA: Diagnosis not present

## 2019-08-20 DIAGNOSIS — N1831 Chronic kidney disease, stage 3a: Secondary | ICD-10-CM | POA: Diagnosis not present

## 2019-08-20 DIAGNOSIS — N041 Nephrotic syndrome with focal and segmental glomerular lesions: Secondary | ICD-10-CM | POA: Diagnosis not present

## 2019-08-20 DIAGNOSIS — I129 Hypertensive chronic kidney disease with stage 1 through stage 4 chronic kidney disease, or unspecified chronic kidney disease: Secondary | ICD-10-CM | POA: Diagnosis not present

## 2019-08-30 ENCOUNTER — Other Ambulatory Visit (INDEPENDENT_AMBULATORY_CARE_PROVIDER_SITE_OTHER): Payer: Self-pay | Admitting: Family Medicine

## 2019-09-02 DIAGNOSIS — N1831 Chronic kidney disease, stage 3a: Secondary | ICD-10-CM | POA: Diagnosis not present

## 2019-09-03 DIAGNOSIS — F39 Unspecified mood [affective] disorder: Secondary | ICD-10-CM | POA: Diagnosis not present

## 2019-09-10 ENCOUNTER — Ambulatory Visit: Payer: BC Managed Care – PPO | Admitting: Neurology

## 2019-09-10 ENCOUNTER — Other Ambulatory Visit: Payer: Self-pay

## 2019-09-10 ENCOUNTER — Encounter: Payer: Self-pay | Admitting: Neurology

## 2019-09-10 VITALS — BP 150/99 | HR 120 | Ht 68.0 in | Wt 282.0 lb

## 2019-09-10 DIAGNOSIS — Z9989 Dependence on other enabling machines and devices: Secondary | ICD-10-CM | POA: Diagnosis not present

## 2019-09-10 DIAGNOSIS — G4733 Obstructive sleep apnea (adult) (pediatric): Secondary | ICD-10-CM | POA: Diagnosis not present

## 2019-09-10 NOTE — Progress Notes (Signed)
Subjective:    Patient ID: Darren Hamilton is a 59 y.o. male.  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  The patient is unaccompanied today. I saw him on 06/11/2019, at which time he was fully compliant with his AutoPap, he felt like he was sleeping better.  He had an interim issue with his kidneys, was diagnosed with focal glomerulosclerosis.  He had some issues with the nasal mass causing almost like an acne-like outbreak around the nose.  He had lost weight.  I suggested we proceed with an overnight pulse oximetry test to make sure his oxygen saturations were adequate while in was on his AutoPap therapy.  He had a overnight pulse oximetry test on 06/14/2019 which showed an average oxygen saturation of 94%, nadir was 84%, time below or at 88% saturation while on room air on AutoPap was 43 seconds.  We attempted to call him with his test results.  He was advised to continue with AutoPap therapy.  Today, 09/09/2019: I reviewed his AutoPap compliance data from 08/10/2019 through 09/08/2019, which is a total of 30 days, during which time he used his machine every night with percent use days greater than 4 hours at 100%, indicating superb compliance with an average usage of 6 hours and 55 minutes, residual AHI borderline at 4.9/h, average pressure for the 95th percentile at 15.8 cm, leak high with a 95th percentile at 53.1 L/min on a pressure range of 7 cm to 16 cm with EPR.  We also reviewed his overnight pulse oximetry test results from 06/14/2019.  He has been compliant with his CPAP, continues to benefit from it but reports significant weight fluctuations, particularly with fluid weight.  He is currently on prednisone.  He was treated with a diuretic recently,  follows closely with nephrology.  He keeps his beard trimmed, the full facemask is still comfortable enough with the memory foam and he is mindful about changing his supplies on a timely basis but has noticed air leaking from the mouth, he has a tendency to have his mouth open at night.  He wants to continue to work on weight loss but it is frustrating for him because of exercise intolerance, feeling fatigued, and fluid retention.  He is motivated to continue with his AutoPap.   The patient's allergies, current medications, family history, past medical history, past social history, past surgical history and problem list were reviewed and updated as appropriate.    Previously:    I first met him on 02/05/2019 at the request of his primary care physician, at which time the patient reported snoring and excessive daytime somnolence as well as witnessed apneas.  He was advised to proceed with sleep study testing.  He had a home sleep test on 02/20/2019 which showed severe obstructive sleep apnea with a total AHI of 86.4/hour and O2 nadir of 67%.  Since his insurance did not approve a laboratory attended sleep study, he was advised to proceed with AutoPap therapy at home.   I reviewed his AutoPap compliance data from 05/11/2019 through 06/09/2019 which is a total of 30 days, during which time he used his machine every night with percent use days greater than 4 hours at 100%, indicating superb compliance with an average usage of 7 hours and 42 minutes, residual  AHI borderline at 4.6/h, 95th percentile of pressure at 14.8 cm with a range of 7 cm to 15 cm with EPR of 3, leak on the higher end with a 95th percentile at 21.8 L/min.      02/05/2019: (He) reports snoring, excessive daytime sleepiness and witnessed apneas per wife's report.  I reviewed your office note from 12/25/2018. His Epworth sleepiness score is 19 out of 24, fatigue severity score is 54 out of 63.  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  His  bedtime is generally around 11 but sometimes later than that.  His rise time is generally around 7 AM.  He lives with his wife and 52 year old son who has autism.  He has a TV in the bedroom but they hardly use it.  They have 2 cats in the household who do not bother them at night.  He has recently noticed lower extremity edema.  He has been referred for an echocardiogram.  He had a stress test several years ago which was benign per his report.  He has gained over the past several years.  He has rare morning headaches, nocturia about once per average night, admits to drinking quite a bit of caffeine in the form of coffee, about a 8 cup pot per day and 1 serving of tea.  He does not have any telltale symptoms of restless leg syndrome but is a restless sleeper.  His wife has commented on his loud snoring which is worse when he is on his back.  He tries to sleep on his sides.  He is not aware of any family history of sleep apnea but suspects that his mom may have had it, she was a loud snorer, she passed away recently at age 88, father passed away from complications of heart disease at age 26.  His Past Medical History Is Significant For: Past Medical History:  Diagnosis Date  . Anxiety   . Asthma   . Depression   . Hypertension   . Nephrotic syndrome     His Past Surgical History Is Significant For: No past surgical history on file.  His Family History Is Significant For: Family History  Problem Relation Age of Onset  . Heart failure Mother   . Heart failure Father     His Social History Is Significant For: Social History   Socioeconomic History  . Marital status: Single    Spouse name: Not on file  . Number of children: Not on file  . Years of education: Not on file  . Highest education level: Not on file  Occupational History  . Not on file  Tobacco Use  . Smoking status: Never Smoker  . Smokeless tobacco: Never Used  Substance and Sexual Activity  . Alcohol use: Yes     Alcohol/week: 3.0 standard drinks    Types: 1 Glasses of wine, 1 Cans of beer, 1 Shots of liquor per week    Comment: occasionally  . Drug use: No  . Sexual activity: Not on file  Other Topics Concern  . Not on file  Social History Narrative  . Not on file   Social Determinants of Health   Financial Resource Strain:   . Difficulty of Paying Living Expenses:   Food Insecurity:   . Worried About Charity fundraiser in the Last Year:   . Arboriculturist in the Last Year:   Transportation Needs:   . Film/video editor (Medical):   Marland Kitchen Lack  of Transportation (Non-Medical):   Physical Activity:   . Days of Exercise per Week:   . Minutes of Exercise per Session:   Stress:   . Feeling of Stress :   Social Connections:   . Frequency of Communication with Friends and Family:   . Frequency of Social Gatherings with Friends and Family:   . Attends Religious Services:   . Active Member of Clubs or Organizations:   . Attends Archivist Meetings:   Marland Kitchen Marital Status:     His Allergies Are:  No Known Allergies:   His Current Medications Are:  Outpatient Encounter Medications as of 09/10/2019  Medication Sig  . acetaminophen (TYLENOL) 325 MG tablet Take 650 mg by mouth every 6 (six) hours as needed for moderate pain or headache.   . albuterol (VENTOLIN HFA) 108 (90 Base) MCG/ACT inhaler Inhale 2 puffs into the lungs every 6 (six) hours as needed for wheezing or shortness of breath.  Marland Kitchen aspirin 81 MG tablet Take 81 mg by mouth daily.  . cetirizine (ZYRTEC) 10 MG tablet Take 10 mg by mouth daily.  . Cholecalciferol (VITAMIN D3) 50 MCG (2000 UT) capsule Take 2,000 Units by mouth daily.   Marland Kitchen escitalopram (LEXAPRO) 20 MG tablet Start 1/2 tab PO qd, may increase to 1 PO qd after 1 week (Patient taking differently: Take 20 mg by mouth daily. )  . fluticasone (FLONASE) 50 MCG/ACT nasal spray Place 2 sprays into both nostrils daily.  Marland Kitchen gemfibrozil (LOPID) 600 MG tablet TAKE 1 TABLET (600  MG TOTAL) BY MOUTH 2 (TWO) TIMES DAILY BEFORE A MEAL.  Marland Kitchen lisinopril (ZESTRIL) 10 MG tablet TAKE 1 TABLET BY MOUTH EVERY DAY  . methocarbamol (ROBAXIN) 750 MG tablet Take 750 mg by mouth every 8 (eight) hours as needed for muscle spasms.  . Multiple Vitamin (MULTIVITAMIN) tablet Take 1 tablet by mouth daily.  . potassium chloride SA (KLOR-CON) 20 MEQ tablet Take 20 meq twice a day  . predniSONE (DELTASONE) 20 MG tablet PLEASE SEE ATTACHED FOR DETAILED DIRECTIONS  . torsemide (DEMADEX) 100 MG tablet Take 100 mg by mouth daily.   No facility-administered encounter medications on file as of 09/10/2019.  :  Review of Systems:  Out of a complete 14 point review of systems, all are reviewed and negative with the exception of these symptoms as listed below: Review of Systems  Neurological:       Pt presents today to discuss his cpap. Pt is having trouble finding a good mask fit.    Objective:  Neurological Exam  Physical Exam Physical Examination:   Vitals:   09/10/19 1518  BP: (!) 150/99  Pulse: (!) 120    General Examination: The patient is a very pleasant 59 y.o. male in no acute distress. He appears well-developed and well-nourished and well groomed.   HEENT:Normocephalic, atraumatic, pupils are equal, round and reactive to light. Corrective eyeglasses in place. Extraocular tracking is good without limitation to gaze excursion or nystagmus noted. Normal smooth pursuit is noted. Hearing is grossly intact. Face is symmetric with normal facial animation and normal facial sensation. Speech is clear with no dysarthria noted. There is no hypophonia. There is no lip, neck/head, jaw or voice tremor. Neck shows FROM. Oropharynx exam reveals: mildmouth dryness, adequatedental hygiene and moderateairway crowding. He has a full beard. Tongue protrudes centrally in palate elevates symmetrically.   Chest:Clear to auscultation without wheezing, rhonchi or crackles noted.  Heart:S1+S2+0,  regular and normal without murmurs, rubs or gallops  noted.   Abdomen:Soft, non-tender and non-distended with normal bowel sounds appreciated on auscultation.  Extremities:There isno pitting edema in the distal lower extremities bilaterally. Compression socks up to the knees.   Skin: Warm and dry without trophic changes noted.   Musculoskeletal: exam reveals no obvious joint deformities, tenderness or joint swelling or erythema.   Neurologically:  Mental status: The patient is awake, alert and oriented in all 4 spheres.Hisimmediate and remote memory, attention, language skills and fund of knowledge are appropriate. There is no evidence of aphasia, agnosia, apraxia or anomia. Speech is clear with normal prosody and enunciation. Thought process is linear. Mood is normaland affect is normal.  Cranial nerves II - XII are as described above under HEENT exam. In addition: shoulder shrug is normal with equal shoulder height noted. Motor exam: Normal bulk, strength and tone is noted. There istremor. Fine motor skills and coordination:grosslyintact.  Cerebellar testing: No dysmetria or intention tremor. There is no truncal or gait ataxia.  Sensory exam: intact to light touch.  Gait, station and balance:Hestands easily. No veering to one side is noted. No leaning to one side is noted. Posture is age-appropriate and stance is narrow based. Gait showsnormalstride length and normalpace. No problems turning are noted.   Assessmentand Plan:   In summary,Darren Peteryis a very pleasant 33 year oldmalewith an underlying medical history of hypertension, hyperlipidemia, depression and obesity, who Presents for follow-up consultation of his obstructive sleep apnea, which was deemed to be in the severe range by home sleep testing on 02/20/2019.  He continues to be fully compliant with his AutoPap therapy and is commended for this.  We did an ONO while he was on AutoPap therapy to make sure  his oxygen saturations were adequate.  He had a few minor desaturations into the 80s, overall adequate oxygen saturations, time below or at 88% saturation was less than 1 minute.  He is having some trouble with the mask fit.  He is using a full facemask with a memory foam lining and is tolerating this well.  We mutually agreed to try him on a chinstrap.  I wrote for chinstrap to be provided by his DME company.  He is advised to continue to be fully compliant with his AutoPap and continue to work on weight loss as much as possible.  He is currently on prednisone through nephrology.  He is followed with them on a regular basis.  He is advised to follow-up in sleep clinic to see the nurse practitioner in 6 months, sooner if needed.  I answered all his questions today and he was in agreement. I spent 20 minutes in total face-to-face time and in reviewing records during pre-charting, more than 50% of which was spent in counseling and coordination of care, reviewing test results, reviewing medications and treatment regimen and/or in discussing or reviewing the diagnosis of OSA, the prognosis and treatment options. Pertinent laboratory and imaging test results that were available during this visit with the patient were reviewed by me and considered in my medical decision making (see chart for details).

## 2019-09-10 NOTE — Progress Notes (Signed)
Order for chinstrap sent to [employer] via community message. Confirmation received that the order transmitted was successful.

## 2019-09-10 NOTE — Patient Instructions (Signed)
Please continue using your autoPAP regularly. While your insurance requires that you use PAP at least 4 hours each night on 70% of the nights, I recommend, that you not skip any nights and use it throughout the night if you can. Getting used to PAP and staying with the treatment long term does take time and patience and discipline. Untreated obstructive sleep apnea when it is moderate to severe can have an adverse impact on cardiovascular health and raise her risk for heart disease, arrhythmias, hypertension, congestive heart failure, stroke and diabetes. Untreated obstructive sleep apnea causes sleep disruption, nonrestorative sleep, and sleep deprivation. This can have an impact on your day to day functioning and cause daytime sleepiness and impairment of cognitive function, memory loss, mood disturbance, and problems focussing. Using PAP regularly can improve these symptoms.  We will try a chinstrap, to see if the air leak from the mouth improves.   Keep up the good work with your autoPAP machine. Please follow up in 6 months to see Aundra Millet or Amy, NP.

## 2019-09-17 DIAGNOSIS — F39 Unspecified mood [affective] disorder: Secondary | ICD-10-CM | POA: Diagnosis not present

## 2019-09-23 DIAGNOSIS — F840 Autistic disorder: Secondary | ICD-10-CM | POA: Diagnosis not present

## 2019-09-23 DIAGNOSIS — Z68.41 Body mass index (BMI) pediatric, greater than or equal to 95th percentile for age: Secondary | ICD-10-CM | POA: Diagnosis not present

## 2019-09-23 DIAGNOSIS — F6381 Intermittent explosive disorder: Secondary | ICD-10-CM | POA: Diagnosis not present

## 2019-10-01 DIAGNOSIS — G4733 Obstructive sleep apnea (adult) (pediatric): Secondary | ICD-10-CM | POA: Diagnosis not present

## 2019-10-01 DIAGNOSIS — F39 Unspecified mood [affective] disorder: Secondary | ICD-10-CM | POA: Diagnosis not present

## 2019-10-08 DIAGNOSIS — N1831 Chronic kidney disease, stage 3a: Secondary | ICD-10-CM | POA: Diagnosis not present

## 2019-10-15 DIAGNOSIS — F39 Unspecified mood [affective] disorder: Secondary | ICD-10-CM | POA: Diagnosis not present

## 2019-10-17 DIAGNOSIS — I129 Hypertensive chronic kidney disease with stage 1 through stage 4 chronic kidney disease, or unspecified chronic kidney disease: Secondary | ICD-10-CM | POA: Diagnosis not present

## 2019-10-17 DIAGNOSIS — N1831 Chronic kidney disease, stage 3a: Secondary | ICD-10-CM | POA: Diagnosis not present

## 2019-10-17 DIAGNOSIS — N041 Nephrotic syndrome with focal and segmental glomerular lesions: Secondary | ICD-10-CM | POA: Diagnosis not present

## 2019-10-29 DIAGNOSIS — F39 Unspecified mood [affective] disorder: Secondary | ICD-10-CM | POA: Diagnosis not present

## 2019-11-05 ENCOUNTER — Other Ambulatory Visit: Payer: Self-pay

## 2019-11-05 ENCOUNTER — Emergency Department (HOSPITAL_BASED_OUTPATIENT_CLINIC_OR_DEPARTMENT_OTHER)
Admit: 2019-11-05 | Discharge: 2019-11-05 | Disposition: A | Discharge status: Home / Self Care | Payer: BC Managed Care – PPO | Attending: Emergency Medicine | Admitting: Emergency Medicine

## 2019-11-05 ENCOUNTER — Emergency Department (HOSPITAL_COMMUNITY)
Admission: EM | Admit: 2019-11-05 | Discharge: 2019-11-05 | Disposition: A | Discharge status: Home / Self Care | Payer: BC Managed Care – PPO | Source: Home / Self Care | Attending: Emergency Medicine | Admitting: Emergency Medicine

## 2019-11-05 ENCOUNTER — Encounter (HOSPITAL_COMMUNITY): Payer: Self-pay

## 2019-11-05 ENCOUNTER — Emergency Department (HOSPITAL_COMMUNITY): Payer: BC Managed Care – PPO

## 2019-11-05 DIAGNOSIS — R918 Other nonspecific abnormal finding of lung field: Secondary | ICD-10-CM | POA: Diagnosis not present

## 2019-11-05 DIAGNOSIS — N049 Nephrotic syndrome with unspecified morphologic changes: Secondary | ICD-10-CM | POA: Diagnosis not present

## 2019-11-05 DIAGNOSIS — G9349 Other encephalopathy: Secondary | ICD-10-CM | POA: Diagnosis not present

## 2019-11-05 DIAGNOSIS — M660 Rupture of popliteal cyst: Secondary | ICD-10-CM | POA: Diagnosis not present

## 2019-11-05 DIAGNOSIS — J9601 Acute respiratory failure with hypoxia: Secondary | ICD-10-CM | POA: Diagnosis not present

## 2019-11-05 DIAGNOSIS — I10 Essential (primary) hypertension: Secondary | ICD-10-CM | POA: Diagnosis not present

## 2019-11-05 DIAGNOSIS — R2242 Localized swelling, mass and lump, left lower limb: Secondary | ICD-10-CM | POA: Insufficient documentation

## 2019-11-05 DIAGNOSIS — M79609 Pain in unspecified limb: Secondary | ICD-10-CM

## 2019-11-05 DIAGNOSIS — I130 Hypertensive heart and chronic kidney disease with heart failure and stage 1 through stage 4 chronic kidney disease, or unspecified chronic kidney disease: Secondary | ICD-10-CM | POA: Diagnosis not present

## 2019-11-05 DIAGNOSIS — E790 Hyperuricemia without signs of inflammatory arthritis and tophaceous disease: Secondary | ICD-10-CM | POA: Diagnosis not present

## 2019-11-05 DIAGNOSIS — I5031 Acute diastolic (congestive) heart failure: Secondary | ICD-10-CM | POA: Diagnosis not present

## 2019-11-05 DIAGNOSIS — J45909 Unspecified asthma, uncomplicated: Secondary | ICD-10-CM | POA: Diagnosis not present

## 2019-11-05 DIAGNOSIS — J810 Acute pulmonary edema: Secondary | ICD-10-CM | POA: Diagnosis not present

## 2019-11-05 DIAGNOSIS — E785 Hyperlipidemia, unspecified: Secondary | ICD-10-CM | POA: Diagnosis not present

## 2019-11-05 DIAGNOSIS — R601 Generalized edema: Secondary | ICD-10-CM | POA: Diagnosis not present

## 2019-11-05 DIAGNOSIS — G92 Toxic encephalopathy: Secondary | ICD-10-CM | POA: Diagnosis not present

## 2019-11-05 DIAGNOSIS — F329 Major depressive disorder, single episode, unspecified: Secondary | ICD-10-CM | POA: Diagnosis not present

## 2019-11-05 DIAGNOSIS — F320 Major depressive disorder, single episode, mild: Secondary | ICD-10-CM | POA: Diagnosis not present

## 2019-11-05 DIAGNOSIS — I509 Heart failure, unspecified: Secondary | ICD-10-CM | POA: Diagnosis not present

## 2019-11-05 DIAGNOSIS — R0902 Hypoxemia: Secondary | ICD-10-CM | POA: Diagnosis not present

## 2019-11-05 DIAGNOSIS — L03116 Cellulitis of left lower limb: Secondary | ICD-10-CM | POA: Diagnosis not present

## 2019-11-05 DIAGNOSIS — N19 Unspecified kidney failure: Secondary | ICD-10-CM | POA: Diagnosis not present

## 2019-11-05 DIAGNOSIS — Z7952 Long term (current) use of systemic steroids: Secondary | ICD-10-CM | POA: Diagnosis not present

## 2019-11-05 DIAGNOSIS — J181 Lobar pneumonia, unspecified organism: Secondary | ICD-10-CM | POA: Diagnosis not present

## 2019-11-05 DIAGNOSIS — J811 Chronic pulmonary edema: Secondary | ICD-10-CM | POA: Diagnosis not present

## 2019-11-05 DIAGNOSIS — N179 Acute kidney failure, unspecified: Secondary | ICD-10-CM | POA: Diagnosis not present

## 2019-11-05 DIAGNOSIS — Z20822 Contact with and (suspected) exposure to covid-19: Secondary | ICD-10-CM | POA: Diagnosis not present

## 2019-11-05 DIAGNOSIS — R600 Localized edema: Secondary | ICD-10-CM | POA: Diagnosis not present

## 2019-11-05 DIAGNOSIS — R52 Pain, unspecified: Secondary | ICD-10-CM | POA: Diagnosis not present

## 2019-11-05 DIAGNOSIS — N1831 Chronic kidney disease, stage 3a: Secondary | ICD-10-CM | POA: Diagnosis not present

## 2019-11-05 DIAGNOSIS — M7989 Other specified soft tissue disorders: Secondary | ICD-10-CM | POA: Diagnosis not present

## 2019-11-05 DIAGNOSIS — Z8249 Family history of ischemic heart disease and other diseases of the circulatory system: Secondary | ICD-10-CM | POA: Diagnosis not present

## 2019-11-05 DIAGNOSIS — Z79899 Other long term (current) drug therapy: Secondary | ICD-10-CM | POA: Insufficient documentation

## 2019-11-05 DIAGNOSIS — F419 Anxiety disorder, unspecified: Secondary | ICD-10-CM | POA: Diagnosis not present

## 2019-11-05 DIAGNOSIS — N041 Nephrotic syndrome with focal and segmental glomerular lesions: Secondary | ICD-10-CM | POA: Diagnosis not present

## 2019-11-05 DIAGNOSIS — M109 Gout, unspecified: Secondary | ICD-10-CM | POA: Diagnosis not present

## 2019-11-05 DIAGNOSIS — Z91048 Other nonmedicinal substance allergy status: Secondary | ICD-10-CM | POA: Diagnosis not present

## 2019-11-05 DIAGNOSIS — N170 Acute kidney failure with tubular necrosis: Secondary | ICD-10-CM | POA: Diagnosis not present

## 2019-11-05 DIAGNOSIS — M25562 Pain in left knee: Secondary | ICD-10-CM | POA: Diagnosis not present

## 2019-11-05 DIAGNOSIS — I129 Hypertensive chronic kidney disease with stage 1 through stage 4 chronic kidney disease, or unspecified chronic kidney disease: Secondary | ICD-10-CM | POA: Diagnosis not present

## 2019-11-05 DIAGNOSIS — R0602 Shortness of breath: Secondary | ICD-10-CM | POA: Diagnosis not present

## 2019-11-05 DIAGNOSIS — Z6841 Body Mass Index (BMI) 40.0 and over, adult: Secondary | ICD-10-CM | POA: Diagnosis not present

## 2019-11-05 MED ORDER — OXYCODONE-ACETAMINOPHEN 5-325 MG PO TABS
1.0000 | ORAL_TABLET | Freq: Once | ORAL | Status: AC
  Administered 2019-11-05: 1 via ORAL
  Filled 2019-11-05: qty 1

## 2019-11-05 MED ORDER — HYDROCODONE-ACETAMINOPHEN 5-325 MG PO TABS: 1.0000 | ORAL_TABLET | Freq: Four times a day (QID) | ORAL | 0 refills | Status: DC | PRN

## 2019-11-05 NOTE — Progress Notes (Signed)
Venous duplex       has been completed. Preliminary results can be found under CV proc through chart review. Lemario Chaikin, BS, RDMS, RVT   

## 2019-11-05 NOTE — Discharge Instructions (Addendum)
Please read instructions below. Elevate your leg as much as possible. Apply an ace wrap for compression. You can take hydrocodone every 6 hours as needed for severe pain. Follow with your primary care provider. Return to the ED for concerning symptoms.

## 2019-11-05 NOTE — ED Triage Notes (Signed)
Patient states yesterday he woke up with left mid thigh to mid calf pain and swelling in his leg especially the left knee. Patient denies any injury

## 2019-11-05 NOTE — ED Provider Notes (Signed)
[facility] DEPT Provider Note   CSN: [id number] Arrival date & time: 11/05/19  0830     History Chief Complaint  Patient presents with  . Leg Pain    Darren Hamilton is a 59 y.o. male w PMHx nephrotic syndrome, athma, HTN, presenting to the ED with complaint of gradual onset of left leg pain that began yesterday. Pain is generalized to the leg though is worse about the left knee. He also has swelling to the leg, though denies redness. He has no pain in the hip. No hx of DVT. Does not use tobacco, no prolonged immobilization. He has treated his sx with tylenol. 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  The history is provided by the patient.       Past Medical History:  Diagnosis Date  . Anxiety   . Asthma   . Depression   . Hypertension   . Nephrotic syndrome     Patient Active Problem List   Diagnosis Date Noted  . Seasonal allergies 06/26/2018  . Hyperlipidemia 06/26/2018  . Essential hypertension 06/26/2018  . Depression 11/09/2014    Past Surgical History:  Procedure Laterality Date  . RENAL BIOPSY         Family History  Problem Relation Age of Onset  . Heart failure Mother   . Heart failure Father     Social History   Tobacco Use  . Smoking status: Never Smoker  . Smokeless tobacco: Never Used  Substance Use Topics  . Alcohol use: Yes    Alcohol/week: 3.0 standard drinks    Types: 1 Glasses of wine, 1 Cans of beer, 1 Shots of liquor per week    Comment: occasionally  . Drug use: No    Home Medications Prior to Admission medications   Medication Sig Start Date End Date Taking? Authorizing Provider  acetaminophen (TYLENOL) 500 MG tablet Take 500-1,000 mg by mouth every 6 (six) hours as needed for mild pain or moderate pain.   Yes [provider]  albuterol (VENTOLIN HFA) 108 (90 Base) MCG/ACT inhaler Inhale 2 puffs into the lungs every 6 (six) hours as needed for wheezing or shortness of breath. 12/25/18  Yes  Hilts, Legrand Como, MD  cetirizine (ZYRTEC) 10 MG tablet Take 10 mg by mouth daily.   Yes [provider]  Cholecalciferol (DIALYVITE VITAMIN D 5000 PO) Take 1 capsule by mouth daily.   Yes [provider]  escitalopram (LEXAPRO) 20 MG tablet Start 1/2 tab PO qd, may increase to 1 PO qd after 1 week Patient taking differently: Take 20 mg by mouth daily.  12/25/18  Yes Hilts, Legrand Como, MD  gemfibrozil (LOPID) 600 MG tablet TAKE 1 TABLET (600 MG TOTAL) BY MOUTH 2 (TWO) TIMES DAILY BEFORE A MEAL. Patient taking differently: Take 600 mg by mouth 2 (two) times daily with a meal.  08/30/19  Yes Hilts, Legrand Como, MD  lisinopril (ZESTRIL) 10 MG tablet TAKE 1 TABLET BY MOUTH EVERY DAY Patient taking differently: Take 10 mg by mouth daily.  06/13/19  Yes Hilts, Legrand Como, MD  methocarbamol (ROBAXIN) 750 MG tablet Take 750 mg by mouth every 6 (six) hours as needed for muscle spasms (pain).    Yes [provider]  metolazone (ZAROXOLYN) 5 MG tablet Take 5 mg by mouth daily. 09/22/19  Yes [provider]  Multiple Vitamin (MULTIVITAMIN) tablet Take 1 tablet by mouth daily.   Yes [provider]  potassium chloride (KLOR-CON) 10 MEQ tablet Take 10 mEq  by mouth in the morning, at noon, in the evening, and at bedtime.   Yes [provider]  predniSONE (DELTASONE) 20 MG tablet Take 10 mg by mouth See admin instructions. Take 1/2 tab twice a week. 08/20/19  Yes [provider]  tacrolimus (PROGRAF) 5 MG capsule Take 5 mg by mouth 2 (two) times daily.   Yes [provider]  torsemide (DEMADEX) 100 MG tablet Take 100 mg by mouth daily.   Yes [provider]  HYDROcodone-acetaminophen (NORCO/VICODIN) 5-325 MG tablet Take 1-2 tablets by mouth every 6 (six) hours as needed for severe pain. 11/05/19   Lala Been, [place] N, PA-C  potassium chloride SA (KLOR-CON) 20 MEQ tablet Take 20 meq twice a day Patient not taking: Reported on 11/05/2019 06/10/19   Hilts,  Casimiro Needle, MD    Allergies    Patient has no known allergies.  Review of Systems   Review of Systems  All other systems reviewed and are negative.   Physical Exam Updated Vital Signs BP (!) 140/105   Pulse 97   Temp 97.7 F (36.5 C) (Oral)   Resp 16   Ht 5\' 8"  (1.727 m)   Wt 130.6 kg   SpO2 97%   BMI 43.79 kg/m   Physical Exam Vitals and nursing note reviewed.  Constitutional:      General: He is not in acute distress.    Appearance: He is well-developed.  HENT:     Head: Normocephalic and atraumatic.  Eyes:     Conjunctiva/sclera: Conjunctivae normal.  Cardiovascular:     Rate and Rhythm: Tachycardia present.  Pulmonary:     Effort: Pulmonary effort is normal.  Abdominal:     Palpations: Abdomen is soft.  Musculoskeletal:     Comments: Left leg with generalized swelling and tenderness. TTP about the knee and popliteal fossa. Pain with ROM of the knee. Intact DP pulses. Normal distal sensation.  Skin:    General: Skin is warm.  Neurological:     Mental Status: He is alert.  Psychiatric:        Behavior: Behavior normal.     ED Results / Procedures / Treatments   Labs (all labs ordered are listed, but only abnormal results are displayed) Labs Reviewed - No data to display  EKG None  Radiology DG Knee Complete 4 Views Left  Result Date: 11/05/2019 CLINICAL DATA:  Midthigh and calf pain swelling. : LEFT KNEE - COMPLETE 4+ VIEW COMPARISON:  No prior. FINDINGS: No acute soft tissue or bony abnormality. No evidence of fracture dislocation. IMPRESSION: No acute abnormality. Electronically Signed   By: 01/05/2020  Register   On: 11/05/2019 09:44   VAS 01/05/2020 LOWER EXTREMITY VENOUS (DVT) (ONLY [facility] & [facility] 7a-7p)  Result Date: 11/05/2019  Lower Venous DVTStudy Indications: Swelling, and Pain.  Comparison Study: 02/24/19 Performing Technologist: 02/26/19 RDMS, RVT  Examination Guidelines: A complete evaluation includes B-mode imaging, spectral Doppler, color Doppler, and power  Doppler as needed of all accessible portions of each vessel. Bilateral testing is considered an integral part of a complete examination. Limited examinations for reoccurring indications may be performed as noted. The reflux portion of the exam is performed with the patient in reverse Trendelenburg.  +-----+---------------+---------+-----------+----------+--------------+ RIGHTCompressibilityPhasicitySpontaneityPropertiesThrombus Aging +-----+---------------+---------+-----------+----------+--------------+ CFV                 Yes      Yes                                 +-----+---------------+---------+-----------+----------+--------------+   +---------+---------------+---------+-----------+----------+--------------+  LEFT     CompressibilityPhasicitySpontaneityPropertiesThrombus Aging +---------+---------------+---------+-----------+----------+--------------+ CFV      Full           Yes      Yes                                 +---------+---------------+---------+-----------+----------+--------------+ SFJ      Full                                                        +---------+---------------+---------+-----------+----------+--------------+ FV Prox  Full                                                        +---------+---------------+---------+-----------+----------+--------------+ FV Mid   Full                                                        +---------+---------------+---------+-----------+----------+--------------+ FV DistalFull                                                        +---------+---------------+---------+-----------+----------+--------------+ PFV      Full                                                        +---------+---------------+---------+-----------+----------+--------------+ POP      Full           Yes      Yes                                  +---------+---------------+---------+-----------+----------+--------------+ PTV      Full                                                        +---------+---------------+---------+-----------+----------+--------------+ PERO     Full                                                        +---------+---------------+---------+-----------+----------+--------------+     Summary: RIGHT: - No evidence of common femoral vein obstruction.  LEFT: - There is no evidence of deep vein thrombosis in the lower extremity.  - Fluid collection extending from popliteal fossa to proximal calf, appears to be ruptured baker's cyst.  *  See table(s) above for measurements and observations.    Preliminary     Procedures Procedures (including critical care time)  Medications Ordered in ED Medications  oxyCODONE-acetaminophen (PERCOCET/ROXICET) 5-325 MG per tablet 1 tablet (1 tablet Oral Given 11/05/19 0920)    ED Course  I have reviewed the triage vital signs and the nursing notes.  Pertinent labs & imaging results that were available during my care of the patient were reviewed by me and considered in my medical decision making (see chart for details).    MDM Rules/Calculators/A&P                      Pt presenting with left leg swelling and pain since yesterday. Tenderness is worse to popliteal region and calf, though swelling is present to knee extending down leg into the foot. No redness. NV intact. Concern for possible DVT. venous studies ordered. Also plain films to evaluate for effusion. Pain treated.  Xray is neg for effusion. Venous study neg for DVT though does show findings consistent with ruptured bakers cyst. Will treat with compression, elevation, pain medication. PCP follow up recommended. Pt aware of return precautions.   [place] Controlled Substance reporting System queried   Final Clinical Impression(s) / ED Diagnoses Final diagnoses:  Ruptured Bakers cyst    Rx / DC  Orders ED Discharge Orders         Ordered    HYDROcodone-acetaminophen (NORCO/VICODIN) 5-325 MG tablet  Every 6 hours PRN     11/05/19 1124           Lilianah Buffin, [place] N, [place] 11/05/19 1206    Arby Barrette, MD 11/13/19 1424

## 2019-11-05 NOTE — Progress Notes (Signed)
Order for venous duplex was for right leg. Changed to left leg as patient states that's the symptomatic leg.   Jeb Levering, BS, RDMS, RVT

## 2019-11-06 ENCOUNTER — Telehealth: Payer: Self-pay

## 2019-11-06 ENCOUNTER — Other Ambulatory Visit: Payer: Self-pay

## 2019-11-06 DIAGNOSIS — M660 Rupture of popliteal cyst: Secondary | ICD-10-CM

## 2019-11-06 NOTE — Telephone Encounter (Signed)
Patient called stating that he is having a lot of pain that started Monday.  Stated that he was seen at the ED on Tuesday, 6/1/202 for a ruptured baker's cyst. Patient is currently using a walker.  Would like some advise, until his appointment.  Cb# [phone].  Please advise.  Thank you.

## 2019-11-06 NOTE — Telephone Encounter (Signed)
Please fax referral to [facility].  If they can't see him quickly, I'll send referral to another.  Laser and/or ultrasound treatments can help a lot for this problem.

## 2019-11-06 NOTE — Telephone Encounter (Signed)
His appointment is not until the 7th (next Monday). Should we get him in sooner (if he is able to come in sooner)? Any advice for him?

## 2019-11-06 NOTE — Telephone Encounter (Signed)
I called the patient to let him know of the plan. He said he does have some Vicodin, which is working well for his pain. For now, he will keep the appointment on Monday with Dr. Prince Rome in place - may cancel if needed, depending on how PT is going.

## 2019-11-07 ENCOUNTER — Other Ambulatory Visit: Payer: Self-pay

## 2019-11-07 ENCOUNTER — Emergency Department (HOSPITAL_COMMUNITY): Payer: BC Managed Care – PPO

## 2019-11-07 ENCOUNTER — Encounter (HOSPITAL_COMMUNITY): Payer: Self-pay | Admitting: Student

## 2019-11-07 ENCOUNTER — Inpatient Hospital Stay (HOSPITAL_COMMUNITY)
Admission: EM | Admit: 2019-11-07 | Discharge: 2019-11-19 | DRG: 682 | Disposition: A | Discharge status: Home Health | Payer: BC Managed Care – PPO | Attending: Family Medicine | Admitting: Family Medicine

## 2019-11-07 ENCOUNTER — Emergency Department (HOSPITAL_BASED_OUTPATIENT_CLINIC_OR_DEPARTMENT_OTHER): Payer: BC Managed Care – PPO

## 2019-11-07 DIAGNOSIS — I130 Hypertensive heart and chronic kidney disease with heart failure and stage 1 through stage 4 chronic kidney disease, or unspecified chronic kidney disease: Secondary | ICD-10-CM | POA: Diagnosis present

## 2019-11-07 DIAGNOSIS — M7989 Other specified soft tissue disorders: Secondary | ICD-10-CM | POA: Diagnosis not present

## 2019-11-07 DIAGNOSIS — N049 Nephrotic syndrome with unspecified morphologic changes: Secondary | ICD-10-CM

## 2019-11-07 DIAGNOSIS — E8770 Fluid overload, unspecified: Secondary | ICD-10-CM

## 2019-11-07 DIAGNOSIS — Z8249 Family history of ischemic heart disease and other diseases of the circulatory system: Secondary | ICD-10-CM

## 2019-11-07 DIAGNOSIS — L0390 Cellulitis, unspecified: Secondary | ICD-10-CM | POA: Diagnosis present

## 2019-11-07 DIAGNOSIS — I10 Essential (primary) hypertension: Secondary | ICD-10-CM | POA: Diagnosis present

## 2019-11-07 DIAGNOSIS — N179 Acute kidney failure, unspecified: Secondary | ICD-10-CM

## 2019-11-07 DIAGNOSIS — F32A Depression, unspecified: Secondary | ICD-10-CM | POA: Diagnosis present

## 2019-11-07 DIAGNOSIS — F329 Major depressive disorder, single episode, unspecified: Secondary | ICD-10-CM | POA: Diagnosis present

## 2019-11-07 DIAGNOSIS — R0602 Shortness of breath: Secondary | ICD-10-CM

## 2019-11-07 DIAGNOSIS — R600 Localized edema: Secondary | ICD-10-CM | POA: Diagnosis not present

## 2019-11-07 DIAGNOSIS — N170 Acute kidney failure with tubular necrosis: Principal | ICD-10-CM | POA: Diagnosis present

## 2019-11-07 DIAGNOSIS — L03116 Cellulitis of left lower limb: Secondary | ICD-10-CM

## 2019-11-07 DIAGNOSIS — E785 Hyperlipidemia, unspecified: Secondary | ICD-10-CM | POA: Diagnosis present

## 2019-11-07 DIAGNOSIS — Z7952 Long term (current) use of systemic steroids: Secondary | ICD-10-CM

## 2019-11-07 DIAGNOSIS — Z20822 Contact with and (suspected) exposure to covid-19: Secondary | ICD-10-CM | POA: Diagnosis present

## 2019-11-07 DIAGNOSIS — N041 Nephrotic syndrome with focal and segmental glomerular lesions: Secondary | ICD-10-CM | POA: Diagnosis present

## 2019-11-07 DIAGNOSIS — J302 Other seasonal allergic rhinitis: Secondary | ICD-10-CM | POA: Diagnosis present

## 2019-11-07 DIAGNOSIS — Z79899 Other long term (current) drug therapy: Secondary | ICD-10-CM

## 2019-11-07 DIAGNOSIS — Z91048 Other nonmedicinal substance allergy status: Secondary | ICD-10-CM

## 2019-11-07 DIAGNOSIS — G92 Toxic encephalopathy: Secondary | ICD-10-CM | POA: Diagnosis not present

## 2019-11-07 DIAGNOSIS — N1831 Chronic kidney disease, stage 3a: Secondary | ICD-10-CM | POA: Diagnosis present

## 2019-11-07 DIAGNOSIS — J45909 Unspecified asthma, uncomplicated: Secondary | ICD-10-CM | POA: Diagnosis present

## 2019-11-07 DIAGNOSIS — I5031 Acute diastolic (congestive) heart failure: Secondary | ICD-10-CM | POA: Diagnosis present

## 2019-11-07 DIAGNOSIS — F419 Anxiety disorder, unspecified: Secondary | ICD-10-CM | POA: Diagnosis present

## 2019-11-07 DIAGNOSIS — M660 Rupture of popliteal cyst: Secondary | ICD-10-CM | POA: Diagnosis present

## 2019-11-07 DIAGNOSIS — Z6841 Body Mass Index (BMI) 40.0 and over, adult: Secondary | ICD-10-CM

## 2019-11-07 DIAGNOSIS — M109 Gout, unspecified: Secondary | ICD-10-CM | POA: Diagnosis present

## 2019-11-07 DIAGNOSIS — J9601 Acute respiratory failure with hypoxia: Secondary | ICD-10-CM | POA: Diagnosis present

## 2019-11-07 LAB — BASIC METABOLIC PANEL
Anion gap: 10 (ref 5–15)
BUN: 63 mg/dL — ABNORMAL HIGH (ref 6–20)
CO2: 25 mmol/L (ref 22–32)
Calcium: 8 mg/dL — ABNORMAL LOW (ref 8.9–10.3)
Chloride: 101 mmol/L (ref 98–111)
Creatinine, Ser: 2.09 mg/dL — ABNORMAL HIGH (ref 0.61–1.24)
GFR calc Af Amer: 40 mL/min — ABNORMAL LOW (ref >60)
GFR calc non Af Amer: 34 mL/min — ABNORMAL LOW (ref >60)
Glucose, Bld: 99 mg/dL (ref 70–99)
Potassium: 4.4 mmol/L (ref 3.5–5.1)
Sodium: 136 mmol/L (ref 135–145)

## 2019-11-07 LAB — CBC WITH DIFFERENTIAL/PLATELET
Abs Immature Granulocytes: 0.1 10*3/uL — ABNORMAL HIGH (ref 0.00–0.07)
Basophils Absolute: 0 10*3/uL (ref 0.0–0.1)
Basophils Relative: 0 %
Eosinophils Absolute: 0 10*3/uL (ref 0.0–0.5)
Eosinophils Relative: 0 %
HCT: 36.1 % — ABNORMAL LOW (ref 39.0–52.0)
Hemoglobin: 11.9 g/dL — ABNORMAL LOW (ref 13.0–17.0)
Immature Granulocytes: 1 %
Lymphocytes Relative: 9 %
Lymphs Abs: 0.9 10*3/uL (ref 0.7–4.0)
MCH: 30.7 pg (ref 26.0–34.0)
MCHC: 33 g/dL (ref 30.0–36.0)
MCV: 93 fL (ref 80.0–100.0)
Monocytes Absolute: 1.2 10*3/uL — ABNORMAL HIGH (ref 0.1–1.0)
Monocytes Relative: 13 %
Neutro Abs: 7.3 10*3/uL (ref 1.7–7.7)
Neutrophils Relative %: 77 %
Platelets: 387 10*3/uL (ref 150–400)
RBC: 3.88 MIL/uL — ABNORMAL LOW (ref 4.22–5.81)
RDW: 13.3 % (ref 11.5–15.5)
WBC: 9.5 10*3/uL (ref 4.0–10.5)
nRBC: 0 % (ref 0.0–0.2)

## 2019-11-07 LAB — URINALYSIS, ROUTINE W REFLEX MICROSCOPIC
Bilirubin Urine: NEGATIVE
Glucose, UA: NEGATIVE mg/dL
Hgb urine dipstick: NEGATIVE
Ketones, ur: NEGATIVE mg/dL
Leukocytes,Ua: NEGATIVE
Nitrite: NEGATIVE
Protein, ur: >=300 mg/dL — AB
Specific Gravity, Urine: 1.01 (ref 1.005–1.030)
pH: 5 (ref 5.0–8.0)

## 2019-11-07 LAB — CK: Total CK: 280 U/L (ref 49–397)

## 2019-11-07 LAB — C-REACTIVE PROTEIN: CRP: 28 mg/dL — ABNORMAL HIGH (ref <1.0)

## 2019-11-07 LAB — SODIUM, URINE, RANDOM: Sodium, Ur: 53 mmol/L

## 2019-11-07 LAB — SEDIMENTATION RATE: Sed Rate: 131 mm/hr — ABNORMAL HIGH (ref 0–16)

## 2019-11-07 LAB — HIV ANTIBODY (ROUTINE TESTING W REFLEX): HIV Screen 4th Generation wRfx: NONREACTIVE

## 2019-11-07 LAB — CREATININE, URINE, RANDOM: Creatinine, Urine: 96.32 mg/dL

## 2019-11-07 LAB — SARS CORONAVIRUS 2 BY RT PCR (HOSPITAL ORDER, PERFORMED IN ~~LOC~~ HOSPITAL LAB): SARS Coronavirus 2: NEGATIVE

## 2019-11-07 MED ORDER — CEFAZOLIN SODIUM-DEXTROSE 2-4 GM/100ML-% IV SOLN
2.0000 g | Freq: Three times a day (TID) | INTRAVENOUS | Status: DC
  Administered 2019-11-07 – 2019-11-11 (×11): 2 g via INTRAVENOUS
  Filled 2019-11-07 (×12): qty 100

## 2019-11-07 MED ORDER — ACETAMINOPHEN 650 MG RE SUPP: 650.0000 mg | Freq: Four times a day (QID) | RECTAL | Status: DC | PRN

## 2019-11-07 MED ORDER — CEFAZOLIN SODIUM-DEXTROSE 1-4 GM/50ML-% IV SOLN
1.0000 g | Freq: Once | INTRAVENOUS | Status: AC
  Administered 2019-11-07: 1 g via INTRAVENOUS
  Filled 2019-11-07: qty 50

## 2019-11-07 MED ORDER — HYDROMORPHONE HCL 1 MG/ML IJ SOLN
1.0000 mg | Freq: Once | INTRAMUSCULAR | Status: AC
  Administered 2019-11-07: 1 mg via INTRAMUSCULAR
  Filled 2019-11-07: qty 1

## 2019-11-07 MED ORDER — ACETAMINOPHEN 325 MG PO TABS
650.0000 mg | ORAL_TABLET | Freq: Four times a day (QID) | ORAL | Status: DC | PRN
  Administered 2019-11-10: 650 mg via ORAL
  Filled 2019-11-07: qty 2

## 2019-11-07 MED ORDER — OXYCODONE-ACETAMINOPHEN 5-325 MG PO TABS
1.0000 | ORAL_TABLET | ORAL | Status: DC | PRN
  Administered 2019-11-07 – 2019-11-15 (×23): 1 via ORAL
  Filled 2019-11-07 (×23): qty 1

## 2019-11-07 MED ORDER — ONDANSETRON HCL 4 MG PO TABS: 4.0000 mg | ORAL_TABLET | Freq: Four times a day (QID) | ORAL | Status: DC | PRN

## 2019-11-07 MED ORDER — DOCUSATE SODIUM 100 MG PO CAPS
100.0000 mg | ORAL_CAPSULE | Freq: Two times a day (BID) | ORAL | Status: DC
  Administered 2019-11-07 – 2019-11-18 (×18): 100 mg via ORAL
  Filled 2019-11-07 (×21): qty 1

## 2019-11-07 MED ORDER — OXYCODONE HCL 5 MG PO TABS: 5.0000 mg | ORAL_TABLET | ORAL | Status: DC | PRN

## 2019-11-07 MED ORDER — ALBUTEROL SULFATE (2.5 MG/3ML) 0.083% IN NEBU: 2.5000 mg | INHALATION_SOLUTION | Freq: Four times a day (QID) | RESPIRATORY_TRACT | Status: DC | PRN

## 2019-11-07 MED ORDER — SODIUM CHLORIDE 0.9 % IV SOLN: INTRAVENOUS | Status: DC

## 2019-11-07 MED ORDER — ONDANSETRON HCL 4 MG/2ML IJ SOLN
4.0000 mg | Freq: Four times a day (QID) | INTRAMUSCULAR | Status: DC | PRN
  Administered 2019-11-09 – 2019-11-18 (×4): 4 mg via INTRAVENOUS
  Filled 2019-11-07 (×4): qty 2

## 2019-11-07 MED ORDER — ENOXAPARIN SODIUM 40 MG/0.4ML ~~LOC~~ SOLN: 40.0000 mg | SUBCUTANEOUS | Status: DC

## 2019-11-07 MED ORDER — ENOXAPARIN SODIUM 60 MG/0.6ML ~~LOC~~ SOLN
60.0000 mg | SUBCUTANEOUS | Status: DC
  Administered 2019-11-07 – 2019-11-10 (×4): 60 mg via SUBCUTANEOUS
  Filled 2019-11-07 (×5): qty 0.6

## 2019-11-07 MED ORDER — ALBUTEROL SULFATE HFA 108 (90 BASE) MCG/ACT IN AERS: 2.0000 | INHALATION_SPRAY | Freq: Four times a day (QID) | RESPIRATORY_TRACT | Status: DC | PRN

## 2019-11-07 MED ORDER — GEMFIBROZIL 600 MG PO TABS
600.0000 mg | ORAL_TABLET | Freq: Two times a day (BID) | ORAL | Status: DC
  Administered 2019-11-08 – 2019-11-12 (×9): 600 mg via ORAL
  Filled 2019-11-07 (×10): qty 1

## 2019-11-07 MED ORDER — POLYETHYLENE GLYCOL 3350 17 G PO PACK: 17.0000 g | PACK | Freq: Every day | ORAL | Status: DC | PRN

## 2019-11-07 MED ORDER — LORATADINE 10 MG PO TABS
10.0000 mg | ORAL_TABLET | Freq: Every day | ORAL | Status: DC
  Administered 2019-11-08 – 2019-11-19 (×12): 10 mg via ORAL
  Filled 2019-11-07 (×12): qty 1

## 2019-11-07 MED ORDER — SODIUM CHLORIDE 0.9 % IV SOLN: Freq: Once | INTRAVENOUS | Status: AC

## 2019-11-07 MED ORDER — ESCITALOPRAM OXALATE 20 MG PO TABS
20.0000 mg | ORAL_TABLET | Freq: Every day | ORAL | Status: DC
  Administered 2019-11-08 – 2019-11-19 (×12): 20 mg via ORAL
  Filled 2019-11-07 (×12): qty 1

## 2019-11-07 MED ORDER — HYDROMORPHONE HCL 1 MG/ML IJ SOLN
0.5000 mg | INTRAMUSCULAR | Status: DC | PRN
  Administered 2019-11-07 – 2019-11-10 (×14): 1 mg via INTRAVENOUS
  Filled 2019-11-07 (×14): qty 1

## 2019-11-07 MED ORDER — HYDROMORPHONE HCL 1 MG/ML IJ SOLN
1.0000 mg | Freq: Once | INTRAMUSCULAR | Status: AC
  Administered 2019-11-07: 1 mg via INTRAVENOUS
  Filled 2019-11-07: qty 1

## 2019-11-07 MED ORDER — TACROLIMUS 1 MG PO CAPS
5.0000 mg | ORAL_CAPSULE | Freq: Two times a day (BID) | ORAL | Status: DC
  Administered 2019-11-07 – 2019-11-09 (×5): 5 mg via ORAL
  Filled 2019-11-07 (×6): qty 5

## 2019-11-07 NOTE — ED Notes (Signed)
Admitting provider paged regarding pts continued desire to be admitted after receiving pain medication.

## 2019-11-07 NOTE — ED Provider Notes (Signed)
[facility]-EMERGENCY DEPT Provider Note   CSN: [id number] Arrival date & time: 11/07/19  0707     History Chief Complaint  Patient presents with   Leg Swelling   Foot Pain    [name] is a 59 y.o. male with a history of hypertension, hyperlipidemia, asthma, anxiety, & nephrotic syndrome who returns to the ED with complaints of worsening pain/swelling to the left lower extremity since onset 05/31.  Patient was seen in the emergency department 11/05/2019 for evaluation of similar symptoms, had left knee x-ray that was negative and LLE venous duplex that was negative for DVT, however did reveal ruptured baker's cyst. He was discharged home with norco which he has been taking with mild relief. Was told to compress & elevate, has not been compressing the area but has been elevating and applying ice. He states since last visit pain seems worse in the foot and behind the knee and that the swelling may be a bit worse. He feels he cannot bear weight. Denies fever, chills, numbness, weakness, chest pain, dyspnea, recent surgery/trauma, recent long travel, hormone use, personal hx of cancer, or hx of DVT/PE.      HPI     Past Medical History:  Diagnosis Date   Anxiety    Asthma    Depression    Hypertension    Nephrotic syndrome     Patient Active Problem List   Diagnosis Date Noted   Seasonal allergies 06/26/2018   Hyperlipidemia 06/26/2018   Essential hypertension 06/26/2018   Depression 11/09/2014    Past Surgical History:  Procedure Laterality Date   RENAL BIOPSY         Family History  Problem Relation Age of Onset   Heart failure Mother    Heart failure Father     Social History   Tobacco Use   Smoking status: Never Smoker   Smokeless tobacco: Never Used  Substance Use Topics   Alcohol use: Yes    Alcohol/week: 3.0 standard drinks    Types: 1 Glasses of wine, 1 Cans of beer, 1 Shots of liquor per week    Comment:  occasionally   Drug use: No    Home Medications Prior to Admission medications   Medication Sig Start Date End Date Taking? Authorizing Provider  acetaminophen (TYLENOL) 500 MG tablet Take 500-1,000 mg by mouth every 6 (six) hours as needed for mild pain or moderate pain.    [provider]  albuterol (VENTOLIN HFA) 108 (90 Base) MCG/ACT inhaler Inhale 2 puffs into the lungs every 6 (six) hours as needed for wheezing or shortness of breath. 12/25/18   Hilts, Casimiro Needle, MD  cetirizine (ZYRTEC) 10 MG tablet Take 10 mg by mouth daily.    [provider]  Cholecalciferol (DIALYVITE VITAMIN D 5000 PO) Take 1 capsule by mouth daily.    [provider]  escitalopram (LEXAPRO) 20 MG tablet Start 1/2 tab PO qd, may increase to 1 PO qd after 1 week Patient taking differently: Take 20 mg by mouth daily.  12/25/18   Hilts, Casimiro Needle, MD  gemfibrozil (LOPID) 600 MG tablet TAKE 1 TABLET (600 MG TOTAL) BY MOUTH 2 (TWO) TIMES DAILY BEFORE A MEAL. Patient taking differently: Take 600 mg by mouth 2 (two) times daily with a meal.  08/30/19   Hilts, Casimiro Needle, MD  HYDROcodone-acetaminophen (NORCO/VICODIN) 5-325 MG tablet Take 1-2 tablets by mouth every 6 (six) hours as needed for severe pain. 11/05/19   Robinson, [place] N, PA-C  lisinopril (ZESTRIL) 10 MG tablet TAKE 1 TABLET BY MOUTH EVERY DAY Patient taking differently: Take 10 mg by mouth daily.  06/13/19   Hilts, Casimiro Needle, MD  methocarbamol (ROBAXIN) 750 MG tablet Take 750 mg by mouth every 6 (six) hours as needed for muscle spasms (pain).     [provider]  metolazone (ZAROXOLYN) 5 MG tablet Take 5 mg by mouth daily. 09/22/19   [provider]  Multiple Vitamin (MULTIVITAMIN) tablet Take 1 tablet by mouth daily.    [provider]  potassium chloride (KLOR-CON) 10 MEQ tablet Take 10 mEq by mouth in the morning, at noon, in the evening, and at bedtime.    [provider]  potassium chloride SA (KLOR-CON) 20  MEQ tablet Take 20 meq twice a day Patient not taking: Reported on 11/05/2019 06/10/19   Hilts, Michael, MD  predniSONE (DELTASONE) 20 MG tablet Take 10 mg by mouth See admin instructions. Take 1/2 tab twice a week. 08/20/19   [provider]  tacrolimus (PROGRAF) 5 MG capsule Take 5 mg by mouth 2 (two) times daily.    [provider]  torsemide (DEMADEX) 100 MG tablet Take 100 mg by mouth daily.    [provider]    Allergies    Patient has no known allergies.  Review of Systems   Review of Systems  Constitutional: Negative for chills and fever.  Respiratory: Negative for shortness of breath.   Cardiovascular: Positive for leg swelling. Negative for chest pain.  Gastrointestinal: Negative for abdominal pain and vomiting.  Musculoskeletal: Positive for arthralgias and myalgias.  Neurological: Negative for weakness and numbness.  All other systems reviewed and are negative.   Physical Exam Updated Vital Signs BP (!) 145/91 (BP Location: Left Arm)    Pulse 100    Temp 97.8 F (36.6 C) (Oral)    Resp 20    SpO2 99%   Physical Exam Vitals and nursing note reviewed.  Constitutional:      General: He is not in acute distress.    Appearance: He is well-developed. He is not ill-appearing or toxic-appearing.  HENT:     Head: Normocephalic and atraumatic.  Eyes:     General:        Right eye: No discharge.        Left eye: No discharge.     Conjunctiva/sclera: Conjunctivae normal.  Cardiovascular:     Rate and Rhythm: Normal rate and regular rhythm.     Pulses:          Dorsalis pedis pulses are 2+ on the right side and 2+ on the left side.       Posterior tibial pulses are 2+ on the right side and 2+ on the left side.  Pulmonary:     Effort: Pulmonary effort is normal. No respiratory distress.     Breath sounds: Normal breath sounds. No wheezing, rhonchi or rales.  Abdominal:     General: There is no distension.     Palpations: Abdomen is soft.      Tenderness: There is no abdominal tenderness.  Musculoskeletal:     Cervical back: Neck supple.     Comments: Lower extremities patient has 2+ symmetric pitting edema to the left lower leg and foot with some mild erythema to the anterior lower leg.  He has intact active range of motion throughout with the exception of limitation at the knee with flexion.  He is tender to palpation most focally to the popliteal fossa,  somewhat to the proximal calf, as well as to the mid and forefoot of the left lower extremity.  He is neurovascularly intact distally. Compartments are soft.   Skin:    General: Skin is warm and dry.     Capillary Refill: Capillary refill takes less than 2 seconds.     Findings: No rash.  Neurological:     Mental Status: He is alert.     Comments: Alert. Clear speech. Sensation grossly intact to bilateral lower extremities. 5/5 strength with plantar/dorsiflexion bilaterally-patient has pain with attempts to perform these motions in the left lower extremity.  Psychiatric:        Mood and Affect: Mood normal.        Behavior: Behavior normal.    ED Results / Procedures / Treatments   Labs (all labs ordered are listed, but only abnormal results are displayed) Labs Reviewed - No data to display  EKG None  Radiology DG Tibia/Fibula Left  Result Date: 11/07/2019 CLINICAL DATA:  Leg swelling EXAM: LEFT TIBIA AND FIBULA - 2 VIEW COMPARISON:  None. FINDINGS: There is no evidence of fracture or other focal bone lesions. Diffuse soft tissue edema about the included left lower extremity. IMPRESSION: No fracture or dislocation of the left tibia or fibula. Diffuse soft tissue edema. Electronically Signed   By: Eddie Candle M.D.   On: 11/07/2019 09:32   CT TIBIA FIBULA LEFT WO CONTRAST  Result Date: 11/07/2019 CLINICAL DATA:  Left lower leg and foot pain and swelling since 11/05/2019. No known injury. EXAM: CT OF THE LOWER LEFT EXTREMITY WITHOUT CONTRAST TECHNIQUE: Multidetector CT imaging  of the lower left extremity was performed according to the standard protocol. COMPARISON:  Plain films the left tibia and fibula today. FINDINGS: Bones/Joint/Cartilage No bony destructive change or periosteal reaction. No focal bony lesion. No fracture or dislocation. Imaged joints appear normal. Ligaments Suboptimally assessed by CT. Muscles and Tendons No intramuscular fluid collection. No gas within muscle or tracking along fascial planes. There is some fatty atrophy of lower leg musculature which is most notable in the medial gastrocnemius and likely related to disuse. Soft tissues Diffuse subcutaneous edema is present. No focal fluid collection, soft tissue gas or foreign body. IMPRESSION: Diffuse lower leg subcutaneous edema compatible with dependent change or cellulitis. The exam is otherwise negative. Electronically Signed   By: Inge Rise M.D.   On: 11/07/2019 12:22   CT FOOT LEFT WO CONTRAST  Result Date: 11/07/2019 CLINICAL DATA:  Left lower leg and foot pain and swelling since 11/05/2019. No known injury. EXAM: CT OF THE LEFT FOOT WITHOUT CONTRAST TECHNIQUE: Multidetector CT imaging of the left foot was performed according to the standard protocol. Multiplanar CT image reconstructions were also generated. COMPARISON:  None. FINDINGS: Bones/Joint/Cartilage No bony destructive change or periosteal reaction. No focal bony lesion. No fracture or dislocation. Imaged joints appear normal. Ligaments Suboptimally assessed by CT. Muscles and Tendons Appear normal. Soft tissues Subcutaneous edema is present about the dorsum of the foot. No focal fluid collection, radiopaque foreign body or soft tissue gas. IMPRESSION: Subcutaneous edema consistent with dependent change or cellulitis. The exam is otherwise negative. Electronically Signed   By: Inge Rise M.D.   On: 11/07/2019 12:24   VAS [place] LOWER EXTREMITY VENOUS (DVT) (MC and [facility] 7a-7p)  Result Date: 11/07/2019  Lower Venous DVTStudy Other  Indications: Diagnosed with ruptured baker's cyst 2 days ago, now with  worsening pain/ swelling/ cannot put weight on leg. Limitations: Body habitus. Comparison Study: 11/05/19 Performing Technologist: Jeb Levering RDMS, RVT  Examination Guidelines: A complete evaluation includes B-mode imaging, spectral Doppler, color Doppler, and power Doppler as needed of all accessible portions of each vessel. Bilateral testing is considered an integral part of a complete examination. Limited examinations for reoccurring indications may be performed as noted. The reflux portion of the exam is performed with the patient in reverse Trendelenburg.  +---------+---------------+---------+-----------+----------+--------------+  LEFT      Compressibility Phasicity Spontaneity Properties Thrombus Aging  +---------+---------------+---------+-----------+----------+--------------+  CFV       Full            Yes       Yes                                    +---------+---------------+---------+-----------+----------+--------------+  SFJ       Full                                                             +---------+---------------+---------+-----------+----------+--------------+  FV Prox   Full                                                             +---------+---------------+---------+-----------+----------+--------------+  FV Mid    Full                                                             +---------+---------------+---------+-----------+----------+--------------+  FV Distal Full                                                             +---------+---------------+---------+-----------+----------+--------------+  PFV       Full                                                             +---------+---------------+---------+-----------+----------+--------------+  POP       Full            Yes       Yes                                    +---------+---------------+---------+-----------+----------+--------------+   PTV       Full                                                             +---------+---------------+---------+-----------+----------+--------------+  PERO      Full                                                             +---------+---------------+---------+-----------+----------+--------------+     Summary: LEFT: - There is no evidence of deep vein thrombosis in the lower extremity.  Ruptured baker's cyst. Appears mostly unchanged from previous study.  *See table(s) above for measurements and observations.    Preliminary     Procedures Procedures (including critical care time)  Medications Ordered in ED Medications  HYDROmorphone (DILAUDID) injection 1 mg (has no administration in time range)    ED Course  I have reviewed the triage vital signs and the nursing notes.  Pertinent labs & imaging results that were available during my care of the patient were reviewed by me and considered in my medical decision making (see chart for details).    Darren Hamilton was evaluated in Emergency Department on 11/07/2019 for the symptoms described in the history of present illness. He/she was evaluated in the context of the global COVID-19 pandemic, which necessitated consideration that the patient might be at risk for infection with the SARS-CoV-2 virus that causes COVID-19. Institutional protocols and algorithms that pertain to the evaluation of patients at risk for COVID-19 are in a state of rapid change based on information released by regulatory bodies including the CDC and federal and state organizations. These policies and algorithms were followed during the patient's care in the ED.  MDM Rules/Calculators/A&P                     Patient returns to the emergency department with complaints of worsening pain and swelling in his left lower extremity, seen in the ED 11/05/2019 with ruptured Baker's cyst, negative for DVT at that time.  Nursing notes and patient's prior visits are reviewed for  additional history.  He is elevating, applying ice, and taking norco at home. Today remains edematous with tenderness to palpation.. I ordered repeat venous duplex, no change in baker cyst, negative for DVT again today. He is NVI distally, compartments are soft, does not seem consistent with compartment syndrome. Dr. Stevie Kernykstra has evaluated patient, will further assessment with labs & tib/fib x-ray.   Labs have been interpreted & reviewed:  CBC: Anemia progression far.  No leukocytosis. BMP: Acute kidney injury with creatinine 2.09 BUN 63 compared to prior 1.25 and 20 respectively. CK: Within normal limits Creatinine: Significantly elevated at 28.  Initially was thinking that erythema of the left lower extremity was related to his ruptured Baker's cyst, however with significantly elevated CRP will cover for infection with IV antibiotics.  We will start gentle fluids for his acute kidney injury.  Plan for CT of left lower extremity without contrast for further assessment.  Patient will likely require admission at this time for acute kidney injury as well as his elevated CRP.  11:11: RE-EVAL: Patient reports some pain relief, currently a 5-6 out of 10 in severity, would like another dose of pain medication which will be ordered.  He states that his renal function is typically within normal limits at least when it was checked most recently.  He gets labs done once per month by his nephrologist Dr. Allena KatzPatel.  He was started on tacrolimus 1 week ago no  other recent major medication changes.  CT:  Subcutaneous edema consistent with dependent change or cellulitis. The exam is otherwise negative--> Abx have been started.   I discussed results and tx plan with patient who is ultimately in agreement.  We have discussed with hospitalist Dr. Natale Milch who accepts admission.   This is a shared visit with supervising physician Dr. Stevie Kern who has independently evaluated patient & provided guidance in  evaluation/management/disposition, in agreement with care   Final Clinical Impression(s) / ED Diagnoses Final diagnoses:  Cellulitis of left lower extremity  AKI (acute kidney injury) [facility])    Rx / DC Orders ED Discharge Orders    None       Cherly Anderson, PA-C 11/07/19 1429    Milagros Loll, MD 11/08/19 [phone]

## 2019-11-07 NOTE — ED Triage Notes (Signed)
Pt BIBA from home.  Per EMS- Pt seen here 2 days ago for same. Pt dx with cyst in back of leg. Pt c/o ongoing leg pain, worsening. Pt c/o difficulty with ambulation.   AOx4

## 2019-11-07 NOTE — Progress Notes (Signed)
Orthopedic Tech Progress Note Patient Details:  Darren [facility] 1961/02/08 [id number]  Ortho Devices Type of Ortho Device: Ace wrap, Crutches Ortho Device/Splint Interventions: Application, Adjustment   Post Interventions Patient Tolerated: Well, Ambulated well Instructions Provided: Care of device   Saul Fordyce 11/07/2019, 8:59 AM

## 2019-11-07 NOTE — ED Notes (Signed)
Ortho tech notified to apply ace wrap and crutches.

## 2019-11-07 NOTE — ED Notes (Signed)
Patient transported to X-ray 

## 2019-11-07 NOTE — ED Notes (Signed)
Per ortho tech- pt declined crutches, reports he has a walker and crutches at home.

## 2019-11-07 NOTE — H&P (Signed)
History and Physical   Alejo Beamer ATF:[id number] DOB: 05/30/1961 DOA: 11/07/2019  PCP: Eunice Blase, MD   Chief Complaint: Leg Pain  HPI: Darren Hamilton is a 59 y.o. male with medical history significant of hypertension, nephrotic syndrome, asthma, anxiety, depression was recently evaluated for a ruptured Baker's cyst in his left lower extremity posteriorly.  Unfortunately over the past 72 hours patient's pain and swelling and ambulation have all drastically worsened despite treatment at home with elevation Vicodin.  Patient is being evaluated by PCP, planned for enrollment in physical therapy in the outpatient setting in the next few days.  Currently denies any chest pain, shortness of breath, nausea, vomiting, diarrhea, constipation, headache, fevers, chills.  He indicates pain is worse with weightbearing on the left heel as well as a range of motion at the left ankle.  Improved with elevation, ice and analgesics.  He indicates poor p.o. intake over the past 72 hours as well due to pain as well as poor ambulatory dysfunction, he states he does not want to get up to use the restroom as such she has decreased his fluid intake drastically.  ED Course: In the ED patient noted to have elevated ESR and CRP concerning for infectious process.  No leukocytosis noted.  Patient also has minimally elevated creatinine the setting of poor p.o. intake as above.  In the ED patient received Dilaudid and cefazolin as well as 1 L normal saline.  Imaging of the left lower extremity via CT does show subcutaneous edema concerning for cellulitis.  Review of Systems: As per HPI otherwise denies headache fevers chills shortness of breath or chest pain.   Assessment/Plan Principal Problem:   Cellulitis of left lower extremity without foot Active Problems:   Depression   Seasonal allergies   Hyperlipidemia   Essential hypertension   Nephrotic syndrome   Acute cellulitis of left lower extremity, POA  -  Continue cefazolin, given elevated ESR and CRP - Recent Baker's cyst rupture, uncommon but not impossibly infectious in nature - Pain controlled with Percocet, breakthrough pain continue Dilaudid -CT remarkable for edema, questionably cellulitis versus post Baker's cyst rupture inflammation  Ambulatory dysfunction in the setting of tractable pain, as above, recent Baker's cyst rupture  -PT OT to evaluate given worsening ambulatory dysfunction over the past 48 hours -Unable to ambulate on his own, previously on crutches and a walker but now unable to manage without moderate assistance at home  Mild AKI without history of CKD, concurrent nephrotic syndrome -Continue IV fluids, follow morning labs  Hypertension, essential -Continue home meds  Anxiety/depression -Continue home meds  DVT prophylaxis: Lovenox Code Status: Full Family Communication: Wife Status is: Observation  Dispo: The patient is from: Home              Anticipated d/c is to: Likely home pending improvement              Anticipated d/c date is: Likely 24 to 48 hours pending clinical course              Patient currently not medically stable for discharge given ongoing need for IV antibiotics, IV narcotics, PT OT evaluation, ambulatory dysfunction, remains unsafe discharge home  Consultants:   PT OT  Procedures:   None planned   Past Medical History:  Diagnosis Date  . Anxiety   . Asthma   . Depression   . Hypertension   . Nephrotic syndrome     Past Surgical History:  Procedure Laterality Date  .  RENAL BIOPSY       reports that he has never smoked. He has never used smokeless tobacco. He reports current alcohol use of about 3.0 standard drinks of alcohol per week. He reports that he does not use drugs.  No Known Allergies  Family History  Problem Relation Age of Onset  . Heart failure Mother   . Heart failure Father     Prior to Admission medications   Medication Sig Start Date End Date  Taking? Authorizing Provider  acetaminophen (TYLENOL) 500 MG tablet Take 500-1,000 mg by mouth every 6 (six) hours as needed for mild pain or moderate pain.   Yes [provider]  albuterol (VENTOLIN HFA) 108 (90 Base) MCG/ACT inhaler Inhale 2 puffs into the lungs every 6 (six) hours as needed for wheezing or shortness of breath. 12/25/18  Yes Hilts, Legrand Como, MD  cetirizine (ZYRTEC) 10 MG tablet Take 10 mg by mouth daily.   Yes [provider]  Cholecalciferol (DIALYVITE VITAMIN D 5000 PO) Take 5,000 Units by mouth daily.    Yes [provider]  escitalopram (LEXAPRO) 20 MG tablet Start 1/2 tab PO qd, may increase to 1 PO qd after 1 week Patient taking differently: Take 20 mg by mouth daily.  12/25/18  Yes Hilts, Legrand Como, MD  gemfibrozil (LOPID) 600 MG tablet TAKE 1 TABLET (600 MG TOTAL) BY MOUTH 2 (TWO) TIMES DAILY BEFORE A MEAL. Patient taking differently: Take 600 mg by mouth 2 (two) times daily with a meal.  08/30/19  Yes Hilts, Legrand Como, MD  HYDROcodone-acetaminophen (NORCO/VICODIN) 5-325 MG tablet Take 1-2 tablets by mouth every 6 (six) hours as needed for severe pain. 11/05/19  Yes Quentin Cornwall, [place] N, PA-C  lisinopril (ZESTRIL) 10 MG tablet TAKE 1 TABLET BY MOUTH EVERY DAY Patient taking differently: Take 10 mg by mouth daily.  06/13/19  Yes Hilts, Legrand Como, MD  methocarbamol (ROBAXIN) 750 MG tablet Take 750 mg by mouth every 6 (six) hours as needed for muscle spasms (pain).    Yes [provider]  metolazone (ZAROXOLYN) 5 MG tablet Take 5 mg by mouth daily. 09/22/19  Yes [provider]  Multiple Vitamin (MULTIVITAMIN) tablet Take 1 tablet by mouth daily.   Yes [provider]  potassium chloride SA (KLOR-CON) 20 MEQ tablet Take 20 meq twice a day Patient taking differently: Take 20 mEq by mouth in the morning, at noon, in the evening, and at bedtime.  06/10/19  Yes Hilts, Legrand Como, MD  predniSONE (DELTASONE) 20 MG tablet Take 10 mg by mouth See admin  instructions. Take 1/2 tab twice a week. 08/20/19  Yes [provider]  tacrolimus (PROGRAF) 5 MG capsule Take 5 mg by mouth 2 (two) times daily.   Yes [provider]  torsemide (DEMADEX) 100 MG tablet Take 100 mg by mouth daily.   Yes [provider]    Physical Exam: Vitals:   11/07/19 0724 11/07/19 1027 11/07/19 1245 11/07/19 1500  BP:  (!) 145/84 (!) 161/96 (!) 136/99  Pulse:  100 100 94  Resp:  _0 Temp:      TempSrc:      SpO2: 99% 93% 94% 94%    Constitutional: NAD, calm, comfortable Vitals:   11/07/19 0724 11/07/19 1027 11/07/19 1245 11/07/19 1500  BP:  (!) 145/84 (!) 161/96 (!) 136/99  Pulse:  100 100 94  Resp:  _1 Temp:      TempSrc:      SpO2: 99% 93%  94% 94%   General:  Pleasantly resting in bed, No acute distress. HEENT:  Normocephalic atraumatic.  Sclerae nonicteric, noninjected.  Extraocular movements intact bilaterally. Neck:  Without mass or deformity.  Trachea is midline. Lungs:  Clear to auscultate bilaterally without rhonchi, wheeze, or rales. Heart:  Regular rate and rhythm.  Without murmurs, rubs, or gallops. Abdomen:  Soft, nontender, nondistended.  Without guarding or rebound. Extremities: Without cyanosis, clubbing, 1+ pitting edema left lower extremity; moderately tender to palpate distal to the knee proximal to the midfoot Vascular:  Dorsalis pedis and posterior tibial pulses palpable bilaterally. Skin:  Warm and dry, no erythema, no ulcerations.  Labs on Admission: I have personally reviewed following labs and imaging studies  CBC: Recent Labs  Lab 11/07/19 0930  WBC 9.5  NEUTROABS 7.3  HGB 11.9*  HCT 36.1*  MCV 93.0  PLT 387   Basic Metabolic Panel: Recent Labs  Lab 11/07/19 0930  NA 136  K 4.4  CL 101  CO2 25  GLUCOSE 99  BUN 63*  CREATININE 2.09*  CALCIUM 8.0*   GFR: Estimated Creatinine Clearance: 52.1 mL/min (A) (by C-G formula based on SCr of 2.09 mg/dL (H)). Liver Function  Tests: No results for input(s): AST, ALT, ALKPHOS, BILITOT, PROT, ALBUMIN in the last 168 hours. No results for input(s): LIPASE, AMYLASE in the last 168 hours. No results for input(s): AMMONIA in the last 168 hours. Coagulation Profile: No results for input(s): INR, PROTIME in the last 168 hours. Cardiac Enzymes: Recent Labs  Lab 11/07/19 0930  CKTOTAL 280   BNP (last 3 results) No results for input(s): PROBNP in the last 8760 hours. HbA1C: No results for input(s): HGBA1C in the last 72 hours. CBG: No results for input(s): GLUCAP in the last 168 hours. Lipid Profile: No results for input(s): CHOL, HDL, LDLCALC, TRIG, CHOLHDL, LDLDIRECT in the last 72 hours. Thyroid Function Tests: No results for input(s): TSH, T4TOTAL, FREET4, T3FREE, THYROIDAB in the last 72 hours. Anemia Panel: No results for input(s): VITAMINB12, FOLATE, FERRITIN, TIBC, IRON, RETICCTPCT in the last 72 hours. Urine analysis:    Component Value Date/Time   COLORURINE YELLOW 11/07/2019 1130   APPEARANCEUR CLEAR 11/07/2019 1130   APPEARANCEUR Cloudy (A) 04/01/2019 1246   LABSPEC 1.010 11/07/2019 1130   PHURINE 5.0 11/07/2019 1130   GLUCOSEU NEGATIVE 11/07/2019 1130   HGBUR NEGATIVE 11/07/2019 1130   BILIRUBINUR NEGATIVE 11/07/2019 1130   BILIRUBINUR Negative 04/01/2019 1246   KETONESUR NEGATIVE 11/07/2019 1130   PROTEINUR >=300 (A) 11/07/2019 1130   NITRITE NEGATIVE 11/07/2019 1130   LEUKOCYTESUR NEGATIVE 11/07/2019 1130    Radiological Exams on Admission: DG Tibia/Fibula Left  Result Date: 11/07/2019 CLINICAL DATA:  Leg swelling EXAM: LEFT TIBIA AND FIBULA - 2 VIEW COMPARISON:  None. FINDINGS: There is no evidence of fracture or other focal bone lesions. Diffuse soft tissue edema about the included left lower extremity. IMPRESSION: No fracture or dislocation of the left tibia or fibula. Diffuse soft tissue edema. Electronically Signed   By: Alex  Bibbey M.D.   On: 11/07/2019 09:32   CT TIBIA FIBULA LEFT  WO CONTRAST  Result Date: 11/07/2019 CLINICAL DATA:  Left lower leg and foot pain and swelling since 11/05/2019. No known injury. EXAM: CT OF THE LOWER LEFT EXTREMITY WITHOUT CONTRAST TECHNIQUE: Multidetector CT imaging of the lower left extremity was performed according to the standard protocol. COMPARISON:  Plain films the left tibia and fibula today. FINDINGS: Bones/Joint/Cartilage No bony destructive change or periosteal reaction. No focal   bony lesion. No fracture or dislocation. Imaged joints appear normal. Ligaments Suboptimally assessed by CT. Muscles and Tendons No intramuscular fluid collection. No gas within muscle or tracking along fascial planes. There is some fatty atrophy of lower leg musculature which is most notable in the medial gastrocnemius and likely related to disuse. Soft tissues Diffuse subcutaneous edema is present. No focal fluid collection, soft tissue gas or foreign body. IMPRESSION: Diffuse lower leg subcutaneous edema compatible with dependent change or cellulitis. The exam is otherwise negative. Electronically Signed   By: Thomas  Dalessio M.D.   On: 11/07/2019 12:22   CT FOOT LEFT WO CONTRAST  Result Date: 11/07/2019 CLINICAL DATA:  Left lower leg and foot pain and swelling since 11/05/2019. No known injury. EXAM: CT OF THE LEFT FOOT WITHOUT CONTRAST TECHNIQUE: Multidetector CT imaging of the left foot was performed according to the standard protocol. Multiplanar CT image reconstructions were also generated. COMPARISON:  None. FINDINGS: Bones/Joint/Cartilage No bony destructive change or periosteal reaction. No focal bony lesion. No fracture or dislocation. Imaged joints appear normal. Ligaments Suboptimally assessed by CT. Muscles and Tendons Appear normal. Soft tissues Subcutaneous edema is present about the dorsum of the foot. No focal fluid collection, radiopaque foreign body or soft tissue gas. IMPRESSION: Subcutaneous edema consistent with dependent change or cellulitis. The  exam is otherwise negative. Electronically Signed   By: Thomas  Dalessio M.D.   On: 11/07/2019 12:24   VAS US LOWER EXTREMITY VENOUS (DVT) (MC and WL 7a-7p)  Result Date: 11/07/2019  Lower Venous DVTStudy Other Indications: Diagnosed with ruptured baker's cyst 2 days ago, now with                    worsening pain/ swelling/ cannot put weight on leg. Limitations: Body habitus. Comparison Study: 11/05/19 Performing Technologist: Jill Parker RDMS, RVT  Examination Guidelines: A complete evaluation includes B-mode imaging, spectral Doppler, color Doppler, and power Doppler as needed of all accessible portions of each vessel. Bilateral testing is considered an integral part of a complete examination. Limited examinations for reoccurring indications may be performed as noted. The reflux portion of the exam is performed with the patient in reverse Trendelenburg.  +---------+---------------+---------+-----------+----------+--------------+ LEFT     CompressibilityPhasicitySpontaneityPropertiesThrombus Aging +---------+---------------+---------+-----------+----------+--------------+ CFV      Full           Yes      Yes                                 +---------+---------------+---------+-----------+----------+--------------+ SFJ      Full                                                        +---------+---------------+---------+-----------+----------+--------------+ FV Prox  Full                                                        +---------+---------------+---------+-----------+----------+--------------+ FV Mid   Full                                                        +---------+---------------+---------+-----------+----------+--------------+   FV DistalFull                                                        +---------+---------------+---------+-----------+----------+--------------+ PFV      Full                                                         +---------+---------------+---------+-----------+----------+--------------+ POP      Full           Yes      Yes                                 +---------+---------------+---------+-----------+----------+--------------+ PTV      Full                                                        +---------+---------------+---------+-----------+----------+--------------+ PERO     Full                                                        +---------+---------------+---------+-----------+----------+--------------+     Summary: LEFT: - There is no evidence of deep vein thrombosis in the lower extremity.  Ruptured baker's cyst. Appears mostly unchanged from previous study.  *See table(s) above for measurements and observations.    Preliminary    William C Lancaster DO [facility]  If 7PM-7AM, please contact night-coverage [email or web address]   11/07/2019, 3:14 PM    

## 2019-11-07 NOTE — Progress Notes (Signed)
Orthopedic Tech Progress Note Patient Details:  Darren Hamilton 03/06/1961 [id number]  Patient ID: [name], male   DOB: Jun 22, 1960, 59 y.o.   MRN: [id number]   Darden Dates KoontzPatients wife has crutches and a walker at home, Patient returned crutches 11/07/2019, 9:19 AM

## 2019-11-08 ENCOUNTER — Observation Stay (HOSPITAL_COMMUNITY): Payer: BC Managed Care – PPO

## 2019-11-08 DIAGNOSIS — N179 Acute kidney failure, unspecified: Secondary | ICD-10-CM | POA: Diagnosis not present

## 2019-11-08 DIAGNOSIS — L0390 Cellulitis, unspecified: Secondary | ICD-10-CM | POA: Diagnosis present

## 2019-11-08 DIAGNOSIS — Z91048 Other nonmedicinal substance allergy status: Secondary | ICD-10-CM | POA: Diagnosis not present

## 2019-11-08 DIAGNOSIS — J9601 Acute respiratory failure with hypoxia: Secondary | ICD-10-CM | POA: Diagnosis present

## 2019-11-08 DIAGNOSIS — N041 Nephrotic syndrome with focal and segmental glomerular lesions: Secondary | ICD-10-CM | POA: Diagnosis present

## 2019-11-08 DIAGNOSIS — E785 Hyperlipidemia, unspecified: Secondary | ICD-10-CM | POA: Diagnosis present

## 2019-11-08 DIAGNOSIS — G92 Toxic encephalopathy: Secondary | ICD-10-CM | POA: Diagnosis not present

## 2019-11-08 DIAGNOSIS — Z20822 Contact with and (suspected) exposure to covid-19: Secondary | ICD-10-CM | POA: Diagnosis present

## 2019-11-08 DIAGNOSIS — F419 Anxiety disorder, unspecified: Secondary | ICD-10-CM | POA: Diagnosis present

## 2019-11-08 DIAGNOSIS — N170 Acute kidney failure with tubular necrosis: Secondary | ICD-10-CM | POA: Diagnosis present

## 2019-11-08 DIAGNOSIS — J45909 Unspecified asthma, uncomplicated: Secondary | ICD-10-CM | POA: Diagnosis present

## 2019-11-08 DIAGNOSIS — I10 Essential (primary) hypertension: Secondary | ICD-10-CM | POA: Diagnosis not present

## 2019-11-08 DIAGNOSIS — I5031 Acute diastolic (congestive) heart failure: Secondary | ICD-10-CM | POA: Diagnosis present

## 2019-11-08 DIAGNOSIS — F329 Major depressive disorder, single episode, unspecified: Secondary | ICD-10-CM | POA: Diagnosis present

## 2019-11-08 DIAGNOSIS — N1831 Chronic kidney disease, stage 3a: Secondary | ICD-10-CM | POA: Diagnosis present

## 2019-11-08 DIAGNOSIS — I130 Hypertensive heart and chronic kidney disease with heart failure and stage 1 through stage 4 chronic kidney disease, or unspecified chronic kidney disease: Secondary | ICD-10-CM | POA: Diagnosis present

## 2019-11-08 DIAGNOSIS — Z7952 Long term (current) use of systemic steroids: Secondary | ICD-10-CM | POA: Diagnosis not present

## 2019-11-08 DIAGNOSIS — Z8249 Family history of ischemic heart disease and other diseases of the circulatory system: Secondary | ICD-10-CM | POA: Diagnosis not present

## 2019-11-08 DIAGNOSIS — M79609 Pain in unspecified limb: Secondary | ICD-10-CM | POA: Diagnosis not present

## 2019-11-08 DIAGNOSIS — L03116 Cellulitis of left lower limb: Secondary | ICD-10-CM | POA: Diagnosis not present

## 2019-11-08 DIAGNOSIS — Z6841 Body Mass Index (BMI) 40.0 and over, adult: Secondary | ICD-10-CM | POA: Diagnosis not present

## 2019-11-08 DIAGNOSIS — M109 Gout, unspecified: Secondary | ICD-10-CM | POA: Diagnosis present

## 2019-11-08 DIAGNOSIS — R601 Generalized edema: Secondary | ICD-10-CM | POA: Diagnosis not present

## 2019-11-08 DIAGNOSIS — N049 Nephrotic syndrome with unspecified morphologic changes: Secondary | ICD-10-CM | POA: Diagnosis not present

## 2019-11-08 DIAGNOSIS — Z79899 Other long term (current) drug therapy: Secondary | ICD-10-CM | POA: Diagnosis not present

## 2019-11-08 DIAGNOSIS — F320 Major depressive disorder, single episode, mild: Secondary | ICD-10-CM | POA: Diagnosis not present

## 2019-11-08 DIAGNOSIS — M660 Rupture of popliteal cyst: Secondary | ICD-10-CM | POA: Diagnosis present

## 2019-11-08 LAB — COMPREHENSIVE METABOLIC PANEL
ALT: 14 U/L (ref 0–44)
AST: 13 U/L — ABNORMAL LOW (ref 15–41)
Albumin: 1.7 g/dL — ABNORMAL LOW (ref 3.5–5.0)
Alkaline Phosphatase: 49 U/L (ref 38–126)
Anion gap: 9 (ref 5–15)
BUN: 64 mg/dL — ABNORMAL HIGH (ref 6–20)
CO2: 27 mmol/L (ref 22–32)
Calcium: 7.9 mg/dL — ABNORMAL LOW (ref 8.9–10.3)
Chloride: 102 mmol/L (ref 98–111)
Creatinine, Ser: 2 mg/dL — ABNORMAL HIGH (ref 0.61–1.24)
GFR calc Af Amer: 42 mL/min — ABNORMAL LOW (ref >60)
GFR calc non Af Amer: 36 mL/min — ABNORMAL LOW (ref >60)
Glucose, Bld: 101 mg/dL — ABNORMAL HIGH (ref 70–99)
Potassium: 4.4 mmol/L (ref 3.5–5.1)
Sodium: 138 mmol/L (ref 135–145)
Total Bilirubin: 0.6 mg/dL (ref 0.3–1.2)
Total Protein: 5.9 g/dL — ABNORMAL LOW (ref 6.5–8.1)

## 2019-11-08 LAB — CBC
HCT: 34.1 % — ABNORMAL LOW (ref 39.0–52.0)
Hemoglobin: 11.2 g/dL — ABNORMAL LOW (ref 13.0–17.0)
MCH: 30.9 pg (ref 26.0–34.0)
MCHC: 32.8 g/dL (ref 30.0–36.0)
MCV: 93.9 fL (ref 80.0–100.0)
Platelets: 364 10*3/uL (ref 150–400)
RBC: 3.63 MIL/uL — ABNORMAL LOW (ref 4.22–5.81)
RDW: 13.2 % (ref 11.5–15.5)
WBC: 7.9 10*3/uL (ref 4.0–10.5)
nRBC: 0 % (ref 0.0–0.2)

## 2019-11-08 MED ORDER — HYDRALAZINE HCL 20 MG/ML IJ SOLN
10.0000 mg | Freq: Four times a day (QID) | INTRAMUSCULAR | Status: DC | PRN
  Administered 2019-11-13: 10 mg via INTRAVENOUS
  Filled 2019-11-08: qty 1

## 2019-11-08 MED ORDER — SODIUM CHLORIDE 0.9 % IV SOLN: INTRAVENOUS | Status: AC

## 2019-11-08 NOTE — Evaluation (Signed)
Occupational Therapy Evaluation Patient Details Name: Darren Hamilton MRN: [id number] DOB: 05-07-1961 Today's Date: 11/08/2019    History of Present Illness 59 y.o. male with a history of hypertension, hyperlipidemia, asthma, anxiety, & nephrotic syndrome who returns to the ED with complaints of worsening pain/swelling to the left lower extremity since onset 05/31.  Patient was seen in the emergency department 11/05/2019 for evaluation of similar symptoms, had left knee x-ray that was negative and LLE venous duplex that was negative for DVT, however did reveal ruptured baker's cyst. He was discharged home with norco which he has been taking with mild relief. Was told to compress & elevate, has not been compressing the area but has been elevating and applying ice. He states since last visit pain seems worse in the foot and behind the knee and that the swelling may be a bit worse. He feels he cannot bear weight. Denies fever, chills, numbness, weakness, chest pain, dyspnea, recent surgery/trauma, recent long travel, hormone use, personal hx of cancer, or hx of DVT/PE.   Clinical Impression   Darren Hamilton is a 59 year old man admitted to hospital with complaints of lower extremity pain. On evaluation patient demonstrates inability to transfer or stand, needed assistance in rolling and varying levels of assistance with bed level ADLs secondary to complaints of bilateral lower extremity pain and poor activity tolerance. Patient demonstrates normal/strong upper extremities that were able to assist with rolling. Patient will benefit from skilled OT services to improve deficits and return to PLOF. Recommendations made with the consideration that when pain managed patient while be able to improve functional mobility and ADLs and return home at discharge.      Follow Up Recommendations  Home health OT;Supervision/Assistance - 24 hour    Equipment Recommendations  Tub/shower seat    Recommendations for  Other Services       Precautions / Restrictions Precautions Precautions: Fall Precaution Comments: high pain/tenderness to light touch BLE Restrictions Weight Bearing Restrictions: No      Mobility Bed Mobility Overal bed mobility: Needs Assistance Bed Mobility: Rolling Rolling: Min assist         General bed mobility comments: Patient able to use bed rails and roll - though not fully secondary to LE pain.  Transfers                      Balance                                           ADL either performed or assessed with clinical judgement   ADL Overall ADL's : Needs assistance/impaired Eating/Feeding: Independent   Grooming: Set up;Bed level   Upper Body Bathing: Set up;Bed level   Lower Body Bathing: Bed level;Set up;Moderate assistance   Upper Body Dressing : Set up;Bed level   Lower Body Dressing: Bed level;Maximal assistance     Toilet Transfer Details (indicate cue type and reason): unable Toileting- Clothing Manipulation and Hygiene: Maximal assistance;Bed level Toileting - Clothing Manipulation Details (indicate cue type and reason): Max assist fo therapist to doff underwear.   Tub/Shower Transfer Details (indicate cue type and reason): n/a   General ADL Comments: Patient unable to transfer or stand at time of OT evaluation secondary to pain. ADLs limited to bed level.     Vision   Vision Assessment?: No apparent visual deficits  Perception     Praxis      Pertinent Vitals/Pain Pain Assessment: 0-10 Pain Score: 10-Worst pain ever Pain Location: Bilateral LEs with movement Pain Descriptors / Indicators: Grimacing;Guarding;Spasm;Jabbing Pain Intervention(s): Limited activity within patient's tolerance     Hand Dominance     Extremity/Trunk Assessment Upper Extremity Assessment Upper Extremity Assessment: Overall WFL for tasks assessed   Lower Extremity Assessment Lower Extremity Assessment: Defer to PT  evaluation   Cervical / Trunk Assessment Cervical / Trunk Assessment: Normal   Communication Communication Communication: No difficulties   Cognition Arousal/Alertness: Awake/alert Behavior During Therapy: WFL for tasks assessed/performed Overall Cognitive Status: Within Functional Limits for tasks assessed                                     General Comments       Exercises     Shoulder Instructions      Home Living Family/patient expects to be discharged to:: Private residence Living Arrangements: Spouse/significant other;Children Available Help at Discharge: Family   Home Access: Stairs to enter     Home Layout: Two level;Able to live on main level with bedroom/bathroom Alternate Level Stairs-Number of Steps: flight   Bathroom Shower/Tub: Tub/shower unit         Home Equipment: Gilford Rile - 2 wheels;Crutches          Prior Functioning/Environment Level of Independence: Independent        Comments: Patient reports using a walker recently due to LE weakness. 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        OT Problem List: Decreased activity tolerance;Pain;Obesity;Decreased knowledge of use of DME or AE      OT Treatment/Interventions: Self-care/ADL training;Therapeutic exercise;DME and/or AE instruction;Patient/family education;Therapeutic activities    OT Goals(Current goals can be found in the care plan section) Acute Rehab OT Goals Patient Stated Goal: to get back to work OT Goal Formulation: With patient Time For Goal Achievement: 11/22/19 Potential to Achieve Goals: Fair  OT Frequency: Min 2X/week   Barriers to D/C:            Co-evaluation              AM-PAC OT "6 Clicks" Daily Activity     Outcome Measure Help from another person eating meals?: None Help from another person taking care of personal grooming?: A Little Help from another person toileting, which includes using toliet, bedpan, or urinal?: Total Help from another  person bathing (including washing, rinsing, drying)?: A Lot Help from another person to put on and taking off regular upper body clothing?: A Little Help from another person to put on and taking off regular lower body clothing?: Total 6 Click Score: 14   End of Session Nurse Communication: Mobility status(recommended overhead trapeze.)  Activity Tolerance: Patient limited by pain Patient left: in bed;with call bell/phone within reach  OT Visit Diagnosis: Pain;Muscle weakness (generalized) (M62.81) Pain - Right/Left: (Right and Left) Pain - part of body: Leg;Ankle and joints of foot                Time: 7616-0737 OT Time Calculation (min): 19 min Charges:  OT General Charges $OT Visit: 1 Visit OT Evaluation $OT Eval Moderate Complexity: 1 Mod  Murlene Revell, OTR/L [place]  Office [phone]   Lenward Chancellor 11/08/2019, 1:39 PM

## 2019-11-08 NOTE — Progress Notes (Signed)
[facility]                                                                              Patient Demographics  Darren Hamilton, is a 59 y.o. male, DOB - [birth year], JZP:[id number]  Admit date - 11/07/2019   Admitting Physician Little Ishikawa, MD  Outpatient Primary MD for the patient is Eunice Blase, MD  Outpatient specialists:   LOS - 0  days   Medical records reviewed and are as summarized below:    Chief Complaint  Patient presents with   Leg Swelling   Foot Pain       Brief summary   Patient is a 59 year old male with history of hypertension, nephrotic syndrome (FSGS, Dr Posey Pronto, on tacrolimus), asthma, anxiety, recently evaluated for ruptured Baker's cyst in his left lower extremity posteriorly.  Over the past 72 hours prior to admission, patients pain and swelling continued to worsen, with ambulation.  He had tried treatment at home with elevation, pain medication.  Patient was evaluated by his PCP for enrollment in PT. patient for the pain is worse with weightbearing and range of motion on the left ankle.  He also reported poor p.o. intake in the last 3 days due to pain and poor ambulatory dysfunction. In ED, noted to have elevated ESR, CRP concerning for infectious process.  Doppler ultrasound left lower extremity showed ruptured Baker's cyst, no DVT.  Assessment & Plan    Principal Problem:   Cellulitis of left lower extremity, ruptured Baker's cyst, intractable pain, POA -Elevated ESR, CRP -Continue IV cefazolin, continue to elevate leg -CT showed edema questionably cellulitis versus post Baker's cyst rupture inflammation  Active Problems: Intractable pain, recent Baker's cyst rupture left lower extremity, now right lower extremity pain -Patient trying to ambulate with PT, (I was in the room), had severe pain in both lower extremities -will also obtain Doppler ultrasound of the right lower extremity.  Pending results I will discuss with  vascular surgery regarding Baker's cyst rupture -Continue pain control, PT as tolerated  Mild acute kidney injury, has history of FSGS -Continue IV fluids, likely worsened due to #1 -Continue tacrolimus, pharmacy to follow closely -Hold lisinopril, Demadex, metolazone today, will reassess renal function in a.m. -If creatinine still worsening, will consult nephrology    Hyperlipidemia -Continue gemfibrozil    Essential hypertension -BP elevated, likely due to pain  - placed on hydralazine IV as needed with parameters -Holding torsemide, metolazone, lisinopril   Obesity Estimated body mass index is 44.31 kg/m as calculated from the following:   Height as of this encounter: _0  (1.727 m).   Weight as of this encounter: 132.2 kg.  Code Status: full  DVT Prophylaxis:  Lovenox  Family Communication: Discussed all imaging results, lab results, explained to the patient    Disposition Plan:     Status is: Observation, will call [facility]  The patient will require care spanning > 2 midnights and should be moved to inpatient because: Inpatient level of care appropriate due to severity of illness.  Patient is not safe to be discharged due to difficulty ambulating (intractable pain, cellulitis left lower extremity on  IV antibiotics)  Dispo: The patient is from: Home              Anticipated d/c is to: Home              Anticipated d/c date is: 2 days              Patient currently is not medically stable to d/c.      Time Spent in minutes 35 minutes  Procedures:  Doppler ultrasound left lower extremity CT tib-fib left lower extremity  Consultants:   None  Antimicrobials:   Anti-infectives (From admission, onward)   Start     Dose/Rate Route Frequency Ordered Stop   11/07/19 1445  ceFAZolin (ANCEF) IVPB 2g/100 mL premix     2 g 200 mL/hr over 30 Minutes Intravenous Every 8 hours 11/07/19 1436     11/07/19 1100  ceFAZolin (ANCEF) IVPB 1 g/50 mL premix     1 g 100 mL/hr over 30  Minutes Intravenous  Once 11/07/19 1059 11/07/19 1315          Medications  Scheduled Meds:  docusate sodium  100 mg Oral BID   enoxaparin (LOVENOX) injection  60 mg Subcutaneous Q24H   escitalopram  20 mg Oral Daily   gemfibrozil  600 mg Oral BID WC   loratadine  10 mg Oral Daily   tacrolimus  5 mg Oral BID   Continuous Infusions:  sodium chloride      ceFAZolin (ANCEF) IV 2 g (11/08/19 0950)   PRN Meds:.acetaminophen **OR** acetaminophen, albuterol, HYDROmorphone (DILAUDID) injection, ondansetron **OR** ondansetron (ZOFRAN) IV, oxyCODONE-acetaminophen, polyethylene glycol      Subjective:   Darren Hamilton was seen and examined today.  Patient in 10 out of 10 pain, difficulty ambulating with PT.  Also complaining of right lower extremity pain on bearing weight. Patient denies dizziness, chest pain, shortness of breath, abdominal pain, N/V/D/C, new weakness, numbess, tingling. No acute events overnight.    Objective:   Vitals:   11/07/19 2330 11/07/19 2339 11/08/19 0621 11/08/19 0957  BP: (!) 192/176 (!) 144/87 120/80 (!) 141/91  Pulse: (!) 107 (!) 107 95 (!) 103  Resp: _0 Temp: 97.9 F (36.6 C)  98.3 F (36.8 C) 97.9 F (36.6 C)  TempSrc: Oral  Oral Oral  SpO2: 94% 96% 94% 94%  Weight:      Height:        Intake/Output Summary (Last 24 hours) at 11/08/2019 1040 Last data filed at 11/08/2019 1014 Gross per 24 hour  Intake 1170.89 ml  Output 300 ml  Net 870.89 ml     Wt Readings from Last 3 Encounters:  11/07/19 132.2 kg  11/05/19 130.6 kg  09/10/19 127.9 kg     Exam  General: Alert and oriented x 3, NAD, visibly uncomfortable  Cardiovascular: S1 S2 auscultated, no murmurs, RRR  Respiratory: Clear to auscultation bilaterally, no wheezing, rales or rhonchi  Gastrointestinal: Soft, nontender, nondistended, + bowel sounds  Ext: Left lower extremity dressing intact.  1+ pitting edema of both lower extremities  Neuro: No new  deficits  Musculoskeletal: No digital cyanosis, clubbing  Skin: No rashes  Psych: Normal affect and demeanor, alert and oriented x3    Data Reviewed:  I have personally reviewed following labs and imaging studies  Micro Results Recent Results (from the past 240 hour(s))  SARS Coronavirus 2 by RT PCR (hospital order, performed in [facility] hospital lab) Nasopharyngeal Nasopharyngeal Swab  Status: None   Collection Time: 11/07/19 10:55 AM   Specimen: Nasopharyngeal Swab  Result Value Ref Range Status   SARS Coronavirus 2 NEGATIVE NEGATIVE Final    Comment: (NOTE) SARS-CoV-2 target nucleic acids are NOT DETECTED. The SARS-CoV-2 RNA is generally detectable in upper and lower respiratory specimens during the acute phase of infection. The lowest concentration of SARS-CoV-2 viral copies this assay can detect is 250 copies / mL. A negative result does not preclude SARS-CoV-2 infection and should not be used as the sole basis for treatment or other patient management decisions.  A negative result may occur with improper specimen collection / handling, submission of specimen other than nasopharyngeal swab, presence of viral mutation(s) within the areas targeted by this assay, and inadequate number of viral copies (<250 copies / mL). A negative result must be combined with clinical observations, patient history, and epidemiological information. Fact Sheet for Patients:   StrictlyIdeas.no Fact Sheet for Healthcare Providers: BankingDealers.co.za This test is not yet approved or cleared  by the [place] FDA and has been authorized for detection and/or diagnosis of SARS-CoV-2 by FDA under an Emergency Use Authorization (EUA).  This EUA will remain in effect (meaning this test can be used) for the duration of the COVID-19 declaration under Section 564(b)(1) of the Act, 21 U.S.C. section 360bbb-3(b)(1), unless the authorization is  terminated or revoked sooner. Performed at [facility], [place] [address], [place], [place] [address]     Radiology Reports DG Tibia/Fibula Left  Result Date: 11/07/2019 CLINICAL DATA:  Leg swelling EXAM: LEFT TIBIA AND FIBULA - 2 VIEW COMPARISON:  None. FINDINGS: There is no evidence of fracture or other focal bone lesions. Diffuse soft tissue edema about the included left lower extremity. IMPRESSION: No fracture or dislocation of the left tibia or fibula. Diffuse soft tissue edema. Electronically Signed   By: Eddie Candle M.D.   On: 11/07/2019 09:32   CT TIBIA FIBULA LEFT WO CONTRAST  Result Date: 11/07/2019 CLINICAL DATA:  Left lower leg and foot pain and swelling since 11/05/2019. No known injury. EXAM: CT OF THE LOWER LEFT EXTREMITY WITHOUT CONTRAST TECHNIQUE: Multidetector CT imaging of the lower left extremity was performed according to the standard protocol. COMPARISON:  Plain films the left tibia and fibula today. FINDINGS: Bones/Joint/Cartilage No bony destructive change or periosteal reaction. No focal bony lesion. No fracture or dislocation. Imaged joints appear normal. Ligaments Suboptimally assessed by CT. Muscles and Tendons No intramuscular fluid collection. No gas within muscle or tracking along fascial planes. There is some fatty atrophy of lower leg musculature which is most notable in the medial gastrocnemius and likely related to disuse. Soft tissues Diffuse subcutaneous edema is present. No focal fluid collection, soft tissue gas or foreign body. IMPRESSION: Diffuse lower leg subcutaneous edema compatible with dependent change or cellulitis. The exam is otherwise negative. Electronically Signed   By: Inge Rise M.D.   On: 11/07/2019 12:22   CT FOOT LEFT WO CONTRAST  Result Date: 11/07/2019 CLINICAL DATA:  Left lower leg and foot pain and swelling since 11/05/2019. No known injury. EXAM: CT OF THE LEFT FOOT WITHOUT CONTRAST TECHNIQUE: Multidetector CT  imaging of the left foot was performed according to the standard protocol. Multiplanar CT image reconstructions were also generated. COMPARISON:  None. FINDINGS: Bones/Joint/Cartilage No bony destructive change or periosteal reaction. No focal bony lesion. No fracture or dislocation. Imaged joints appear normal. Ligaments Suboptimally assessed by CT. Muscles and Tendons Appear normal. Soft tissues Subcutaneous edema  is present about the dorsum of the foot. No focal fluid collection, radiopaque foreign body or soft tissue gas. IMPRESSION: Subcutaneous edema consistent with dependent change or cellulitis. The exam is otherwise negative. Electronically Signed   By: Inge Rise M.D.   On: 11/07/2019 12:24   DG Knee Complete 4 Views Left  Result Date: 11/05/2019 CLINICAL DATA:  Midthigh and calf pain swelling. : LEFT KNEE - COMPLETE 4+ VIEW COMPARISON:  No prior. FINDINGS: No acute soft tissue or bony abnormality. No evidence of fracture dislocation. IMPRESSION: No acute abnormality. Electronically Signed   By: Marcello Moores  Register   On: 11/05/2019 09:44   VAS [place] LOWER EXTREMITY VENOUS (DVT) (MC and [facility] 7a-7p)  Result Date: 11/07/2019  Lower Venous DVTStudy Other Indications: Diagnosed with ruptured baker's cyst 2 days ago, now with                    worsening pain/ swelling/ cannot put weight on leg. Limitations: Body habitus. Comparison Study: 11/05/19 Performing Technologist: June Leap RDMS, RVT  Examination Guidelines: A complete evaluation includes B-mode imaging, spectral Doppler, color Doppler, and power Doppler as needed of all accessible portions of each vessel. Bilateral testing is considered an integral part of a complete examination. Limited examinations for reoccurring indications may be performed as noted. The reflux portion of the exam is performed with the patient in reverse Trendelenburg.  +---------+---------------+---------+-----------+----------+--------------+  LEFT       Compressibility Phasicity Spontaneity Properties Thrombus Aging  +---------+---------------+---------+-----------+----------+--------------+  CFV       Full            Yes       Yes                                    +---------+---------------+---------+-----------+----------+--------------+  SFJ       Full                                                             +---------+---------------+---------+-----------+----------+--------------+  FV Prox   Full                                                             +---------+---------------+---------+-----------+----------+--------------+  FV Mid    Full                                                             +---------+---------------+---------+-----------+----------+--------------+  FV Distal Full                                                             +---------+---------------+---------+-----------+----------+--------------+  PFV       Full                                                             +---------+---------------+---------+-----------+----------+--------------+  POP       Full            Yes       Yes                                    +---------+---------------+---------+-----------+----------+--------------+  PTV       Full                                                             +---------+---------------+---------+-----------+----------+--------------+  PERO      Full                                                             +---------+---------------+---------+-----------+----------+--------------+     Summary: LEFT: - There is no evidence of deep vein thrombosis in the lower extremity.  Ruptured baker's cyst. Appears mostly unchanged from previous study.  *See table(s) above for measurements and observations. Electronically signed by Monica Martinez MD on 11/07/2019 at 4:09:11 PM.    Final    VAS [place] LOWER EXTREMITY VENOUS (DVT) (ONLY MC & WL 7a-7p)  Result Date: 11/05/2019  Lower Venous DVTStudy Indications: Swelling, and  Pain.  Comparison Study: 02/24/19 Performing Technologist: June Leap RDMS, RVT  Examination Guidelines: A complete evaluation includes B-mode imaging, spectral Doppler, color Doppler, and power Doppler as needed of all accessible portions of each vessel. Bilateral testing is considered an integral part of a complete examination. Limited examinations for reoccurring indications may be performed as noted. The reflux portion of the exam is performed with the patient in reverse Trendelenburg.  +-----+---------------+---------+-----------+----------+--------------+  RIGHT Compressibility Phasicity Spontaneity Properties Thrombus Aging  +-----+---------------+---------+-----------+----------+--------------+  CFV                   Yes       Yes                                    +-----+---------------+---------+-----------+----------+--------------+   +---------+---------------+---------+-----------+----------+--------------+  LEFT      Compressibility Phasicity Spontaneity Properties Thrombus Aging  +---------+---------------+---------+-----------+----------+--------------+  CFV       Full            Yes       Yes                                    +---------+---------------+---------+-----------+----------+--------------+  SFJ       Full                                                             +---------+---------------+---------+-----------+----------+--------------+  FV Prox   Full                                                             +---------+---------------+---------+-----------+----------+--------------+  FV Mid    Full                                                             +---------+---------------+---------+-----------+----------+--------------+  FV Distal Full                                                             +---------+---------------+---------+-----------+----------+--------------+  PFV       Full                                                              +---------+---------------+---------+-----------+----------+--------------+  POP       Full            Yes       Yes                                    +---------+---------------+---------+-----------+----------+--------------+  PTV       Full                                                             +---------+---------------+---------+-----------+----------+--------------+  PERO      Full                                                             +---------+---------------+---------+-----------+----------+--------------+     Summary: RIGHT: - No evidence of common femoral vein obstruction.  LEFT: - There is no evidence of deep vein thrombosis in the lower extremity.  - Fluid collection extending from popliteal fossa to proximal calf, appears to be ruptured baker's cyst.  *See table(s) above for measurements and observations. Electronically signed by Deitra Mayo MD on 11/05/2019 at 6:09:23 PM.    Final     Lab Data:  CBC: Recent Labs  Lab 11/07/19 0930 11/08/19 0503  WBC 9.5 7.9  NEUTROABS 7.3  --   HGB 11.9* 11.2*  HCT 36.1* 34.1*  MCV 93.0 93.9  PLT 387 542   Basic Metabolic Panel: Recent Labs  Lab 11/07/19 0930 11/08/19 0503  NA 136 138  K 4.4 4.4  CL 101 102  CO2 25 27  GLUCOSE 99 101*  BUN 63* 64*  CREATININE 2.09* 2.00*  CALCIUM 8.0* 7.9*   GFR: Estimated Creatinine Clearance: 54.8 mL/min (A) (by C-G formula based on SCr of 2 mg/dL (H)). Liver Function Tests: Recent Labs  Lab 11/08/19 0503  AST 13*  ALT 14  ALKPHOS 49  BILITOT 0.6  PROT 5.9*  ALBUMIN 1.7*   No results for input(s): LIPASE, AMYLASE in the last 168 hours. No results for input(s): AMMONIA in the last 168 hours. Coagulation Profile: No results for input(s): INR, PROTIME in the last 168 hours. Cardiac Enzymes: Recent Labs  Lab 11/07/19 0930  CKTOTAL 280   BNP (last 3 results) No results for input(s): PROBNP in the last 8760 hours. HbA1C: No results for input(s): HGBA1C in the last  72 hours. CBG: No results for input(s): GLUCAP in the last 168 hours. Lipid Profile: No results for input(s): CHOL, HDL, LDLCALC, TRIG, CHOLHDL, LDLDIRECT in the last 72 hours. Thyroid Function Tests: No results for input(s): TSH, T4TOTAL, FREET4, T3FREE, THYROIDAB in the last 72 hours. Anemia Panel: No results for input(s): VITAMINB12, FOLATE, FERRITIN, TIBC, IRON, RETICCTPCT in the last 72 hours. Urine analysis:    Component Value Date/Time   COLORURINE YELLOW 11/07/2019 1130   APPEARANCEUR CLEAR 11/07/2019 1130   APPEARANCEUR Cloudy (A) 04/01/2019 1246   LABSPEC 1.010 11/07/2019 1130   PHURINE 5.0 11/07/2019 1130   GLUCOSEU NEGATIVE 11/07/2019 1130   HGBUR NEGATIVE 11/07/2019 1130   BILIRUBINUR NEGATIVE 11/07/2019 1130   BILIRUBINUR Negative 04/01/2019 1246   KETONESUR NEGATIVE 11/07/2019 1130   PROTEINUR >=300 (A) 11/07/2019 1130   NITRITE NEGATIVE 11/07/2019 1130   LEUKOCYTESUR NEGATIVE 11/07/2019 1130     Nelma Phagan M.D. [facility] 11/08/2019, 10:40 AM   Call night coverage person covering after 7pm

## 2019-11-08 NOTE — Progress Notes (Signed)
Lower extremity venous has been completed.   Preliminary results in CV Proc.   Blanch Media 11/08/2019 10:54 AM

## 2019-11-08 NOTE — Evaluation (Signed)
Physical Therapy Evaluation Patient Details Name: Darren Hamilton MRN: [id number] DOB: 02/02/1961 Today's Date: 11/08/2019   History of Present Illness  59 y.o. male with a history of hypertension, hyperlipidemia, asthma, anxiety, & nephrotic syndrome who returns to the ED with complaints of worsening pain/swelling to the left lower extremity since onset 05/31.  Patient was seen in the emergency department 11/05/2019 for evaluation of similar symptoms, had left knee x-ray that was negative and LLE venous duplex that was negative for DVT, however did reveal ruptured baker's cyst. He was discharged home with norco which he has been taking with mild relief. Was told to compress & elevate, has not been compressing the area but has been elevating and applying ice. He states since last visit pain seems worse in the foot and behind the knee and that the swelling may be a bit worse. He feels he cannot bear weight. Denies fever, chills, numbness, weakness, chest pain, dyspnea, recent surgery/trauma, recent long travel, hormone use, personal hx of cancer, or hx of DVT/PE.    Clinical Impression  Pt admitted with above diagnosis. Pt significantly limited by pain in BLE. Pt with new RLE pain, specifically at anterior knee, ankle, and arch of foot that began overnight limiting his ability to move leg in nonweightbearing position and limiting ability to bear weight. Pt required significantly increased time to come to EOB with heavy dependence on BUE to bring trunk upright and therapist providing gentle assist at BLE, moving one leg at a time due to high sensitivity to light touch. Pt moaning in pain with bil sock donning and bed mobility. Pt unable to rise and take steps with therapist, unable to push through feet to laterally scoot up towards HOB due to high pain. RN and MD entered room during evaluation and noted pt's deficits and high pain. 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 Recommendations made  with consideration to pain management allowing pt to be more mobile. Pt currently with functional limitations due to the deficits listed below (see PT Problem List). Pt will benefit from skilled PT to increase their independence and safety with mobility to allow discharge to the venue listed below.       Follow Up Recommendations Home health PT;Supervision - Intermittent(pending improvement in pain and mobility)    Equipment Recommendations  3in1 (PT)    Recommendations for Other Services       Precautions / Restrictions Precautions Precautions: Fall Precaution Comments: high pain/tenderness to light touch BLE Restrictions Weight Bearing Restrictions: No      Mobility  Bed Mobility Overal bed mobility: Needs Assistance Bed Mobility: Supine to Sit;Sit to Supine Rolling: Min assist  Supine to sit: Min assist Sit to supine: Min assist  General bed mobility comments: min assist to bring BLE, requiring single LE sliding to EOB at a time 2* pain; min assist to elevate BLE back into bed with careful hand placement 2* pain  Transfers  General transfer comment: able to rest R foot on floor with minimal increase in pain, unable to bear weight or rise on either foot 2* pain  Ambulation/Gait  General Gait Details: unable 2* pain  Stairs            Wheelchair Mobility    Modified Rankin (Stroke Patients Only)       Balance Overall balance assessment: Needs assistance Sitting-balance support: Feet unsupported;Bilateral upper extremity supported Sitting balance-Leahy Scale: Good Sitting balance - Comments: seated EOB, R foot only supported on floor  Standing balance  comment: unable 2* pain        Pertinent Vitals/Pain Pain Assessment: 0-10 Pain Score: 10-Worst pain ever Pain Location: Bilateral LEs with movement Pain Descriptors / Indicators: Grimacing;Guarding;Moaning;Crushing Pain Intervention(s): Limited activity within patient's tolerance;Monitored during  session;Premedicated before session;Repositioned;RN gave pain meds during session    [place] expects to be discharged to:: Private residence Living Arrangements: Spouse/significant other;Children Available Help at Discharge: Family   Home Access: Stairs to enter   Technical brewer of Steps: 3 with L handrail at side, 2 without handrail at front Home Layout: Two level;Able to live on main level with bedroom/bathroom Home Equipment: Gilford Rile - 2 wheels;Crutches      Prior Function Level of Independence: Independent         Comments: Patient reports using a walker recently due to LE weakness. 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     Therapist, sports Extremity Assessment Upper Extremity Assessment: Overall WFL for tasks assessed    Lower Extremity Assessment Lower Extremity Assessment: Defer to PT evaluation RLE Deficits / Details: ankle AROM <50% 2* pain, hip/knee AROM 50% 2* pain. extreme sensitivity to light touch at R anterior knee, R ankle, R arch of foot RLE: Unable to fully assess due to pain LLE Deficits / Details: ankle/knee/hip AROM <50% 2* pain. extreme sensitivity to light touch at L knee and ankle LLE: Unable to fully assess due to pain    Cervical / Trunk Assessment Cervical / Trunk Assessment: Normal  Communication   Communication: No difficulties  Cognition Arousal/Alertness: Awake/alert Behavior During Therapy: WFL for tasks assessed/performed Overall Cognitive Status: Within Functional Limits for tasks assessed           General Comments General comments (skin integrity, edema, etc.): left lower leg with ace bandage intact    Exercises     Assessment/Plan    PT Assessment Patient needs continued PT services  PT Problem List Decreased strength;Decreased range of motion;Decreased activity tolerance;Decreased balance;Decreased mobility;Impaired sensation;Pain       PT Treatment  Interventions DME instruction;Gait training;Stair training;Functional mobility training;Therapeutic activities;Therapeutic exercise;Balance training;Neuromuscular re-education;Patient/family education;Modalities    PT Goals (Current goals can be found in the Care Plan section)  Acute Rehab PT Goals Patient Stated Goal: walk PT Goal Formulation: With patient Time For Goal Achievement: 11/15/19 Potential to Achieve Goals: Good    Frequency Min 3X/week   Barriers to discharge        Co-evaluation               AM-PAC PT "6 Clicks" Mobility  Outcome Measure Help needed turning from your back to your side while in a flat bed without using bedrails?: A Little Help needed moving from lying on your back to sitting on the side of a flat bed without using bedrails?: A Little Help needed moving to and from a bed to a chair (including a wheelchair)?: Total Help needed standing up from a chair using your arms (e.g., wheelchair or bedside chair)?: Total Help needed to walk in hospital room?: Total Help needed climbing 3-5 steps with a railing? : Total 6 Click Score: 10    End of Session   Activity Tolerance: Patient limited by pain Patient left: in bed;with call bell/phone within reach;with bed alarm set;with nursing/sitter in room Nurse Communication: Mobility status PT Visit Diagnosis: Other abnormalities of gait and mobility (R26.89);Pain Pain - Right/Left: (bil) Pain - part of body: Ankle and joints of foot;Leg  Time: 3709-6438 PT Time Calculation (min) (ACUTE ONLY): 37 min   Charges:   PT Evaluation $PT Eval Moderate Complexity: 1 Mod PT Treatments $Therapeutic Activity: 8-22 mins         Tori Lauralei Clouse PT, DPT 11/08/19, 1:53 PM

## 2019-11-09 ENCOUNTER — Encounter (HOSPITAL_COMMUNITY): Payer: Self-pay | Admitting: Internal Medicine

## 2019-11-09 DIAGNOSIS — F320 Major depressive disorder, single episode, mild: Secondary | ICD-10-CM | POA: Diagnosis not present

## 2019-11-09 DIAGNOSIS — I509 Heart failure, unspecified: Secondary | ICD-10-CM | POA: Diagnosis not present

## 2019-11-09 DIAGNOSIS — L03116 Cellulitis of left lower limb: Secondary | ICD-10-CM | POA: Diagnosis not present

## 2019-11-09 DIAGNOSIS — N1831 Chronic kidney disease, stage 3a: Secondary | ICD-10-CM | POA: Diagnosis not present

## 2019-11-09 DIAGNOSIS — I129 Hypertensive chronic kidney disease with stage 1 through stage 4 chronic kidney disease, or unspecified chronic kidney disease: Secondary | ICD-10-CM | POA: Diagnosis not present

## 2019-11-09 DIAGNOSIS — N179 Acute kidney failure, unspecified: Secondary | ICD-10-CM | POA: Diagnosis not present

## 2019-11-09 DIAGNOSIS — N049 Nephrotic syndrome with unspecified morphologic changes: Secondary | ICD-10-CM | POA: Diagnosis not present

## 2019-11-09 LAB — CBC
HCT: 33.1 % — ABNORMAL LOW (ref 39.0–52.0)
Hemoglobin: 11.1 g/dL — ABNORMAL LOW (ref 13.0–17.0)
MCH: 31.1 pg (ref 26.0–34.0)
MCHC: 33.5 g/dL (ref 30.0–36.0)
MCV: 92.7 fL (ref 80.0–100.0)
Platelets: 385 10*3/uL (ref 150–400)
RBC: 3.57 MIL/uL — ABNORMAL LOW (ref 4.22–5.81)
RDW: 13.2 % (ref 11.5–15.5)
WBC: 8.6 10*3/uL (ref 4.0–10.5)
nRBC: 0 % (ref 0.0–0.2)

## 2019-11-09 LAB — BASIC METABOLIC PANEL
Anion gap: 9 (ref 5–15)
BUN: 70 mg/dL — ABNORMAL HIGH (ref 6–20)
CO2: 24 mmol/L (ref 22–32)
Calcium: 7.8 mg/dL — ABNORMAL LOW (ref 8.9–10.3)
Chloride: 101 mmol/L (ref 98–111)
Creatinine, Ser: 2.04 mg/dL — ABNORMAL HIGH (ref 0.61–1.24)
GFR calc Af Amer: 41 mL/min — ABNORMAL LOW (ref >60)
GFR calc non Af Amer: 35 mL/min — ABNORMAL LOW (ref >60)
Glucose, Bld: 111 mg/dL — ABNORMAL HIGH (ref 70–99)
Potassium: 4.4 mmol/L (ref 3.5–5.1)
Sodium: 134 mmol/L — ABNORMAL LOW (ref 135–145)

## 2019-11-09 LAB — URIC ACID: Uric Acid, Serum: 15.6 mg/dL — ABNORMAL HIGH (ref 3.7–8.6)

## 2019-11-09 LAB — CK: Total CK: 300 U/L (ref 49–397)

## 2019-11-09 MED ORDER — ALBUMIN HUMAN 25 % IV SOLN: 25.0000 g | Freq: Two times a day (BID) | INTRAVENOUS | Status: DC

## 2019-11-09 MED ORDER — POLYETHYLENE GLYCOL 3350 17 G PO PACK
17.0000 g | PACK | Freq: Every day | ORAL | Status: DC
  Administered 2019-11-10 – 2019-11-17 (×3): 17 g via ORAL
  Filled 2019-11-09 (×7): qty 1

## 2019-11-09 MED ORDER — FUROSEMIDE 10 MG/ML IJ SOLN
60.0000 mg | Freq: Two times a day (BID) | INTRAMUSCULAR | Status: DC
  Administered 2019-11-09 – 2019-11-11 (×4): 60 mg via INTRAVENOUS
  Filled 2019-11-09 (×4): qty 6

## 2019-11-09 MED ORDER — ALBUMIN HUMAN 25 % IV SOLN
25.0000 g | Freq: Two times a day (BID) | INTRAVENOUS | Status: AC
  Administered 2019-11-09 – 2019-11-12 (×6): 25 g via INTRAVENOUS
  Filled 2019-11-09 (×7): qty 100

## 2019-11-09 NOTE — Progress Notes (Signed)
[facility]                                                                              Patient Demographics  Darren Hamilton, is a 59 y.o. male, DOB - [birth year], YBW:[id number]  Admit date - 11/07/2019   Admitting Physician Little Ishikawa, MD  Outpatient Primary MD for the patient is Eunice Blase, MD  Outpatient specialists:   LOS - 1  days   Medical records reviewed and are as summarized below:    Chief Complaint  Patient presents with  . Leg Swelling  . Foot Pain       Brief summary   Patient is a 59 year old male with history of hypertension, nephrotic syndrome (FSGS, Dr Posey Pronto, on tacrolimus), asthma, anxiety, recently evaluated for ruptured Baker's cyst in his left lower extremity posteriorly.  Over the past 72 hours prior to admission, patients pain and swelling continued to worsen, with ambulation.  He had tried treatment at home with elevation, pain medication.  Patient was evaluated by his PCP for enrollment in PT. patient for the pain is worse with weightbearing and range of motion on the left ankle.  He also reported poor p.o. intake in the last 3 days due to pain and poor ambulatory dysfunction. In ED, noted to have elevated ESR, CRP concerning for infectious process.  Doppler ultrasound left lower extremity showed ruptured Baker's cyst, no DVT.  Assessment & Plan    Principal Problem:   Cellulitis of left lower extremity, ruptured Baker's cyst, intractable pain, POA -Elevated ESR, 131, CRP at the time of admission -Continue IV cefazolin, elevation of the leg, not very convinced with cellulitis, no significant improvement in pain -CT showed edema questionably cellulitis versus post Baker's cyst rupture inflammation -CK within normal limits, no myositis  Active Problems: Intractable pain, recent Baker's cyst rupture left lower extremity, now right lower extremity pain -Patient continues to complain of intractable pain in left lower  extremity.  Also complains of pain in right lower extremity. -Doppler ultrasound bilateral lower extremity negative for DVT, showed ruptured Baker's cyst in the left - patient had been doing icing and pain control with no significant improvement, now difficulty walking with intractable pain.  On examination, peripheral edema however patient's diuretics has been held due to AKI (was on Demadex, metolazone outpatient).  Does not appear to have myositis or compartment syndrome or abscess, CT negative for any fluid collection -Patient denies any lumbar spine pain radiating to the legs, states mostly below knee to foot.  Has chronic thoracic spine occasional pain due to history of scoliosis - Discussed with Dr. Tamera Punt, ortho, will see patient for further recommendations    -Continue pain control,  bowel regimen  Acute kidney injury, has history of FSGS on immunosuppressants -Creatinine has plateaued, 2.0 no improvement with IV fluids -Patient is on tacrolimus, will check with pharmacy if dosing appropriate with renal function -IV fluids discontinued, peripheral edema in legs not helping his overall situation with lower extremity pain -Discussed with nephrology, Dr. Jonnie Finner, currently lisinopril, Demadex, metolazone on hold, may need to be restarted due to increasing edema, will await renal recommendation  Hyperlipidemia -Continue gemfibrozil    Essential hypertension -BP currently elevated due to pain, continue hydralazine IV with parameters Currently metolazone, torsemide and lisinopril are on hold   Obesity Estimated body mass index is 44.31 kg/m as calculated from the following:   Height as of this encounter: 5' 8"  (1.727 m).   Weight as of this encounter: 132.2 kg.  Code Status: full  DVT Prophylaxis:  Lovenox  Family Communication: Discussed all imaging results, lab results, explained to the patient    Disposition Plan:     Status is: Inpatient  The patient will require care  spanning > 2 midnights and should be moved to inpatient because: Inpatient level of care appropriate due to severity of illness.  Patient is not safe to be discharged due to difficulty ambulating (intractable pain, cellulitis left lower extremity on IV antibiotics)  Dispo: The patient is from: Home              Anticipated d/c is to: Home              Anticipated d/c date is: 2 days              Patient currently is not medically stable to d/c.      Time Spent in minutes 35 minutes  Procedures:  Doppler ultrasound left lower extremity CT tib-fib left lower extremity  Consultants:   Orthopedics, Dr. Tamera Punt Nephrology, Dr. Jonnie Finner  Antimicrobials:   Anti-infectives (From admission, onward)   Start     Dose/Rate Route Frequency Ordered Stop   11/07/19 1445  ceFAZolin (ANCEF) IVPB 2g/100 mL premix     2 g 200 mL/hr over 30 Minutes Intravenous Every 8 hours 11/07/19 1436     11/07/19 1100  ceFAZolin (ANCEF) IVPB 1 g/50 mL premix     1 g 100 mL/hr over 30 Minutes Intravenous  Once 11/07/19 1059 11/07/19 1315         Medications  Scheduled Meds: . docusate sodium  100 mg Oral BID  . enoxaparin (LOVENOX) injection  60 mg Subcutaneous Q24H  . escitalopram  20 mg Oral Daily  . gemfibrozil  600 mg Oral BID WC  . loratadine  10 mg Oral Daily  . tacrolimus  5 mg Oral BID   Continuous Infusions: .  ceFAZolin (ANCEF) IV 2 g (11/09/19 0941)   PRN Meds:.acetaminophen **OR** acetaminophen, albuterol, hydrALAZINE, HYDROmorphone (DILAUDID) injection, ondansetron **OR** ondansetron (ZOFRAN) IV, oxyCODONE-acetaminophen, polyethylene glycol      Subjective:   [name] was seen and examined today.  Continues to complain of intractable pain in the left lower extremity, difficulty lifting up.  Able to wiggle toes but with pain.  States pain knee to the foot, 10/10 on bearing weight.  Also complaining of RLE pain on ambulation. Patient denies dizziness, chest pain, shortness of  breath, abdominal pain, nausea or vomiting  Objective:   Vitals:   11/08/19 0957 11/08/19 1416 11/08/19 2138 11/09/19 0655  BP: (!) 141/91 (!) 148/95 (!) 121/99 (!) 163/89  Pulse: (!) 103 (!) 107 (!) 102 100  Resp: 18 16 20 20   Temp: 97.9 F (36.6 C) 98.1 F (36.7 C) 98.8 F (37.1 C) 98.2 F (36.8 C)  TempSrc: Oral Oral Oral Oral  SpO2: 94% (!) 88% 93% 92%  Weight:      Height:        Intake/Output Summary (Last 24 hours) at 11/09/2019 1214 Last data filed at 11/09/2019 0657 Gross per 24 hour  Intake 240 ml  Output  900 ml  Net -660 ml     Wt Readings from Last 3 Encounters:  11/07/19 132.2 kg  11/05/19 130.6 kg  09/10/19 127.9 kg   Physical Exam  General: Alert and oriented x 3, NAD, uncomfortable  Cardiovascular: S1 S2 clear, RRR.  Respiratory: CTAB  Gastrointestinal: Soft, nontender, nondistended, NBS  Ext: Bilateral lower extremity edema  Neuro: No neurological deficits although difficulty lifting up the leg due to pain, able to wiggle toes  Musculoskeletal: No cyanosis, clubbing  Skin: No rashes  Psych: Normal affect and demeanor, alert and oriented x3    Data Reviewed:  I have personally reviewed following labs and imaging studies  Micro Results Recent Results (from the past 240 hour(s))  SARS Coronavirus 2 by RT PCR (hospital order, performed in [facility] hospital lab) Nasopharyngeal Nasopharyngeal Swab     Status: None   Collection Time: 11/07/19 10:55 AM   Specimen: Nasopharyngeal Swab  Result Value Ref Range Status   SARS Coronavirus 2 NEGATIVE NEGATIVE Final    Comment: (NOTE) SARS-CoV-2 target nucleic acids are NOT DETECTED. The SARS-CoV-2 RNA is generally detectable in upper and lower respiratory specimens during the acute phase of infection. The lowest concentration of SARS-CoV-2 viral copies this assay can detect is 250 copies / mL. A negative result does not preclude SARS-CoV-2 infection and should not be used as the sole basis for  treatment or other patient management decisions.  A negative result may occur with improper specimen collection / handling, submission of specimen other than nasopharyngeal swab, presence of viral mutation(s) within the areas targeted by this assay, and inadequate number of viral copies (<250 copies / mL). A negative result must be combined with clinical observations, patient history, and epidemiological information. Fact Sheet for Patients:   StrictlyIdeas.no Fact Sheet for Healthcare Providers: BankingDealers.co.za This test is not yet approved or cleared  by the [place] FDA and has been authorized for detection and/or diagnosis of SARS-CoV-2 by FDA under an Emergency Use Authorization (EUA).  This EUA will remain in effect (meaning this test can be used) for the duration of the COVID-19 declaration under Section 564(b)(1) of the Act, 21 U.S.C. section 360bbb-3(b)(1), unless the authorization is terminated or revoked sooner. Performed at [facility], [place] [address], [place], [place] [address]     Radiology Reports DG Tibia/Fibula Left  Result Date: 11/07/2019 CLINICAL DATA:  Leg swelling EXAM: LEFT TIBIA AND FIBULA - 2 VIEW COMPARISON:  None. FINDINGS: There is no evidence of fracture or other focal bone lesions. Diffuse soft tissue edema about the included left lower extremity. IMPRESSION: No fracture or dislocation of the left tibia or fibula. Diffuse soft tissue edema. Electronically Signed   By: Eddie Candle M.D.   On: 11/07/2019 09:32   CT TIBIA FIBULA LEFT WO CONTRAST  Result Date: 11/07/2019 CLINICAL DATA:  Left lower leg and foot pain and swelling since 11/05/2019. No known injury. EXAM: CT OF THE LOWER LEFT EXTREMITY WITHOUT CONTRAST TECHNIQUE: Multidetector CT imaging of the lower left extremity was performed according to the standard protocol. COMPARISON:  Plain films the left tibia and fibula today.  FINDINGS: Bones/Joint/Cartilage No bony destructive change or periosteal reaction. No focal bony lesion. No fracture or dislocation. Imaged joints appear normal. Ligaments Suboptimally assessed by CT. Muscles and Tendons No intramuscular fluid collection. No gas within muscle or tracking along fascial planes. There is some fatty atrophy of lower leg musculature which is most notable in the medial gastrocnemius and likely  related to disuse. Soft tissues Diffuse subcutaneous edema is present. No focal fluid collection, soft tissue gas or foreign body. IMPRESSION: Diffuse lower leg subcutaneous edema compatible with dependent change or cellulitis. The exam is otherwise negative. Electronically Signed   By: Inge Rise M.D.   On: 11/07/2019 12:22   CT FOOT LEFT WO CONTRAST  Result Date: 11/07/2019 CLINICAL DATA:  Left lower leg and foot pain and swelling since 11/05/2019. No known injury. EXAM: CT OF THE LEFT FOOT WITHOUT CONTRAST TECHNIQUE: Multidetector CT imaging of the left foot was performed according to the standard protocol. Multiplanar CT image reconstructions were also generated. COMPARISON:  None. FINDINGS: Bones/Joint/Cartilage No bony destructive change or periosteal reaction. No focal bony lesion. No fracture or dislocation. Imaged joints appear normal. Ligaments Suboptimally assessed by CT. Muscles and Tendons Appear normal. Soft tissues Subcutaneous edema is present about the dorsum of the foot. No focal fluid collection, radiopaque foreign body or soft tissue gas. IMPRESSION: Subcutaneous edema consistent with dependent change or cellulitis. The exam is otherwise negative. Electronically Signed   By: Inge Rise M.D.   On: 11/07/2019 12:24   DG Knee Complete 4 Views Left  Result Date: 11/05/2019 CLINICAL DATA:  Midthigh and calf pain swelling. : LEFT KNEE - COMPLETE 4+ VIEW COMPARISON:  No prior. FINDINGS: No acute soft tissue or bony abnormality. No evidence of fracture dislocation.  IMPRESSION: No acute abnormality. Electronically Signed   By: Marcello Moores  Register   On: 11/05/2019 09:44   VAS [place] LOWER EXTREMITY VENOUS (DVT)  Result Date: 11/08/2019  Lower Venous DVTStudy Indications: Pain.  Comparison Study: 11/07/19 Performing Technologist: Abram Sander RVS  Examination Guidelines: A complete evaluation includes B-mode imaging, spectral Doppler, color Doppler, and power Doppler as needed of all accessible portions of each vessel. Bilateral testing is considered an integral part of a complete examination. Limited examinations for reoccurring indications may be performed as noted. The reflux portion of the exam is performed with the patient in reverse Trendelenburg.  +---------+---------------+---------+-----------+----------+--------------+ RIGHT    CompressibilityPhasicitySpontaneityPropertiesThrombus Aging +---------+---------------+---------+-----------+----------+--------------+ CFV      Full           Yes      Yes                                 +---------+---------------+---------+-----------+----------+--------------+ SFJ      Full                                                        +---------+---------------+---------+-----------+----------+--------------+ FV Prox  Full                                                        +---------+---------------+---------+-----------+----------+--------------+ FV Mid   Full                                                        +---------+---------------+---------+-----------+----------+--------------+ FV DistalFull                                                        +---------+---------------+---------+-----------+----------+--------------+  PFV      Full                                                        +---------+---------------+---------+-----------+----------+--------------+ POP      Full           Yes      Yes                                  +---------+---------------+---------+-----------+----------+--------------+ PTV      Full                                                        +---------+---------------+---------+-----------+----------+--------------+ PERO     Full                                                        +---------+---------------+---------+-----------+----------+--------------+   +----+---------------+---------+-----------+----------+--------------+ LEFTCompressibilityPhasicitySpontaneityPropertiesThrombus Aging +----+---------------+---------+-----------+----------+--------------+ CFV Full           Yes      Yes                                 +----+---------------+---------+-----------+----------+--------------+     Summary: RIGHT: - There is no evidence of deep vein thrombosis in the lower extremity.  - No cystic structure found in the popliteal fossa.  LEFT: - No evidence of common femoral vein obstruction.  *See table(s) above for measurements and observations. Electronically signed by Ruta Hinds MD on 11/08/2019 at 7:41:12 PM.    Final    VAS [place] LOWER EXTREMITY VENOUS (DVT) ([facility] and [facility] 7a-7p)  Result Date: 11/07/2019  Lower Venous DVTStudy Other Indications: Diagnosed with ruptured baker's cyst 2 days ago, now with                    worsening pain/ swelling/ cannot put weight on leg. Limitations: Body habitus. Comparison Study: 11/05/19 Performing Technologist: June Leap RDMS, RVT  Examination Guidelines: A complete evaluation includes B-mode imaging, spectral Doppler, color Doppler, and power Doppler as needed of all accessible portions of each vessel. Bilateral testing is considered an integral part of a complete examination. Limited examinations for reoccurring indications may be performed as noted. The reflux portion of the exam is performed with the patient in reverse Trendelenburg.  +---------+---------------+---------+-----------+----------+--------------+ LEFT      CompressibilityPhasicitySpontaneityPropertiesThrombus Aging +---------+---------------+---------+-----------+----------+--------------+ CFV      Full           Yes      Yes                                 +---------+---------------+---------+-----------+----------+--------------+ SFJ      Full                                                        +---------+---------------+---------+-----------+----------+--------------+  FV Prox  Full                                                        +---------+---------------+---------+-----------+----------+--------------+ FV Mid   Full                                                        +---------+---------------+---------+-----------+----------+--------------+ FV DistalFull                                                        +---------+---------------+---------+-----------+----------+--------------+ PFV      Full                                                        +---------+---------------+---------+-----------+----------+--------------+ POP      Full           Yes      Yes                                 +---------+---------------+---------+-----------+----------+--------------+ PTV      Full                                                        +---------+---------------+---------+-----------+----------+--------------+ PERO     Full                                                        +---------+---------------+---------+-----------+----------+--------------+     Summary: LEFT: - There is no evidence of deep vein thrombosis in the lower extremity.  Ruptured baker's cyst. Appears mostly unchanged from previous study.  *See table(s) above for measurements and observations. Electronically signed by Monica Martinez MD on 11/07/2019 at 4:09:11 PM.    Final    VAS [place] LOWER EXTREMITY VENOUS (DVT) (ONLY MC & WL 7a-7p)  Result Date: 11/05/2019  Lower Venous DVTStudy Indications: Swelling, and  Pain.  Comparison Study: 02/24/19 Performing Technologist: June Leap RDMS, RVT  Examination Guidelines: A complete evaluation includes B-mode imaging, spectral Doppler, color Doppler, and power Doppler as needed of all accessible portions of each vessel. Bilateral testing is considered an integral part of a complete examination. Limited examinations for reoccurring indications may be performed as noted. The reflux portion of the exam is performed with the patient in reverse Trendelenburg.  +-----+---------------+---------+-----------+----------+--------------+ RIGHTCompressibilityPhasicitySpontaneityPropertiesThrombus Aging +-----+---------------+---------+-----------+----------+--------------+ CFV                 Yes  Yes                                 +-----+---------------+---------+-----------+----------+--------------+   +---------+---------------+---------+-----------+----------+--------------+ LEFT     CompressibilityPhasicitySpontaneityPropertiesThrombus Aging +---------+---------------+---------+-----------+----------+--------------+ CFV      Full           Yes      Yes                                 +---------+---------------+---------+-----------+----------+--------------+ SFJ      Full                                                        +---------+---------------+---------+-----------+----------+--------------+ FV Prox  Full                                                        +---------+---------------+---------+-----------+----------+--------------+ FV Mid   Full                                                        +---------+---------------+---------+-----------+----------+--------------+ FV DistalFull                                                        +---------+---------------+---------+-----------+----------+--------------+ PFV      Full                                                         +---------+---------------+---------+-----------+----------+--------------+ POP      Full           Yes      Yes                                 +---------+---------------+---------+-----------+----------+--------------+ PTV      Full                                                        +---------+---------------+---------+-----------+----------+--------------+ PERO     Full                                                        +---------+---------------+---------+-----------+----------+--------------+     Summary: RIGHT: - No evidence of  common femoral vein obstruction.  LEFT: - There is no evidence of deep vein thrombosis in the lower extremity.  - Fluid collection extending from popliteal fossa to proximal calf, appears to be ruptured baker's cyst.  *See table(s) above for measurements and observations. Electronically signed by Deitra Mayo MD on 11/05/2019 at 6:09:23 PM.    Final     Lab Data:  CBC: Recent Labs  Lab 11/07/19 0930 11/08/19 0503 11/09/19 0529  WBC 9.5 7.9 8.6  NEUTROABS 7.3  --   --   HGB 11.9* 11.2* 11.1*  HCT 36.1* 34.1* 33.1*  MCV 93.0 93.9 92.7  PLT 387 364 967   Basic Metabolic Panel: Recent Labs  Lab 11/07/19 0930 11/08/19 0503 11/09/19 0529  NA 136 138 134*  K 4.4 4.4 4.4  CL 101 102 101  CO2 25 27 24   GLUCOSE 99 101* 111*  BUN 63* 64* 70*  CREATININE 2.09* 2.00* 2.04*  CALCIUM 8.0* 7.9* 7.8*   GFR: Estimated Creatinine Clearance: 53.7 mL/min (A) (by C-G formula based on SCr of 2.04 mg/dL (H)). Liver Function Tests: Recent Labs  Lab 11/08/19 0503  AST 13*  ALT 14  ALKPHOS 49  BILITOT 0.6  PROT 5.9*  ALBUMIN 1.7*   No results for input(s): LIPASE, AMYLASE in the last 168 hours. No results for input(s): AMMONIA in the last 168 hours. Coagulation Profile: No results for input(s): INR, PROTIME in the last 168 hours. Cardiac Enzymes: Recent Labs  Lab 11/07/19 0930 11/09/19 0529  CKTOTAL 280 300   BNP (last 3  results) No results for input(s): PROBNP in the last 8760 hours. HbA1C: No results for input(s): HGBA1C in the last 72 hours. CBG: No results for input(s): GLUCAP in the last 168 hours. Lipid Profile: No results for input(s): CHOL, HDL, LDLCALC, TRIG, CHOLHDL, LDLDIRECT in the last 72 hours. Thyroid Function Tests: No results for input(s): TSH, T4TOTAL, FREET4, T3FREE, THYROIDAB in the last 72 hours. Anemia Panel: No results for input(s): VITAMINB12, FOLATE, FERRITIN, TIBC, IRON, RETICCTPCT in the last 72 hours. Urine analysis:    Component Value Date/Time   COLORURINE YELLOW 11/07/2019 1130   APPEARANCEUR CLEAR 11/07/2019 1130   APPEARANCEUR Cloudy (A) 04/01/2019 1246   LABSPEC 1.010 11/07/2019 1130   PHURINE 5.0 11/07/2019 1130   GLUCOSEU NEGATIVE 11/07/2019 1130   HGBUR NEGATIVE 11/07/2019 1130   BILIRUBINUR NEGATIVE 11/07/2019 1130   BILIRUBINUR Negative 04/01/2019 1246   KETONESUR NEGATIVE 11/07/2019 1130   PROTEINUR >=300 (A) 11/07/2019 1130   NITRITE NEGATIVE 11/07/2019 1130   LEUKOCYTESUR NEGATIVE 11/07/2019 1130     Jamez Ambrocio M.D. [facility] 11/09/2019, 12:14 PM   Call night coverage person covering after 7pm

## 2019-11-09 NOTE — Progress Notes (Signed)
Occupational Therapy Treatment Patient Details Name: Darren Hamilton MRN: [id number] DOB: [birth year] Today's Date: 11/09/2019    History of present illness 59 y.o. male with a history of hypertension, hyperlipidemia, asthma, anxiety, & nephrotic syndrome who returns to the ED with complaints of worsening pain/swelling to the left lower extremity since onset 05/31.  Patient was seen in the emergency department 11/05/2019 for evaluation of similar symptoms, had left knee x-ray that was negative and LLE venous duplex that was negative for DVT, however did reveal ruptured baker's cyst. He was discharged home with norco which he has been taking with mild relief. Was told to compress & elevate, has not been compressing the area but has been elevating and applying ice. He states since last visit pain seems worse in the foot and behind the knee and that the swelling may be a bit worse. He feels he cannot bear weight. Denies fever, chills, numbness, weakness, chest pain, dyspnea, recent surgery/trauma, recent long travel, hormone use, personal hx of cancer, or hx of DVT/PE.   OT comments  Treatment focused on tolerating transfers, bed mobility and sitting tolerance as well as edema management. Encouraged patient to move legs in bed and to perform knee extension when sitting at side of bed. Pain continues to be a limiting factor and unable to bear any weight through lower extremities. Patient repositioned with legs elevated above heart per ortho suggestion. Cont POC   Follow Up Recommendations  Home health OT;Supervision/Assistance - 24 hour    Equipment Recommendations  Tub/shower seat    Recommendations for Other Services      Precautions / Restrictions Precautions Precautions: Fall Precaution Comments: high pain/tenderness to light touch BLE; recommended legs stay elevated above heart as much as possible for edema management Restrictions Weight Bearing Restrictions: No       Mobility Bed  Mobility               General bed mobility comments: Min assist to transfer to side of bed today with patient using bed rails to assist with transfer and therapist supported LLE as well as return to supine. Patient continues to be significantly limited by pain. Patient able to side of bed with feet lightly resting on floor with reports of pain. Perfomred knee extension reps to promote muscle movement for edema management with poor tolerance. Returned to supine with legs elevated abo  Transfers                      Balance                                           ADL either performed or assessed with clinical judgement   ADL                                               Vision       Perception     Praxis      Cognition Arousal/Alertness: Awake/alert Behavior During Therapy: WFL for tasks assessed/performed Overall Cognitive Status: Within Functional Limits for tasks assessed  Exercises Other Exercises Other Exercises: L Knee Extension x 2 Other Exercises: R knee Extension x 10   Shoulder Instructions       General Comments      Pertinent Vitals/ Pain       Pain Assessment: Faces Faces Pain Scale: Hurts whole lot Pain Location: Bilateral LEs with movement Pain Descriptors / Indicators: Grimacing;Guarding;Moaning;Crushing Pain Intervention(s): Limited activity within patient's tolerance;Premedicated before session  Home Living                                          Prior Functioning/Environment              Frequency  Min 2X/week        Progress Toward Goals  OT Goals(current goals can now be found in the care plan section)        Plan Discharge plan remains appropriate;Frequency remains appropriate    Co-evaluation                 AM-PAC OT "6 Clicks" Daily Activity     Outcome Measure                     End of Session    OT Visit Diagnosis: Pain;Muscle weakness (generalized) (M62.81) Pain - part of body: Leg;Ankle and joints of foot   Activity Tolerance Patient limited by pain   Patient Left in bed;with call bell/phone within reach   Nurse Communication (condom cath off)        Time: 2694-8546 OT Time Calculation (min): 17 min  Charges: OT General Charges $OT Visit: 1 Visit OT Treatments $Therapeutic Activity: 8-22 mins  Derl Barrow, OTR/L Acute Care Rehab Services  Office [phone]    Lenward Chancellor 11/09/2019, 4:11 PM

## 2019-11-09 NOTE — Consult Note (Signed)
Reason for Consult:Bilateral lower extremity pain, rule out compartment syndrome Referring Physician: Dr. Domenick Bookbinder Abdulkarim Darren Hamilton is an 59 y.o. male.  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  Within the last 3 days the right lower extremity has also become more swollen and painful.  The pain initially began in his knee and calf and now has moved on the left side down mostly into his foot.  He has not had any signs or symptoms of infection.  He has been on a prednisone taper due to his kidney disease.  He also has been off of diuretics.  Past Medical History:  Diagnosis Date  . Anxiety   . Asthma   . Depression   . Hypertension   . Nephrotic syndrome     Past Surgical History:  Procedure Laterality Date  . RENAL BIOPSY      Family History  Problem Relation Age of Onset  . Heart failure Mother   . Heart failure Father     Social History:  reports that he has never smoked. He has never used smokeless tobacco. He reports current alcohol use of about 3.0 standard drinks of alcohol per week. He reports that he does not use drugs.  Allergies: No Known Allergies  Medications: I have reviewed the patient's current medications.  Results for orders placed or performed during the hospital encounter of 11/07/19 (from the past 48 hour(s))  HIV Antibody (routine testing w rflx)     Status: None   Collection Time: 11/07/19  2:32 PM  Result Value Ref Range   HIV Screen 4th Generation wRfx Non Reactive Non Reactive    Comment: Performed at [facility], [address], [place], [place] [address]  Comprehensive metabolic panel     Status: Abnormal   Collection Time: 11/08/19  5:03 AM  Result Value Ref Range   Sodium 138 135 - 145 mmol/L   Potassium 4.4 3.5 - 5.1 mmol/L   Chloride 102 98 - 111 mmol/L   CO2 27 22 - 32 mmol/L   Glucose, Bld 101 (H) 70 - 99 mg/dL    Comment: Glucose  reference range applies only to samples taken after fasting for at least 8 hours.   BUN 64 (H) 6 - 20 mg/dL   Creatinine, Ser 3.89 (H) 0.61 - 1.24 mg/dL   Calcium 7.9 (L) 8.9 - 10.3 mg/dL   Total Protein 5.9 (L) 6.5 - 8.1 g/dL   Albumin 1.7 (L) 3.5 - 5.0 g/dL   AST 13 (L) 15 - 41 U/L   ALT 14 0 - 44 U/L   Alkaline Phosphatase 49 38 - 126 U/L   Total Bilirubin 0.6 0.3 - 1.2 mg/dL   GFR calc non Af Amer 36 (L) >60 mL/min   GFR calc Af Amer 42 (L) >60 mL/min   Anion gap 9 5 - 15    Comment: Performed at [facility], [address] [place], [place] [address]  CBC     Status: Abnormal   Collection Time: 11/08/19  5:03 AM  Result Value Ref Range   WBC 7.9 4.0 - 10.5 K/uL   RBC 3.63 (L) 4.22 - 5.81 MIL/uL   Hemoglobin 11.2 (L) 13.0 - 17.0 g/dL   HCT 87.6 (L) 81.1 - 57.2 %   MCV 93.9 80.0 - 100.0 fL   MCH 30.9 26.0 - 34.0 pg   MCHC  32.8 30.0 - 36.0 g/dL   RDW 13.2 11.5 - 15.5 %   Platelets 364 150 - 400 K/uL   nRBC 0.0 0.0 - 0.2 %    Comment: Performed at [facility], [place] [address] [place], [place] [address]  CBC     Status: Abnormal   Collection Time: 11/09/19  5:29 AM  Result Value Ref Range   WBC 8.6 4.0 - 10.5 K/uL   RBC 3.57 (L) 4.22 - 5.81 MIL/uL   Hemoglobin 11.1 (L) 13.0 - 17.0 g/dL   HCT 33.1 (L) 39.0 - 52.0 %   MCV 92.7 80.0 - 100.0 fL   MCH 31.1 26.0 - 34.0 pg   MCHC 33.5 30.0 - 36.0 g/dL   RDW 13.2 11.5 - 15.5 %   Platelets 385 150 - 400 K/uL   nRBC 0.0 0.0 - 0.2 %    Comment: Performed at [facility], [place] [address] [place], [place] [address]  Basic metabolic panel     Status: Abnormal   Collection Time: 11/09/19  5:29 AM  Result Value Ref Range   Sodium 134 (L) 135 - 145 mmol/L   Potassium 4.4 3.5 - 5.1 mmol/L   Chloride 101 98 - 111 mmol/L   CO2 24 22 - 32 mmol/L   Glucose, Bld 111 (H) 70 - 99 mg/dL    Comment: Glucose reference range applies only to samples taken after fasting for at least 8  hours.   BUN 70 (H) 6 - 20 mg/dL   Creatinine, Ser 2.04 (H) 0.61 - 1.24 mg/dL   Calcium 7.8 (L) 8.9 - 10.3 mg/dL   GFR calc non Af Amer 35 (L) >60 mL/min   GFR calc Af Amer 41 (L) >60 mL/min   Anion gap 9 5 - 15    Comment: Performed at [facility], [place] [address], [place], [place] [address]  CK     Status: None   Collection Time: 11/09/19  5:29 AM  Result Value Ref Range   Total CK 300 49 - 397 U/L    Comment: Performed at [facility], [place] [address] [place],  [address]    VAS [place] LOWER EXTREMITY VENOUS (DVT)  Result Date: 11/08/2019  Lower Venous DVTStudy Indications: Pain.  Comparison Study: 11/07/19 Performing Technologist: Abram Sander RVS  Examination Guidelines: A complete evaluation includes B-mode imaging, spectral Doppler, color Doppler, and power Doppler as needed of all accessible portions of each vessel. Bilateral testing is considered an integral part of a complete examination. Limited examinations for reoccurring indications may be performed as noted. The reflux portion of the exam is performed with the patient in reverse Trendelenburg.  +---------+---------------+---------+-----------+----------+--------------+ RIGHT    CompressibilityPhasicitySpontaneityPropertiesThrombus Aging +---------+---------------+---------+-----------+----------+--------------+ CFV      Full           Yes      Yes                                 +---------+---------------+---------+-----------+----------+--------------+ SFJ      Full                                                        +---------+---------------+---------+-----------+----------+--------------+ FV Prox  Full                                                        +---------+---------------+---------+-----------+----------+--------------+  FV Mid   Full                                                         +---------+---------------+---------+-----------+----------+--------------+ FV DistalFull                                                        +---------+---------------+---------+-----------+----------+--------------+ PFV      Full                                                        +---------+---------------+---------+-----------+----------+--------------+ POP      Full           Yes      Yes                                 +---------+---------------+---------+-----------+----------+--------------+ PTV      Full                                                        +---------+---------------+---------+-----------+----------+--------------+ PERO     Full                                                        +---------+---------------+---------+-----------+----------+--------------+   +----+---------------+---------+-----------+----------+--------------+ LEFTCompressibilityPhasicitySpontaneityPropertiesThrombus Aging +----+---------------+---------+-----------+----------+--------------+ CFV Full           Yes      Yes                                 +----+---------------+---------+-----------+----------+--------------+     Summary: RIGHT: - There is no evidence of deep vein thrombosis in the lower extremity.  - No cystic structure found in the popliteal fossa.  LEFT: - No evidence of common femoral vein obstruction.  *See table(s) above for measurements and observations. Electronically signed by Fabienne Bruns MD on 11/08/2019 at 7:41:12 PM.    Final     Review of Systems  All other systems reviewed and are negative.  Blood pressure (!) 134/99, pulse 97, temperature 98.6 F (37 C), temperature source Oral, resp. rate 15, height 5\' 8"  (1.727 m), weight 132.2 kg, SpO2 94 %. Physical Exam  Constitutional: He is oriented to person, place, and time. He appears well-developed and well-nourished.  HENT:  Head: Atraumatic.  Eyes: EOM are normal.   Cardiovascular: Intact distal pulses.  Respiratory: Effort normal.  Musculoskeletal:     Comments: Bilateral lower extremities with general edema below the knees worse in the feet and ankles.  There is no warmth erythema.  There is no tense swelling  in the calves or feet.  He has no significant tenderness to deep palpation in his calves at this time.  He does have discomfort with palpation of bilateral feet which have significant edema but no significant erythema.  He is grossly neurovascularly intact and can wiggle his toes and ankles with some discomfort.  Neurological: He is alert and oriented to person, place, and time.  Psychiatric: He has a normal mood and affect.    Assessment/Plan: There is no concern for compartment syndrome I believe his pain is secondary to excess fluid in his legs. Agree with getting nephrology involved and tuning up his fluid management I would recommend TED hose bilaterally and strict elevation above his heart on pillows I will sign off as I do not believe there is an orthopedic issue at this time.  Darren Hamilton 11/09/2019, 1:53 PM

## 2019-11-09 NOTE — Progress Notes (Signed)
PT Cancellation Note  Patient Details Name: Ivory Bail MRN: [id number] DOB: [birth year]   Cancelled Treatment:    Reason Eval/Treat Not Completed: Pain limiting ability to participate - Pt states bilateral LEs in 10/10 pain, requests PT to check back tomorrow.   Richrd Sox, PT Acute Rehabilitation Services Pager [phone]  Office [phone]    Tyrone Apple D Despina Hidden 11/09/2019, 12:59 PM

## 2019-11-09 NOTE — Consult Note (Signed)
Renal Service Consult Note [facility]  Darren Hamilton [facility] 11/09/2019 Darren Hamilton Requesting Physician:  Dr Darren Hamilton  Reason for Consult:  Renal insuffiency HPI: The patient is a 59 y.o. year-old w/ hx of HTN, nephrotic syndrome, depression and anxiety presented to ED on 6/3 for leg pain, cyst on leg and ambulation difficulty. In ED pt had ^ESR/ CRP concerning for infection. Pt was admitted for acute cellulitis of the LLE after rupture of a Baker's cyst, started on IV abx. Creat was up at 2.04 w/ BUN 70.  Pt has hx of neph syndrome. Asked to see for renal failure.   Pt states onset neph syndrome around Nov 2020 when bx done for neph syndrome showed FSGS. Pt started on po steroids 80 / d and improved and was tapered off around Feb/ March 2021. Edema returned and he was restarted on pred this time 60 mg and response will not as good as first time. Due to this and steroid side effects, steroids were taken off and pt started on Prograf which was started around 1 wk ago at '5mg'$  bid. Pt developed week some back pain , and then this week developed L leg pain and swelling. Then the R leg swelled up worse as well.  Pt was admitted as above.    Dopplers have shown no DVT.  Patient having a lot of pain in the bilat knees and feet. No hx gout. Denies any SOB, nsaid use, CP, confusion, change in voiding habits.   B/Cr higher than usual. Per dr Darren Hamilton b/l creat is around 1.3-1.4.     ROS  denies CP  no joint pain   no HA  no blurry vision  no rash  no diarrhea  no nausea/ vomiting   Past Medical History  Past Medical History:  Diagnosis Date  . Anxiety   . Asthma   . Depression   . Hypertension   . Nephrotic syndrome    Past Surgical History  Past Surgical History:  Procedure Laterality Date  . RENAL BIOPSY     Family History  Family History  Problem Relation Age of Onset  . Heart failure Mother   . Heart failure Father    Social History  reports that he has never  smoked. He has never used smokeless tobacco. He reports current alcohol use of about 3.0 standard drinks of alcohol per week. He reports that he does not use drugs. Allergies No Known Allergies Home medications Prior to Admission medications   Medication Sig Start Date End Date Taking? Authorizing Provider  acetaminophen (TYLENOL) 500 MG tablet Take 500-1,000 mg by mouth every 6 (six) hours as needed for mild pain or moderate pain.   Yes [provider]  albuterol (VENTOLIN HFA) 108 (90 Base) MCG/ACT inhaler Inhale 2 puffs into the lungs every 6 (six) hours as needed for wheezing or shortness of breath. 12/25/18  Yes Hilts, Legrand Como, MD  cetirizine (ZYRTEC) 10 MG tablet Take 10 mg by mouth daily.   Yes [provider]  Cholecalciferol (DIALYVITE VITAMIN D 5000 PO) Take 5,000 Units by mouth daily.    Yes [provider]  escitalopram (LEXAPRO) 20 MG tablet Start 1/2 tab PO qd, may increase to 1 PO qd after 1 week Patient taking differently: Take 20 mg by mouth daily.  12/25/18  Yes Hilts, Legrand Como, MD  gemfibrozil (LOPID) 600 MG tablet TAKE 1 TABLET (600 MG TOTAL) BY MOUTH 2 (TWO) TIMES DAILY BEFORE A MEAL. Patient taking differently: Take  600 mg by mouth 2 (two) times daily with a meal.  08/30/19  Yes Hilts, Legrand Como, MD  HYDROcodone-acetaminophen (NORCO/VICODIN) 5-325 MG tablet Take 1-2 tablets by mouth every 6 (six) hours as needed for severe pain. 11/05/19  Yes Quentin Cornwall, [place] N, PA-C  lisinopril (ZESTRIL) 10 MG tablet TAKE 1 TABLET BY MOUTH EVERY DAY Patient taking differently: Take 10 mg by mouth daily.  06/13/19  Yes Hilts, Legrand Como, MD  methocarbamol (ROBAXIN) 750 MG tablet Take 750 mg by mouth every 6 (six) hours as needed for muscle spasms (pain).    Yes [provider]  metolazone (ZAROXOLYN) 5 MG tablet Take 5 mg by mouth daily. 09/22/19  Yes [provider]  Multiple Vitamin (MULTIVITAMIN) tablet Take 1 tablet by mouth daily.   Yes [provider]  potassium chloride SA (KLOR-CON) 20 MEQ tablet Take 20 meq twice a day Patient taking differently: Take 20 mEq by mouth in the morning, at noon, in the evening, and at bedtime.  06/10/19  Yes Hilts, Legrand Como, MD  predniSONE (DELTASONE) 20 MG tablet Take 10 mg by mouth See admin instructions. Take 1/2 tab twice a week. 08/20/19  Yes [provider]  tacrolimus (PROGRAF) 5 MG capsule Take 5 mg by mouth 2 (two) times daily.   Yes [provider]  torsemide (DEMADEX) 100 MG tablet Take 100 mg by mouth daily.   Yes [provider]     Vitals:   11/08/19 1416 11/08/19 2138 11/09/19 0655 11/09/19 1300  BP: (!) 148/95 (!) 121/99 (!) 163/89 (!) 134/99  Pulse: (!) 107 (!) 102 100 97  Resp: _0 Temp: 98.1 F (36.7 C) 98.8 F (37.1 C) 98.2 F (36.8 C) 98.6 F (37 C)  TempSrc: Oral Oral Oral Oral  SpO2: (!) 88% 93% 92% 94%  Weight:      Height:       Exam Gen alert, no distress, calm No rash, cyanosis or gangrene Sclera anicteric, throat clear  No jvd or bruits Chest clear bilat to bases, no rales or wheezing RRR no MRG Abd soft ntnd no mass or ascites +bs GU normal male MS no joint effusions or deformity Ext 2+ diffuse pitting edema bilat lower legs/ hips/ some abd wall edema +erythema bilat MTP's, poss L knee effusion, no effusion R knee Neuro is alert, Ox 3 , nf    Home meds:  - lisinopril 10 qd/ zaroxolyn 5 qd/ torsemide 100 qd/ kcl 20 meq qid  - tacrolimus 5 bid/ prednisone 88m biw  - lopid 600 bid/ norco qid prn/ lexapro 20 qd  - prn's/ vitamins/ supplements    UA 6/3 - clear , 0-5 rbc/ wbc, rare bact  UNa 53, UCr 96    Assessment/ Plan: 1. Neph syndrome/ AKI - suspect poss due to recent start on tacrolimus which can have renal vasoconstrictive effect.  Pt has flare up of edema and alb down 1.7 c/w neph syndrome.  Hx bx-proven FSGS in Nov 2020. SP steroid course x 2, now on prograf + acei.  Have d/w pt's renal neph Dr PPosey Hamilton they were  attempting to diurese w/ zarox/ torsemide in OP setting. Plan here is to diurese w/ IV lasix +/- IV albumin, and hold prograf while diuresing given it's adverse potential effects on renal perfusion. Will hold acei as well for same reason. Will follow. .  2. Bilat leg pain - seem out of proportion to symptoms one would expect from straight forward neph syndrome/ LE  edema.  ?gout. Will check U.A.  3. Ruptured Baker's cyst / cellulitis - per primary 4. HL 5. Obesity      Kelly Splinter  MD 11/09/2019, 5:15 PM  Recent Labs  Lab 11/08/19 0503 11/09/19 0529  WBC 7.9 8.6  HGB 11.2* 11.1*   Recent Labs  Lab 11/08/19 0503 11/09/19 0529  K 4.4 4.4  BUN 64* 70*  CREATININE 2.00* 2.04*  CALCIUM 7.9* 7.8*

## 2019-11-10 ENCOUNTER — Inpatient Hospital Stay (HOSPITAL_COMMUNITY): Payer: BC Managed Care – PPO

## 2019-11-10 DIAGNOSIS — F320 Major depressive disorder, single episode, mild: Secondary | ICD-10-CM

## 2019-11-10 LAB — CBC
HCT: 30.4 % — ABNORMAL LOW (ref 39.0–52.0)
Hemoglobin: 10 g/dL — ABNORMAL LOW (ref 13.0–17.0)
MCH: 30.9 pg (ref 26.0–34.0)
MCHC: 32.9 g/dL (ref 30.0–36.0)
MCV: 93.8 fL (ref 80.0–100.0)
Platelets: 386 10*3/uL (ref 150–400)
RBC: 3.24 MIL/uL — ABNORMAL LOW (ref 4.22–5.81)
RDW: 13.2 % (ref 11.5–15.5)
WBC: 8.7 10*3/uL (ref 4.0–10.5)
nRBC: 0 % (ref 0.0–0.2)

## 2019-11-10 LAB — BASIC METABOLIC PANEL
Anion gap: 14 (ref 5–15)
BUN: 80 mg/dL — ABNORMAL HIGH (ref 6–20)
CO2: 22 mmol/L (ref 22–32)
Calcium: 8.4 mg/dL — ABNORMAL LOW (ref 8.9–10.3)
Chloride: 100 mmol/L (ref 98–111)
Creatinine, Ser: 2.54 mg/dL — ABNORMAL HIGH (ref 0.61–1.24)
GFR calc Af Amer: 31 mL/min — ABNORMAL LOW (ref >60)
GFR calc non Af Amer: 27 mL/min — ABNORMAL LOW (ref >60)
Glucose, Bld: 107 mg/dL — ABNORMAL HIGH (ref 70–99)
Potassium: 4.6 mmol/L (ref 3.5–5.1)
Sodium: 136 mmol/L (ref 135–145)

## 2019-11-10 LAB — BLOOD GAS, ARTERIAL
Acid-base deficit: 3.4 mmol/L — ABNORMAL HIGH (ref 0.0–2.0)
Bicarbonate: 21.2 mmol/L (ref 20.0–28.0)
FIO2: 21
O2 Saturation: 86.6 %
Patient temperature: 98.6
pCO2 arterial: 39.2 mmHg (ref 32.0–48.0)
pH, Arterial: 7.353 (ref 7.350–7.450)
pO2, Arterial: 58.1 mmHg — ABNORMAL LOW (ref 83.0–108.0)

## 2019-11-10 MED ORDER — NALOXONE HCL 0.4 MG/ML IJ SOLN: 0.4000 mg | INTRAMUSCULAR | Status: DC | PRN

## 2019-11-10 MED ORDER — ALLOPURINOL 300 MG PO TABS
150.0000 mg | ORAL_TABLET | Freq: Every day | ORAL | Status: DC
  Administered 2019-11-10: 150 mg via ORAL
  Filled 2019-11-10: qty 1

## 2019-11-10 MED ORDER — NALOXONE HCL 0.4 MG/ML IJ SOLN
0.4000 mg | Freq: Once | INTRAMUSCULAR | Status: AC
  Administered 2019-11-10: 0.4 mg via INTRAVENOUS

## 2019-11-10 MED ORDER — COLCHICINE 0.6 MG PO TABS
1.2000 mg | ORAL_TABLET | Freq: Once | ORAL | Status: AC
  Administered 2019-11-10: 1.2 mg via ORAL
  Filled 2019-11-10: qty 2

## 2019-11-10 MED ORDER — NALOXONE HCL 0.4 MG/ML IJ SOLN
INTRAMUSCULAR | Status: AC
  Administered 2019-11-10: 0.4 mg via INTRAVENOUS
  Filled 2019-11-10: qty 1

## 2019-11-10 MED ORDER — COLCHICINE 0.3 MG HALF TABLET
0.3000 mg | ORAL_TABLET | Freq: Every day | ORAL | Status: DC
  Administered 2019-11-11 – 2019-11-14 (×4): 0.3 mg via ORAL
  Filled 2019-11-10 (×4): qty 1

## 2019-11-10 MED ORDER — SODIUM CHLORIDE 0.9% FLUSH: 9.0000 mL | INTRAVENOUS | Status: DC | PRN

## 2019-11-10 MED ORDER — PREDNISONE 20 MG PO TABS
60.0000 mg | ORAL_TABLET | Freq: Once | ORAL | Status: AC
  Administered 2019-11-10: 60 mg via ORAL
  Filled 2019-11-10: qty 3

## 2019-11-10 MED ORDER — PREDNISONE 20 MG PO TABS
40.0000 mg | ORAL_TABLET | Freq: Every day | ORAL | Status: DC
  Administered 2019-11-11 – 2019-11-12 (×2): 40 mg via ORAL
  Filled 2019-11-10 (×2): qty 2

## 2019-11-10 NOTE — Progress Notes (Signed)
[facility] Progress Note  Subjective: UOP not great but was not all recorded. Pt feels legs are less swollen today. Creat up 2.0 > 2.5 today.   Vitals:   11/09/19 2049 11/10/19 0701 11/10/19 1326 11/10/19 1330  BP: 134/82 (!) 137/97    Pulse: (!) 106 (!) 104 78 91  Resp: 20 20 16 20   Temp: 98.4 F (36.9 C) 98.2 F (36.8 C)    TempSrc: Oral Oral    SpO2: 90% 90% (!) 84% 91%  Weight:      Height:        Exam: Gen alert, no distress, calm No jvd or bruits Chest clear bilat to bases RRR no MRG Abd soft ntnd no mass or ascites +bs GU normal male MS no joint effusions or deformity Ext 2+ diffuse pitting edema bilat lower legs/ hips/ some abd wall edema +erythema bilat MTP's, poss L knee effusion, no effusion R knee Neuro is alert, Ox 3 , nf    Home meds:  - lisinopril 10 qd/ zaroxolyn 5 qd/ torsemide 100 qd/ kcl 20 meq qid  - tacrolimus 5 bid/ prednisone 5mg  biw  - lopid 600 bid/ norco qid prn/ lexapro 20 qd  - prn's/ vitamins/ supplements    UA 6/3 - clear , 0-5 rbc/ wbc, rare bact  UNa 53, UCr 96    Assessment/ Plan: 1. Neph syndrome/ AKI - suspect poss due to recent start on tacrolimus which can have renal vasoconstrictive effect.  Pt has flare up of edema and alb down 1.7 c/w neph syndrome.  Hx bx-proven FSGS in Nov 2020. SP steroid course x 2, now on prograf + acei.  D/w pt's nephrologist Dr 8/3, plan is to attempt IV diurese, hold prograf (renal vasoconstrictor) and ACEi while diuresing. Failed attempt to diurese as OP. Creat up slightly , hopefully this will improve as prograf washes out. Cont IV lasix for now 60 bid.  2. Bilat leg pain - seem out of proportion to symptoms one would expect from straight forward neph syndrome/ LE edema.  ?gout. U.A. is high. Spoke w/ primary will try empiric colchicine / po steroids.  3. Ruptured Baker's cyst / cellulitis - per primary 4. HL 5. Obesity    Rob Orphia Mctigue 11/10/2019, 3:00 PM   Recent Labs   Lab 11/09/19 0529 11/10/19 0534  K 4.4 4.6  BUN 70* 80*  CREATININE 2.04* 2.54*  CALCIUM 7.8* 8.4*  HGB 11.1* 10.0*   Inpatient medications:  allopurinol  150 mg Oral Daily   [START ON 11/11/2019] colchicine  0.3 mg Oral Daily   colchicine  1.2 mg Oral Once   docusate sodium  100 mg Oral BID   enoxaparin (LOVENOX) injection  60 mg Subcutaneous Q24H   escitalopram  20 mg Oral Daily   furosemide  60 mg Intravenous BID   gemfibrozil  600 mg Oral BID WC   loratadine  10 mg Oral Daily   naloxone       polyethylene glycol  17 g Oral Daily   [START ON 11/11/2019] predniSONE  40 mg Oral QAC breakfast   predniSONE  60 mg Oral Once    albumin human 25 g (11/10/19 0836)    ceFAZolin (ANCEF) IV 2 g (11/10/19 1422)   acetaminophen **OR** acetaminophen, albuterol, hydrALAZINE, naLOXone (NARCAN)  injection **AND** sodium chloride flush, ondansetron **OR** ondansetron (ZOFRAN) IV, oxyCODONE-acetaminophen

## 2019-11-10 NOTE — Progress Notes (Signed)
[facility]                                                                              Patient Demographics  Darren Hamilton, is a 59 y.o. male, DOB - January 11, 1961, JDB:[id number]  Admit date - 11/07/2019   Admitting Physician Little Ishikawa, MD  Outpatient Primary MD for the patient is Eunice Blase, MD  Outpatient specialists:   LOS - 2  days   Medical records reviewed and are as summarized below:    Chief Complaint  Patient presents with  . Leg Swelling  . Foot Pain       Brief summary   Patient is a 59 year old male with history of hypertension, nephrotic syndrome (FSGS, Dr Posey Pronto, on tacrolimus), asthma, anxiety, recently evaluated for ruptured Baker's cyst in his left lower extremity posteriorly.  Over the past 72 hours prior to admission, patients pain and swelling continued to worsen, with ambulation.  He had tried treatment at home with elevation, pain medication.  Patient was evaluated by his PCP for enrollment in PT. patient for the pain is worse with weightbearing and range of motion on the left ankle.  He also reported poor p.o. intake in the last 3 days due to pain and poor ambulatory dysfunction. In ED, noted to have elevated ESR, CRP concerning for infectious process.  Doppler ultrasound left lower extremity showed ruptured Baker's cyst, no DVT.  Assessment & Plan    Principal Problem:   Cellulitis of left lower extremity, ruptured Baker's cyst, intractable pain, POA -Continue IV cefazolin, elevation of the leg.   -CT showed edema questionably cellulitis versus post Baker's cyst rupture inflammation   Active Problems: Intractable pain, recent Baker's cyst rupture left lower extremity, now right lower extremity pain, bilateral peripheral edema -Patient continues to complain of intractable pain in left lower extremity, right lower extremity. -Doppler ultrasound bilateral lower extremity negative for DVT, showed ruptured Baker's cyst in the  left - patient had been doing icing and pain control with no significant improvement, now difficulty walking with intractable pain.  Does not appear to have myositis or compartment syndrome or abscess, CT negative for any fluid collection -Patient denies any lumbar spine pain radiating to the legs, states mostly below knee to foot.  Has chronic thoracic spine occasional pain due to history of scoliosis -Seen by orthopedics, Dr. Tamera Punt on 6/5, no acute orthopedic issue explaining the pain.  CK normal, no myositis -ESR, CRP elevated on admission, uric acid 15.6 -Will trial allopurinol, colchicine loading dose today, prednisone 60 mg x 1, then 40 mg p.o. daily for 3 days.  Will assess in a.m. if improvement in the pain -Started on diuretics, on Lasix 60 mg IV every 12 hours with albumin. -Will DC IV Dilaudid as needed, patient appears to be somnolent today, gave Narcan 0.4 mg x 1 ABG with no respiratory acidosis  Acute kidney injury, has history of FSGS on immunosuppressants -Patient on tacrolimus, currently on hold -Nephrology, Dr. Melvia Heaps following, placed on IV Lasix and IV albumin, appreciate recommendations    Hyperlipidemia -Continue gemfibrozil    Essential hypertension BP is controlled, continue hydralazine IV with  parameters   Obesity Estimated body mass index is 44.31 kg/m as calculated from the following:   Height as of this encounter: 5' 8"  (1.727 m).   Weight as of this encounter: 132.2 kg.  Code Status: full  DVT Prophylaxis:  Lovenox  Family Communication: Discussed all imaging results, lab results, explained to the patient    Disposition Plan:     Status is: Inpatient  The patient will require care spanning > 2 midnights and should be moved to inpatient because: Inpatient level of care appropriate due to severity of illness.  Patient is not safe to be discharged due to difficulty ambulating (intractable pain, cellulitis left lower extremity on IV antibiotics)  Dispo:  The patient is from: Home              Anticipated d/c is to: Home              Anticipated d/c date is: 2 days              Patient currently is not medically stable to d/c.      Time Spent in minutes 35 minutes  Procedures:  Doppler ultrasound left lower extremity CT tib-fib left lower extremity  Consultants:   Orthopedics, Dr. Tamera Punt Nephrology, Dr. Jonnie Finner  Antimicrobials:   Anti-infectives (From admission, onward)   Start     Dose/Rate Route Frequency Ordered Stop   11/07/19 1445  ceFAZolin (ANCEF) IVPB 2g/100 mL premix     2 g 200 mL/hr over 30 Minutes Intravenous Every 8 hours 11/07/19 1436     11/07/19 1100  ceFAZolin (ANCEF) IVPB 1 g/50 mL premix     1 g 100 mL/hr over 30 Minutes Intravenous  Once 11/07/19 1059 11/07/19 1315         Medications  Scheduled Meds: . allopurinol  150 mg Oral Daily  . [START ON 11/11/2019] colchicine  0.3 mg Oral Daily  . colchicine  1.2 mg Oral Once  . docusate sodium  100 mg Oral BID  . enoxaparin (LOVENOX) injection  60 mg Subcutaneous Q24H  . escitalopram  20 mg Oral Daily  . furosemide  60 mg Intravenous BID  . gemfibrozil  600 mg Oral BID WC  . loratadine  10 mg Oral Daily  . naloxone      . polyethylene glycol  17 g Oral Daily  . [START ON 11/11/2019] predniSONE  40 mg Oral QAC breakfast  . predniSONE  60 mg Oral Once   Continuous Infusions: . albumin human 25 g (11/10/19 0836)  .  ceFAZolin (ANCEF) IV 2 g (11/10/19 0203)   PRN Meds:.acetaminophen **OR** acetaminophen, albuterol, hydrALAZINE, ondansetron **OR** ondansetron (ZOFRAN) IV, oxyCODONE-acetaminophen      Subjective:   Darren Hamilton was seen and examined today.  Continues to have pain in the lower extremities.  No significant back pain numbness or tingling.  Still significant peripheral edema.  Patient denies dizziness, chest pain, shortness of breath, abdominal pain, nausea or vomiting  Objective:   Vitals:   11/09/19 2049 11/10/19 0701 11/10/19  1326 11/10/19 1330  BP: 134/82 (!) 137/97    Pulse: (!) 106 (!) 104 78 91  Resp: 20 20 16 20   Temp: 98.4 F (36.9 C) 98.2 F (36.8 C)    TempSrc: Oral Oral    SpO2: 90% 90% (!) 84% 91%  Weight:      Height:        Intake/Output Summary (Last 24 hours) at 11/10/2019 1404 Last data filed at 11/10/2019  1025 Gross per 24 hour  Intake 863.6 ml  Output 125 ml  Net 738.6 ml     Wt Readings from Last 3 Encounters:  11/07/19 132.2 kg  11/05/19 130.6 kg  09/10/19 127.9 kg    Physical Exam  General: Somewhat somnolent, falling asleep in mid sentences  Cardiovascular: S1 S2 clear, RRR. No pedal edema b/l  Respiratory: CTAB, no wheezing, rales or rhonchi  Gastrointestinal: Soft, nontender, nondistended, NBS  Ext: no pedal edema bilaterally  Neuro: Able to lift up legs today, complains of pain but no focal weakness  Musculoskeletal: No cyanosis, clubbing  Skin: No rashes  Psych: Somnolent but arousable and falling asleep in midsentence    Data Reviewed:  I have personally reviewed following labs and imaging studies  Micro Results Recent Results (from the past 240 hour(s))  SARS Coronavirus 2 by RT PCR (hospital order, performed in [facility] hospital lab) Nasopharyngeal Nasopharyngeal Swab     Status: None   Collection Time: 11/07/19 10:55 AM   Specimen: Nasopharyngeal Swab  Result Value Ref Range Status   SARS Coronavirus 2 NEGATIVE NEGATIVE Final    Comment: (NOTE) SARS-CoV-2 target nucleic acids are NOT DETECTED. The SARS-CoV-2 RNA is generally detectable in upper and lower respiratory specimens during the acute phase of infection. The lowest concentration of SARS-CoV-2 viral copies this assay can detect is 250 copies / mL. A negative result does not preclude SARS-CoV-2 infection and should not be used as the sole basis for treatment or other patient management decisions.  A negative result may occur with improper specimen collection / handling, submission of  specimen other than nasopharyngeal swab, presence of viral mutation(s) within the areas targeted by this assay, and inadequate number of viral copies (<250 copies / mL). A negative result must be combined with clinical observations, patient history, and epidemiological information. Fact Sheet for Patients:   StrictlyIdeas.no Fact Sheet for Healthcare Providers: BankingDealers.co.za This test is not yet approved or cleared  by the [place] FDA and has been authorized for detection and/or diagnosis of SARS-CoV-2 by FDA under an Emergency Use Authorization (EUA).  This EUA will remain in effect (meaning this test can be used) for the duration of the COVID-19 declaration under Section 564(b)(1) of the Act, 21 U.S.C. section 360bbb-3(b)(1), unless the authorization is terminated or revoked sooner. Performed at [facility], [place] [address] [place], [place] [address]     Radiology Reports DG Tibia/Fibula Left  Result Date: 11/07/2019 CLINICAL DATA:  Leg swelling EXAM: LEFT TIBIA AND FIBULA - 2 VIEW COMPARISON:  None. FINDINGS: There is no evidence of fracture or other focal bone lesions. Diffuse soft tissue edema about the included left lower extremity. IMPRESSION: No fracture or dislocation of the left tibia or fibula. Diffuse soft tissue edema. Electronically Signed   By: Eddie Candle M.D.   On: 11/07/2019 09:32   CT TIBIA FIBULA LEFT WO CONTRAST  Result Date: 11/07/2019 CLINICAL DATA:  Left lower leg and foot pain and swelling since 11/05/2019. No known injury. EXAM: CT OF THE LOWER LEFT EXTREMITY WITHOUT CONTRAST TECHNIQUE: Multidetector CT imaging of the lower left extremity was performed according to the standard protocol. COMPARISON:  Plain films the left tibia and fibula today. FINDINGS: Bones/Joint/Cartilage No bony destructive change or periosteal reaction. No focal bony lesion. No fracture or dislocation. Imaged  joints appear normal. Ligaments Suboptimally assessed by CT. Muscles and Tendons No intramuscular fluid collection. No gas within muscle or tracking along fascial planes. There  is some fatty atrophy of lower leg musculature which is most notable in the medial gastrocnemius and likely related to disuse. Soft tissues Diffuse subcutaneous edema is present. No focal fluid collection, soft tissue gas or foreign body. IMPRESSION: Diffuse lower leg subcutaneous edema compatible with dependent change or cellulitis. The exam is otherwise negative. Electronically Signed   By: Inge Rise M.D.   On: 11/07/2019 12:22   CT FOOT LEFT WO CONTRAST  Result Date: 11/07/2019 CLINICAL DATA:  Left lower leg and foot pain and swelling since 11/05/2019. No known injury. EXAM: CT OF THE LEFT FOOT WITHOUT CONTRAST TECHNIQUE: Multidetector CT imaging of the left foot was performed according to the standard protocol. Multiplanar CT image reconstructions were also generated. COMPARISON:  None. FINDINGS: Bones/Joint/Cartilage No bony destructive change or periosteal reaction. No focal bony lesion. No fracture or dislocation. Imaged joints appear normal. Ligaments Suboptimally assessed by CT. Muscles and Tendons Appear normal. Soft tissues Subcutaneous edema is present about the dorsum of the foot. No focal fluid collection, radiopaque foreign body or soft tissue gas. IMPRESSION: Subcutaneous edema consistent with dependent change or cellulitis. The exam is otherwise negative. Electronically Signed   By: Inge Rise M.D.   On: 11/07/2019 12:24   DG Knee Complete 4 Views Left  Result Date: 11/05/2019 CLINICAL DATA:  Midthigh and calf pain swelling. : LEFT KNEE - COMPLETE 4+ VIEW COMPARISON:  No prior. FINDINGS: No acute soft tissue or bony abnormality. No evidence of fracture dislocation. IMPRESSION: No acute abnormality. Electronically Signed   By: Marcello Moores  Register   On: 11/05/2019 09:44   VAS [place] LOWER EXTREMITY VENOUS  (DVT)  Result Date: 11/08/2019  Lower Venous DVTStudy Indications: Pain.  Comparison Study: 11/07/19 Performing Technologist: Abram Sander RVS  Examination Guidelines: A complete evaluation includes B-mode imaging, spectral Doppler, color Doppler, and power Doppler as needed of all accessible portions of each vessel. Bilateral testing is considered an integral part of a complete examination. Limited examinations for reoccurring indications may be performed as noted. The reflux portion of the exam is performed with the patient in reverse Trendelenburg.  +---------+---------------+---------+-----------+----------+--------------+ RIGHT    CompressibilityPhasicitySpontaneityPropertiesThrombus Aging +---------+---------------+---------+-----------+----------+--------------+ CFV      Full           Yes      Yes                                 +---------+---------------+---------+-----------+----------+--------------+ SFJ      Full                                                        +---------+---------------+---------+-----------+----------+--------------+ FV Prox  Full                                                        +---------+---------------+---------+-----------+----------+--------------+ FV Mid   Full                                                        +---------+---------------+---------+-----------+----------+--------------+  FV DistalFull                                                        +---------+---------------+---------+-----------+----------+--------------+ PFV      Full                                                        +---------+---------------+---------+-----------+----------+--------------+ POP      Full           Yes      Yes                                 +---------+---------------+---------+-----------+----------+--------------+ PTV      Full                                                         +---------+---------------+---------+-----------+----------+--------------+ PERO     Full                                                        +---------+---------------+---------+-----------+----------+--------------+   +----+---------------+---------+-----------+----------+--------------+ LEFTCompressibilityPhasicitySpontaneityPropertiesThrombus Aging +----+---------------+---------+-----------+----------+--------------+ CFV Full           Yes      Yes                                 +----+---------------+---------+-----------+----------+--------------+     Summary: RIGHT: - There is no evidence of deep vein thrombosis in the lower extremity.  - No cystic structure found in the popliteal fossa.  LEFT: - No evidence of common femoral vein obstruction.  *See table(s) above for measurements and observations. Electronically signed by Ruta Hinds MD on 11/08/2019 at 7:41:12 PM.    Final    VAS [place] LOWER EXTREMITY VENOUS (DVT) (MC and WL 7a-7p)  Result Date: 11/07/2019  Lower Venous DVTStudy Other Indications: Diagnosed with ruptured baker's cyst 2 days ago, now with                    worsening pain/ swelling/ cannot put weight on leg. Limitations: Body habitus. Comparison Study: 11/05/19 Performing Technologist: June Leap RDMS, RVT  Examination Guidelines: A complete evaluation includes B-mode imaging, spectral Doppler, color Doppler, and power Doppler as needed of all accessible portions of each vessel. Bilateral testing is considered an integral part of a complete examination. Limited examinations for reoccurring indications may be performed as noted. The reflux portion of the exam is performed with the patient in reverse Trendelenburg.  +---------+---------------+---------+-----------+----------+--------------+ LEFT     CompressibilityPhasicitySpontaneityPropertiesThrombus Aging +---------+---------------+---------+-----------+----------+--------------+ CFV      Full            Yes      Yes                                 +---------+---------------+---------+-----------+----------+--------------+  SFJ      Full                                                        +---------+---------------+---------+-----------+----------+--------------+ FV Prox  Full                                                        +---------+---------------+---------+-----------+----------+--------------+ FV Mid   Full                                                        +---------+---------------+---------+-----------+----------+--------------+ FV DistalFull                                                        +---------+---------------+---------+-----------+----------+--------------+ PFV      Full                                                        +---------+---------------+---------+-----------+----------+--------------+ POP      Full           Yes      Yes                                 +---------+---------------+---------+-----------+----------+--------------+ PTV      Full                                                        +---------+---------------+---------+-----------+----------+--------------+ PERO     Full                                                        +---------+---------------+---------+-----------+----------+--------------+     Summary: LEFT: - There is no evidence of deep vein thrombosis in the lower extremity.  Ruptured baker's cyst. Appears mostly unchanged from previous study.  *See table(s) above for measurements and observations. Electronically signed by Monica Martinez MD on 11/07/2019 at 4:09:11 PM.    Final    VAS [place] LOWER EXTREMITY VENOUS (DVT) (ONLY MC & WL 7a-7p)  Result Date: 11/05/2019  Lower Venous DVTStudy Indications: Swelling, and Pain.  Comparison Study: 02/24/19 Performing Technologist: June Leap RDMS, RVT  Examination Guidelines: A complete evaluation includes B-mode imaging, spectral Doppler,  color Doppler, and power Doppler as needed of all accessible portions of each  vessel. Bilateral testing is considered an integral part of a complete examination. Limited examinations for reoccurring indications may be performed as noted. The reflux portion of the exam is performed with the patient in reverse Trendelenburg.  +-----+---------------+---------+-----------+----------+--------------+ RIGHTCompressibilityPhasicitySpontaneityPropertiesThrombus Aging +-----+---------------+---------+-----------+----------+--------------+ CFV                 Yes      Yes                                 +-----+---------------+---------+-----------+----------+--------------+   +---------+---------------+---------+-----------+----------+--------------+ LEFT     CompressibilityPhasicitySpontaneityPropertiesThrombus Aging +---------+---------------+---------+-----------+----------+--------------+ CFV      Full           Yes      Yes                                 +---------+---------------+---------+-----------+----------+--------------+ SFJ      Full                                                        +---------+---------------+---------+-----------+----------+--------------+ FV Prox  Full                                                        +---------+---------------+---------+-----------+----------+--------------+ FV Mid   Full                                                        +---------+---------------+---------+-----------+----------+--------------+ FV DistalFull                                                        +---------+---------------+---------+-----------+----------+--------------+ PFV      Full                                                        +---------+---------------+---------+-----------+----------+--------------+ POP      Full           Yes      Yes                                  +---------+---------------+---------+-----------+----------+--------------+ PTV      Full                                                        +---------+---------------+---------+-----------+----------+--------------+ PERO     Full                                                        +---------+---------------+---------+-----------+----------+--------------+  Summary: RIGHT: - No evidence of common femoral vein obstruction.  LEFT: - There is no evidence of deep vein thrombosis in the lower extremity.  - Fluid collection extending from popliteal fossa to proximal calf, appears to be ruptured baker's cyst.  *See table(s) above for measurements and observations. Electronically signed by Deitra Mayo MD on 11/05/2019 at 6:09:23 PM.    Final     Lab Data:  CBC: Recent Labs  Lab 11/07/19 0930 11/08/19 0503 11/09/19 0529 11/10/19 0534  WBC 9.5 7.9 8.6 8.7  NEUTROABS 7.3  --   --   --   HGB 11.9* 11.2* 11.1* 10.0*  HCT 36.1* 34.1* 33.1* 30.4*  MCV 93.0 93.9 92.7 93.8  PLT 387 364 385 103   Basic Metabolic Panel: Recent Labs  Lab 11/07/19 0930 11/08/19 0503 11/09/19 0529 11/10/19 0534  NA 136 138 134* 136  K 4.4 4.4 4.4 4.6  CL 101 102 101 100  CO2 25 27 24 22   GLUCOSE 99 101* 111* 107*  BUN 63* 64* 70* 80*  CREATININE 2.09* 2.00* 2.04* 2.54*  CALCIUM 8.0* 7.9* 7.8* 8.4*   GFR: Estimated Creatinine Clearance: 43.1 mL/min (A) (by C-G formula based on SCr of 2.54 mg/dL (H)). Liver Function Tests: Recent Labs  Lab 11/08/19 0503  AST 13*  ALT 14  ALKPHOS 49  BILITOT 0.6  PROT 5.9*  ALBUMIN 1.7*   No results for input(s): LIPASE, AMYLASE in the last 168 hours. No results for input(s): AMMONIA in the last 168 hours. Coagulation Profile: No results for input(s): INR, PROTIME in the last 168 hours. Cardiac Enzymes: Recent Labs  Lab 11/07/19 0930 11/09/19 0529  CKTOTAL 280 300   BNP (last 3 results) No results for input(s): PROBNP in the last 8760  hours. HbA1C: No results for input(s): HGBA1C in the last 72 hours. CBG: No results for input(s): GLUCAP in the last 168 hours. Lipid Profile: No results for input(s): CHOL, HDL, LDLCALC, TRIG, CHOLHDL, LDLDIRECT in the last 72 hours. Thyroid Function Tests: No results for input(s): TSH, T4TOTAL, FREET4, T3FREE, THYROIDAB in the last 72 hours. Anemia Panel: No results for input(s): VITAMINB12, FOLATE, FERRITIN, TIBC, IRON, RETICCTPCT in the last 72 hours. Urine analysis:    Component Value Date/Time   COLORURINE YELLOW 11/07/2019 1130   APPEARANCEUR CLEAR 11/07/2019 1130   APPEARANCEUR Cloudy (A) 04/01/2019 1246   LABSPEC 1.010 11/07/2019 1130   PHURINE 5.0 11/07/2019 1130   GLUCOSEU NEGATIVE 11/07/2019 1130   HGBUR NEGATIVE 11/07/2019 1130   BILIRUBINUR NEGATIVE 11/07/2019 1130   BILIRUBINUR Negative 04/01/2019 1246   KETONESUR NEGATIVE 11/07/2019 1130   PROTEINUR >=300 (A) 11/07/2019 1130   NITRITE NEGATIVE 11/07/2019 1130   LEUKOCYTESUR NEGATIVE 11/07/2019 1130     Tenna Lacko M.D. [facility] 11/10/2019, 2:04 PM   Call night coverage person covering after 7pm

## 2019-11-10 NOTE — Progress Notes (Signed)
Dr Isidoro Donning text and informed of the patient's O2 Sats ranging from 58-72%. O2 via Nasal canula was started on 3 L. Continuous pulse ox started and Dr. Isidoro Donning put in orders. Patient was Narcaned with 0.4mg  IV. His o2 Sats started to rise.Blood gases were drawn. Patient continued to fall asleep but could easily be woken up.After orders were followed, the patient now maintains O2 sat of 92-94%.

## 2019-11-10 NOTE — Progress Notes (Signed)
PT Cancellation Note  Patient Details Name: Darren Hamilton MRN: [id number] DOB: [birth year]   Cancelled Treatment:    Reason Eval/Treat Not Completed: Attempted PT tx session-pt declined to participate on today 2* fatigue. Will check back another day at pt's request.    Faye Ramsay, PT Acute Rehabilitation

## 2019-11-11 ENCOUNTER — Ambulatory Visit: Payer: BC Managed Care – PPO | Admitting: Family Medicine

## 2019-11-11 DIAGNOSIS — J9601 Acute respiratory failure with hypoxia: Secondary | ICD-10-CM | POA: Diagnosis not present

## 2019-11-11 DIAGNOSIS — N179 Acute kidney failure, unspecified: Secondary | ICD-10-CM | POA: Diagnosis not present

## 2019-11-11 DIAGNOSIS — N1831 Chronic kidney disease, stage 3a: Secondary | ICD-10-CM | POA: Diagnosis not present

## 2019-11-11 DIAGNOSIS — M660 Rupture of popliteal cyst: Secondary | ICD-10-CM

## 2019-11-11 DIAGNOSIS — L03116 Cellulitis of left lower limb: Secondary | ICD-10-CM | POA: Diagnosis not present

## 2019-11-11 DIAGNOSIS — I10 Essential (primary) hypertension: Secondary | ICD-10-CM | POA: Diagnosis not present

## 2019-11-11 DIAGNOSIS — N049 Nephrotic syndrome with unspecified morphologic changes: Secondary | ICD-10-CM

## 2019-11-11 DIAGNOSIS — F320 Major depressive disorder, single episode, mild: Secondary | ICD-10-CM | POA: Diagnosis not present

## 2019-11-11 DIAGNOSIS — R601 Generalized edema: Secondary | ICD-10-CM | POA: Diagnosis not present

## 2019-11-11 DIAGNOSIS — I129 Hypertensive chronic kidney disease with stage 1 through stage 4 chronic kidney disease, or unspecified chronic kidney disease: Secondary | ICD-10-CM | POA: Diagnosis not present

## 2019-11-11 DIAGNOSIS — E790 Hyperuricemia without signs of inflammatory arthritis and tophaceous disease: Secondary | ICD-10-CM

## 2019-11-11 LAB — CBC
HCT: 29.7 % — ABNORMAL LOW (ref 39.0–52.0)
Hemoglobin: 10.1 g/dL — ABNORMAL LOW (ref 13.0–17.0)
MCH: 30.7 pg (ref 26.0–34.0)
MCHC: 34 g/dL (ref 30.0–36.0)
MCV: 90.3 fL (ref 80.0–100.0)
Platelets: 390 10*3/uL (ref 150–400)
RBC: 3.29 MIL/uL — ABNORMAL LOW (ref 4.22–5.81)
RDW: 13.2 % (ref 11.5–15.5)
WBC: 9.1 10*3/uL (ref 4.0–10.5)
nRBC: 0 % (ref 0.0–0.2)

## 2019-11-11 LAB — BASIC METABOLIC PANEL
Anion gap: 13 (ref 5–15)
BUN: 94 mg/dL — ABNORMAL HIGH (ref 6–20)
CO2: 23 mmol/L (ref 22–32)
Calcium: 8.5 mg/dL — ABNORMAL LOW (ref 8.9–10.3)
Chloride: 98 mmol/L (ref 98–111)
Creatinine, Ser: 2.89 mg/dL — ABNORMAL HIGH (ref 0.61–1.24)
GFR calc Af Amer: 27 mL/min — ABNORMAL LOW (ref >60)
GFR calc non Af Amer: 23 mL/min — ABNORMAL LOW (ref >60)
Glucose, Bld: 160 mg/dL — ABNORMAL HIGH (ref 70–99)
Potassium: 5 mmol/L (ref 3.5–5.1)
Sodium: 134 mmol/L — ABNORMAL LOW (ref 135–145)

## 2019-11-11 MED ORDER — CEFAZOLIN SODIUM-DEXTROSE 2-4 GM/100ML-% IV SOLN
2.0000 g | Freq: Two times a day (BID) | INTRAVENOUS | Status: DC
  Administered 2019-11-11: 2 g via INTRAVENOUS
  Filled 2019-11-11 (×2): qty 100

## 2019-11-11 MED ORDER — HEPARIN SODIUM (PORCINE) 5000 UNIT/ML IJ SOLN
5000.0000 [IU] | Freq: Three times a day (TID) | INTRAMUSCULAR | Status: DC
  Administered 2019-11-11 – 2019-11-19 (×22): 5000 [IU] via SUBCUTANEOUS
  Filled 2019-11-11 (×21): qty 1

## 2019-11-11 MED ORDER — FUROSEMIDE 10 MG/ML IJ SOLN
80.0000 mg | Freq: Two times a day (BID) | INTRAMUSCULAR | Status: DC
  Administered 2019-11-11 – 2019-11-12 (×2): 80 mg via INTRAVENOUS
  Filled 2019-11-11 (×2): qty 8

## 2019-11-11 MED ORDER — ALLOPURINOL 100 MG PO TABS
50.0000 mg | ORAL_TABLET | Freq: Every day | ORAL | Status: DC
  Administered 2019-11-11: 50 mg via ORAL
  Filled 2019-11-11: qty 1

## 2019-11-11 MED ORDER — CEFDINIR 125 MG/5ML PO SUSR
300.0000 mg | Freq: Two times a day (BID) | ORAL | Status: DC
  Administered 2019-11-11 – 2019-11-14 (×6): 300 mg via ORAL
  Filled 2019-11-11 (×4): qty 15
  Filled 2019-11-11: qty 6
  Filled 2019-11-11: qty 15

## 2019-11-11 NOTE — Progress Notes (Signed)
PT Cancellation Note  Patient Details Name: Erek Kowal MRN: [id number] DOB: [birth year]   Cancelled Treatment:    Reason Eval/Treat Not Completed: (Attempted PT tx session-pt declined to participate as he worked with OT prior to PT arrival and is fatigued from standing with her. Pt would like to attempt later today if able. Will follow up at later date/time as schedule allows.)   Wynn Maudlin, DPT Physical Therapist with [facility] [phone]  11/11/2019 12:55 PM

## 2019-11-11 NOTE — Progress Notes (Addendum)
PHARMACY NOTE:  ANTIMICROBIAL RENAL DOSAGE ADJUSTMENT  Current antimicrobial regimen includes a mismatch between antimicrobial dosage and estimated renal function.  As per policy approved by the Pharmacy & Therapeutics and Medical Executive Committees, the antimicrobial dosage will be adjusted accordingly.  Current antimicrobial dosage:  Cefazolin 2 g IV q8h  Indication: Cellulitis of left lower extremity  Renal Function:  Estimated Creatinine Clearance: 37.9 mL/min (A) (by C-G formula based on SCr of 2.89 mg/dL (H)).    Antimicrobial dosage has been changed to:  Cefazolin 2 g IV q12h  Thank you for allowing pharmacy to be a part of this patient's care.  Cindi Carbon, PharmD 11/11/19 7:45 AM  Addendum: Allopurinol dose adjusted to 50 mg PO daily for CrCl 38 mL/min.  Cindi Carbon, PharmD 11/11/19 7:49 AM

## 2019-11-11 NOTE — Progress Notes (Addendum)
PROGRESS NOTE    Darren Hamilton  WYO:[id number]  DOB: [birth year]  PCP: Eunice Blase, MD 59 year old male with history of hypertension, nephrotic syndrome (FSGS, Dr Posey Pronto, on tacrolimus), asthma, anxiety, recently evaluated for ruptured Baker's cyst in his left lower extremity posteriorly.  Over the past 72 hours prior to admission, patients pain and swelling continued to worsen, with ambulation.  He had tried treatment at home with elevation, pain medication.  Patient was evaluated by his PCP for enrollment in PT. patient for the pain is worse with weightbearing and range of motion on the left ankle.  He also reported poor p.o. intake in the last 3 days due to pain and poor ambulatory dysfunction. In ED, noted to have elevated ESR, CRP concerning for infectious process.  Doppler ultrasound left lower extremity showed ruptured Baker's cyst, no DVT.   Subjective:  Patient sitting in bed and appears comfortable, on 4lits 02, saturating well.  Objective: Vitals:   11/11/19 0700 11/11/19 0828 11/11/19 1407 11/11/19 1730  BP:   139/84   Pulse:   92   Resp:   17   Temp:   98.2 F (36.8 C)   TempSrc:   Oral   SpO2:  97% 96% 97%  Weight: (!) 137.3 kg     Height:        Intake/Output Summary (Last 24 hours) at 11/11/2019 2001 Last data filed at 11/11/2019 1700 Gross per 24 hour  Intake 934 ml  Output 950 ml  Net -16 ml   Filed Weights   11/07/19 2316 11/11/19 0700  Weight: 132.2 kg (!) 137.3 kg    Physical Examination:    General exam: Appears calm and comfortable  General exam: Appears orthopneic, no resp distress but with increased O2 requirement Respiratory system: distant breath sounds, no crackles/rhonchi apprecaited Cardiovascular system: S1 & S2 heard,tachycardic,  RRR. + JVD, no murmurs, rubs, +ansarca Gastrointestinal system: Abdomen is nondistended, soft and nontender. Normal bowel sounds heard. Central nervous system: alert, oriented. No new focal neurological  deficits. Extremities:3+ pitting leg edema extending to scrotum. Skin: No rashes, lesions or ulcers Psychiatry: Judgement and insight appear intact   Data Reviewed: I have personally reviewed following labs and imaging studies  CBC: Recent Labs  Lab 11/07/19 0930 11/08/19 0503 11/09/19 0529 11/10/19 0534 11/11/19 0536  WBC 9.5 7.9 8.6 8.7 9.1  NEUTROABS 7.3  --   --   --   --   HGB 11.9* 11.2* 11.1* 10.0* 10.1*  HCT 36.1* 34.1* 33.1* 30.4* 29.7*  MCV 93.0 93.9 92.7 93.8 90.3  PLT 387 364 385 386 774   Basic Metabolic Panel: Recent Labs  Lab 11/07/19 0930 11/08/19 0503 11/09/19 0529 11/10/19 0534 11/11/19 0536  NA 136 138 134* 136 134*  K 4.4 4.4 4.4 4.6 5.0  CL 101 102 101 100 98  CO2 _0 GLUCOSE 99 101* 111* 107* 160*  BUN 63* 64* 70* 80* 94*  CREATININE 2.09* 2.00* 2.04* 2.54* 2.89*  CALCIUM 8.0* 7.9* 7.8* 8.4* 8.5*   GFR: Estimated Creatinine Clearance: 38.8 mL/min (A) (by C-G formula based on SCr of 2.89 mg/dL (H)). Liver Function Tests: Recent Labs  Lab 11/08/19 0503  AST 13*  ALT 14  ALKPHOS 49  BILITOT 0.6  PROT 5.9*  ALBUMIN 1.7*   No results for input(s): LIPASE, AMYLASE in the last 168 hours. No results for input(s): AMMONIA in the last 168 hours. Coagulation Profile: No results for input(s): INR, PROTIME in the  last 168 hours. Cardiac Enzymes: Recent Labs  Lab 11/07/19 0930 11/09/19 0529  CKTOTAL 280 300   BNP (last 3 results) No results for input(s): PROBNP in the last 8760 hours. HbA1C: No results for input(s): HGBA1C in the last 72 hours. CBG: No results for input(s): GLUCAP in the last 168 hours. Lipid Profile: No results for input(s): CHOL, HDL, LDLCALC, TRIG, CHOLHDL, LDLDIRECT in the last 72 hours. Thyroid Function Tests: No results for input(s): TSH, T4TOTAL, FREET4, T3FREE, THYROIDAB in the last 72 hours. Anemia Panel: No results for input(s): VITAMINB12, FOLATE, FERRITIN, TIBC, IRON, RETICCTPCT in the last 72  hours. Sepsis Labs: No results for input(s): PROCALCITON, LATICACIDVEN in the last 168 hours.  Recent Results (from the past 240 hour(s))  SARS Coronavirus 2 by RT PCR (hospital order, performed in [facility] hospital lab) Nasopharyngeal Nasopharyngeal Swab     Status: None   Collection Time: 11/07/19 10:55 AM   Specimen: Nasopharyngeal Swab  Result Value Ref Range Status   SARS Coronavirus 2 NEGATIVE NEGATIVE Final    Comment: (NOTE) SARS-CoV-2 target nucleic acids are NOT DETECTED. The SARS-CoV-2 RNA is generally detectable in upper and lower respiratory specimens during the acute phase of infection. The lowest concentration of SARS-CoV-2 viral copies this assay can detect is 250 copies / mL. A negative result does not preclude SARS-CoV-2 infection and should not be used as the sole basis for treatment or other patient management decisions.  A negative result may occur with improper specimen collection / handling, submission of specimen other than nasopharyngeal swab, presence of viral mutation(s) within the areas targeted by this assay, and inadequate number of viral copies (<250 copies / mL). A negative result must be combined with clinical observations, patient history, and epidemiological information. Fact Sheet for Patients:   StrictlyIdeas.no Fact Sheet for Healthcare Providers: BankingDealers.co.za This test is not yet approved or cleared  by the [place] FDA and has been authorized for detection and/or diagnosis of SARS-CoV-2 by FDA under an Emergency Use Authorization (EUA).  This EUA will remain in effect (meaning this test can be used) for the duration of the COVID-19 declaration under Section 564(b)(1) of the Act, 21 U.S.C. section 360bbb-3(b)(1), unless the authorization is terminated or revoked sooner. Performed at [facility], [place] [address], [place], [place] [address]       Radiology  Studies: DG Chest 2 View  Result Date: 11/10/2019 CLINICAL DATA:  Bilateral lower extremity edema and pain EXAM: CHEST - 2 VIEW COMPARISON:  02/24/2019 FINDINGS: Frontal and lateral views of the chest demonstrate an enlarged cardiac silhouette. There is central vascular congestion with interstitial prominence and patchy bibasilar consolidation. Small bilateral pleural effusions no pneumothorax. IMPRESSION: 1. Congestive heart failure. Electronically Signed   By: Randa Ngo M.D.   On: 11/10/2019 15:37        Scheduled Meds: . allopurinol  50 mg Oral Daily  . colchicine  0.3 mg Oral Daily  . docusate sodium  100 mg Oral BID  . escitalopram  20 mg Oral Daily  . furosemide  80 mg Intravenous BID  . gemfibrozil  600 mg Oral BID WC  . heparin injection (subcutaneous)  5,000 Units Subcutaneous Q8H  . loratadine  10 mg Oral Daily  . polyethylene glycol  17 g Oral Daily  . predniSONE  40 mg Oral QAC breakfast   Continuous Infusions: . albumin human 25 g (11/11/19 0822)  .  ceFAZolin (ANCEF) IV 2 g (11/11/19 1129)  Cellulitis of left lower extremity, ruptured Baker's cyst, intractable pain, POA -Elevated ESR, 131, CRP at the time of admission. CT showed edema questionably cellulitis versus post Baker's cyst rupture inflammation. CK within normal limits, no myositis.On IV cefazolin, elevation of the leg, not very convinced with cellulitis--change to oral abx.    Intractable pain, recent Baker's cyst rupture left lower extremity, now right lower extremity pain -Doppler ultrasound bilateral lower extremity negative for DVT, showed ruptured Baker's cyst in the left - patient had been doing icing and pain control with no significant improvement, now difficulty walking with intractable pain.  On examination, peripheral edema however patient's diuretics has been held due to AKI (was on Demadex, metolazone outpatient).  Does not appear to have myositis or compartment syndrome or abscess, CT  negative for any fluid collection.  Has chronic thoracic spine occasional pain due to history of scoliosis. Seen by  Rachel Bo, started on colchicine for gout given elevated uric acid. Patient states he can wiggle his toes better today but no c/o specific join swellings/redness.   Acute kidney injury, has history of FSGS on immunosuppressants-Creatinine has plateaued, 2.0 no improvement with IV fluids-Patient is on tacrolimus, will check with pharmacy if dosing appropriate with renal function. IV fluids discontinued, peripheral edema in legs not helping his overall situation with lower extremity pain. Case was discussed with nephrology, Dr. Jonnie Finner, currently lisinopril, Demadex, metolazone on hold, may need to be restarted due to increasing edema, will await renal recommendation  Acute hypoxic respiratory failure: due to acute diastolic CHF /volume overload due to renal failure. CXR shows central vascular congestion with interstitial prominence and patchy bibasilar consolidation. Small bilateral pleural effusions no pneumothorax. On lasix IV BID. Tper O2 as tolerated. Change IV abx to po.  Hyperlipidemia-Continue gemfibrozil  Essential hypertension-Currently metolazone, torsemide and lisinopril are on hold BP currently elevated due to pain, continue hydralazine IV with parameters  Obesity: Estimated body mass index is 44.31 kg/m as calculated from the following:   Code Status: full  DVT Prophylaxis:  Lovenox  Family Communication: Discussed all imaging results, lab results, explained to the patient    Disposition Plan:     Status is: Inpatient  The patient will require care spanning > 2 midnights and should be moved to inpatient because: Inpatient level of care appropriate due to severity of illness.  Patient is not safe to be discharged due to difficulty ambulating (intractable pain, cellulitis left lower extremity on IV antibiotics)  Dispo: The patient is from: Home   Anticipated d/c is to: Home  Anticipated d/c date is: 2 days  Patient currently is not medically stable to d/c.     LOS: 3 days    Time spent:     Guilford Shi, MD [facility] Pager in [place]  If 7PM-7AM, please contact night-coverage [email or web address] 11/11/2019, 8:01 PM

## 2019-11-11 NOTE — Progress Notes (Signed)
[facility] Progress Note  Subjective:  Creat up 2.0 > 2.5 > 2.9 today. CXR yesterday afternoon with pulmonary edema.  I/Os yeserday 1256 / 570.  Wt 6/3 132.2kg today 137.3 kg (only 2 weights recorded).  Still with LE pain but a bit improved.   Vitals:   11/10/19 2115 11/11/19 0532 11/11/19 0700 11/11/19 0828  BP: (!) 161/97 (!) 168/96    Pulse: (!) 105 90    Resp:  20    Temp: 98.4 F (36.9 C) 98.2 F (36.8 C)    TempSrc: Oral     SpO2: 93% 95%  97%  Weight:   (!) 137.3 kg   Height:        Exam: Gen alert, no distress, calm No jvd or bruits Chest normal WOB RRR no MRG MS no joint effusions or deformity Ext 2+ diffuse pitting edema bilat lower legs/ hips/ some abd wall edema +v mild erythema bilat MTP's, poss L knee effusion, no effusion R knee Neuro is alert, Ox 3 , nf    Home meds:  - lisinopril 10 qd/ zaroxolyn 5 qd/ torsemide 100 qd/ kcl 20 meq qid  - tacrolimus 5 bid/ prednisone 5mg  biw  - lopid 600 bid/ norco qid prn/ lexapro 20 qd  - prn's/ vitamins/ supplements    UA 6/3 - clear , 0-5 rbc/ wbc, rare bact  UNa 53, UCr 96    Assessment/ Plan: 1. Neph syndrome/ AKI - suspect poss due to recent start on tacrolimus which can have renal vasoconstrictive effect.  Pt has flare up of edema and alb down 1.7 c/w neph syndrome from biopsy-proven FSGS in Nov 2020. SP steroid course x 2, now on prograf + ACEi.  Dr. Dec 2020 d/w pt's nephrologist Dr Arlean Hopping, plan is to attempt IV diurese, hold prograf (renal vasoconstrictor) and ACEi while diuresing. Failed attempt to diurese as OP. Creat up slightly , hopefully this will improve as prograf washes out. Not really diuresing with lasix 60 IV BID, ^ to 80 IV BID. Receiving IV albumin 25g BID as well.  2. Bilat leg pain - seem out of proportion to symptoms one would expect from straight forward neph syndrome/ LE edema.  ?gout. U.A. is high. Trial of  empiric colchicine / po steroids.  3. Ruptured Baker's cyst /  cellulitis - per primary, on cefazolin.  4. HL 5. Obesity   Allena Katz MD [facility] Pager [phone]   Recent Labs  Lab 11/10/19 0534 11/11/19 0536  K 4.6 5.0  BUN 80* 94*  CREATININE 2.54* 2.89*  CALCIUM 8.4* 8.5*  HGB 10.0* 10.1*   Inpatient medications: . allopurinol  50 mg Oral Daily  . colchicine  0.3 mg Oral Daily  . docusate sodium  100 mg Oral BID  . escitalopram  20 mg Oral Daily  . furosemide  60 mg Intravenous BID  . gemfibrozil  600 mg Oral BID WC  . heparin injection (subcutaneous)  5,000 Units Subcutaneous Q8H  . loratadine  10 mg Oral Daily  . polyethylene glycol  17 g Oral Daily  . predniSONE  40 mg Oral QAC breakfast   . albumin human 25 g (11/11/19 0822)  .  ceFAZolin (ANCEF) IV 2 g (11/11/19 1129)   acetaminophen **OR** acetaminophen, albuterol, hydrALAZINE, naLOXone (NARCAN)  injection **AND** sodium chloride flush, ondansetron **OR** ondansetron (ZOFRAN) IV, oxyCODONE-acetaminophen

## 2019-11-11 NOTE — Progress Notes (Signed)
Occupational Therapy Treatment Patient Details Name: Darren Hamilton MRN: [id number] DOB: 1960-11-04 Today's Date: 11/11/2019    History of present illness 59 y.o. male with a history of hypertension, hyperlipidemia, asthma, anxiety, & nephrotic syndrome who returns to the ED with complaints of worsening pain/swelling to the left lower extremity since onset 05/31.  Patient was seen in the emergency department 11/05/2019 for evaluation of similar symptoms, had left knee x-ray that was negative and LLE venous duplex that was negative for DVT, however did reveal ruptured baker's cyst. He was discharged home with norco which he has been taking with mild relief. Was told to compress & elevate, has not been compressing the area but has been elevating and applying ice. He states since last visit pain seems worse in the foot and behind the knee and that the swelling may be a bit worse. He feels he cannot bear weight. Denies fever, chills, numbness, weakness, chest pain, dyspnea, recent surgery/trauma, recent long travel, hormone use, personal hx of cancer, or hx of DVT/PE.   OT comments   Attempted standing - unable due to pain  Follow Up Recommendations  Home health OT;Supervision/Assistance - 24 hour;SNF(depending on progress)    Equipment Recommendations  Tub/shower seat    Recommendations for Other Services      Precautions / Restrictions Precautions Precautions: Fall Precaution Comments: high pain/tenderness to light touch BLE; recommended legs stay elevated above heart as much as possible for edema management       Mobility Bed Mobility Overal bed mobility: Needs Assistance Bed Mobility: Supine to Sit;Sit to Supine     Supine to sit: Min assist Sit to supine: Min assist      Transfers                 General transfer comment: unable to stand. Pt was able to rest feet on floor    Balance Overall balance assessment: Needs assistance Sitting-balance support: Feet  unsupported;Bilateral upper extremity supported Sitting balance-Leahy Scale: Good Sitting balance - Comments: seated EOB, R foot only supported on floor       Standing balance comment: unable 2* pain                           ADL either performed or assessed with clinical judgement   ADL Overall ADL's : Needs assistance/impaired Eating/Feeding: Set up;Sitting   Grooming: Oral care;Wash/dry hands;Sitting                                 General ADL Comments: unable to stand due to pain.  RN present for OT session.  Attempted sit to stand 2 times     Vision Patient Visual Report: No change from baseline            Cognition Arousal/Alertness: Awake/alert Behavior During Therapy: WFL for tasks assessed/performed Overall Cognitive Status: Within Functional Limits for tasks assessed                                                     Pertinent Vitals/ Pain       Pain Score: 10-Worst pain ever Pain Location: Bilateral LEs with movement Pain Descriptors / Indicators: Grimacing;Guarding;Moaning;Crushing Pain Intervention(s): Limited activity within patient's tolerance;Monitored during session;Repositioned;Patient requesting  pain meds-RN notified  Home Living                                              Frequency  Min 2X/week        Progress Toward Goals  OT Goals(current goals can now be found in the care plan section)  Progress towards OT goals: Not progressing toward goals - comment(pain limiting)     Plan Discharge plan remains appropriate;Frequency remains appropriate       AM-PAC OT "6 Clicks" Daily Activity     Outcome Measure   Help from another person eating meals?: None Help from another person taking care of personal grooming?: A Little Help from another person toileting, which includes using toliet, bedpan, or urinal?: Total Help from another person bathing (including washing, rinsing,  drying)?: A Lot Help from another person to put on and taking off regular upper body clothing?: A Little Help from another person to put on and taking off regular lower body clothing?: Total 6 Click Score: 14    End of Session Equipment Utilized During Treatment: Rolling walker  OT Visit Diagnosis: Pain;Muscle weakness (generalized) (M62.81) Pain - part of body: Leg;Ankle and joints of foot   Activity Tolerance Patient limited by pain   Patient Left in bed;with call bell/phone within reach;with nursing/sitter in room   Nurse Communication Mobility status(condom cath off)        Time: 3295-1884 OT Time Calculation (min): 29 min  Charges: OT General Charges $OT Visit: 1 Visit OT Treatments $Self Care/Home Management : 23-37 mins  Lise Auer, OT Acute Rehabilitation Services [phone] Office- [phone]      Nikan Ellingson, Karin Golden D 11/11/2019, 1:42 PM

## 2019-11-12 ENCOUNTER — Inpatient Hospital Stay (HOSPITAL_COMMUNITY): Payer: BC Managed Care – PPO

## 2019-11-12 DIAGNOSIS — G9349 Other encephalopathy: Secondary | ICD-10-CM

## 2019-11-12 DIAGNOSIS — E785 Hyperlipidemia, unspecified: Secondary | ICD-10-CM

## 2019-11-12 DIAGNOSIS — R601 Generalized edema: Secondary | ICD-10-CM | POA: Diagnosis not present

## 2019-11-12 DIAGNOSIS — N179 Acute kidney failure, unspecified: Secondary | ICD-10-CM | POA: Diagnosis not present

## 2019-11-12 DIAGNOSIS — R918 Other nonspecific abnormal finding of lung field: Secondary | ICD-10-CM | POA: Diagnosis not present

## 2019-11-12 DIAGNOSIS — I129 Hypertensive chronic kidney disease with stage 1 through stage 4 chronic kidney disease, or unspecified chronic kidney disease: Secondary | ICD-10-CM | POA: Diagnosis not present

## 2019-11-12 DIAGNOSIS — N049 Nephrotic syndrome with unspecified morphologic changes: Secondary | ICD-10-CM | POA: Diagnosis not present

## 2019-11-12 DIAGNOSIS — J9601 Acute respiratory failure with hypoxia: Secondary | ICD-10-CM | POA: Diagnosis not present

## 2019-11-12 DIAGNOSIS — L03116 Cellulitis of left lower limb: Secondary | ICD-10-CM | POA: Diagnosis not present

## 2019-11-12 DIAGNOSIS — N1831 Chronic kidney disease, stage 3a: Secondary | ICD-10-CM | POA: Diagnosis not present

## 2019-11-12 DIAGNOSIS — N19 Unspecified kidney failure: Secondary | ICD-10-CM

## 2019-11-12 LAB — RENAL FUNCTION PANEL
Albumin: 2.6 g/dL — ABNORMAL LOW (ref 3.5–5.0)
Anion gap: 15 (ref 5–15)
BUN: 107 mg/dL — ABNORMAL HIGH (ref 6–20)
CO2: 22 mmol/L (ref 22–32)
Calcium: 8.8 mg/dL — ABNORMAL LOW (ref 8.9–10.3)
Chloride: 100 mmol/L (ref 98–111)
Creatinine, Ser: 3.16 mg/dL — ABNORMAL HIGH (ref 0.61–1.24)
GFR calc Af Amer: 24 mL/min — ABNORMAL LOW (ref >60)
GFR calc non Af Amer: 21 mL/min — ABNORMAL LOW (ref >60)
Glucose, Bld: 111 mg/dL — ABNORMAL HIGH (ref 70–99)
Phosphorus: 6.3 mg/dL — ABNORMAL HIGH (ref 2.5–4.6)
Potassium: 4.6 mmol/L (ref 3.5–5.1)
Sodium: 137 mmol/L (ref 135–145)

## 2019-11-12 LAB — BLOOD GAS, ARTERIAL
Acid-base deficit: 1 mmol/L (ref 0.0–2.0)
Bicarbonate: 23.8 mmol/L (ref 20.0–28.0)
Drawn by: 270211
FIO2: 52
O2 Saturation: 93.4 %
Patient temperature: 98.6
pCO2 arterial: 42.5 mmHg (ref 32.0–48.0)
pH, Arterial: 7.367 (ref 7.350–7.450)
pO2, Arterial: 74.3 mmHg — ABNORMAL LOW (ref 83.0–108.0)

## 2019-11-12 LAB — MRSA PCR SCREENING: MRSA by PCR: NEGATIVE

## 2019-11-12 MED ORDER — LIDOCAINE VISCOUS HCL 2 % MT SOLN
15.0000 mL | Freq: Once | OROMUCOSAL | Status: AC
  Administered 2019-11-12: 15 mL via ORAL

## 2019-11-12 MED ORDER — FUROSEMIDE 10 MG/ML IJ SOLN
40.0000 mg | Freq: Once | INTRAMUSCULAR | Status: AC
  Administered 2019-11-12: 40 mg via INTRAVENOUS

## 2019-11-12 MED ORDER — FUROSEMIDE 10 MG/ML IJ SOLN
INTRAMUSCULAR | Status: AC
  Filled 2019-11-12: qty 4

## 2019-11-12 MED ORDER — METOLAZONE 2.5 MG PO TABS
2.5000 mg | ORAL_TABLET | Freq: Once | ORAL | Status: AC
  Administered 2019-11-12: 2.5 mg via ORAL
  Filled 2019-11-12: qty 1

## 2019-11-12 MED ORDER — CHLORHEXIDINE GLUCONATE CLOTH 2 % EX PADS
6.0000 | MEDICATED_PAD | Freq: Every day | CUTANEOUS | Status: DC
  Administered 2019-11-12 – 2019-11-19 (×8): 6 via TOPICAL

## 2019-11-12 MED ORDER — ALLOPURINOL 100 MG PO TABS
50.0000 mg | ORAL_TABLET | ORAL | Status: DC
  Administered 2019-11-13: 50 mg via ORAL
  Filled 2019-11-12: qty 1

## 2019-11-12 MED ORDER — ALUM & MAG HYDROXIDE-SIMETH 200-200-20 MG/5ML PO SUSP
30.0000 mL | Freq: Once | ORAL | Status: AC
  Administered 2019-11-12: 30 mL via ORAL

## 2019-11-12 MED ORDER — PREDNISONE 5 MG PO TABS
2.5000 mg | ORAL_TABLET | Freq: Every day | ORAL | Status: DC
  Administered 2019-11-13 – 2019-11-19 (×7): 2.5 mg via ORAL
  Filled 2019-11-12 (×7): qty 1

## 2019-11-12 MED ORDER — FUROSEMIDE 10 MG/ML IJ SOLN
120.0000 mg | Freq: Two times a day (BID) | INTRAVENOUS | Status: DC
  Administered 2019-11-12 – 2019-11-14 (×4): 120 mg via INTRAVENOUS
  Filled 2019-11-12: qty 12
  Filled 2019-11-12 (×3): qty 10

## 2019-11-12 MED ORDER — GEMFIBROZIL 600 MG PO TABS
300.0000 mg | ORAL_TABLET | Freq: Every day | ORAL | Status: DC
  Administered 2019-11-13 – 2019-11-15 (×3): 300 mg via ORAL
  Filled 2019-11-12 (×3): qty 0.5

## 2019-11-12 NOTE — Progress Notes (Signed)
PROGRESS NOTE    Darren Hamilton  MVE:[id number]  DOB: Apr 15, 1961  PCP: Eunice Blase, MD 59 year old male with history of hypertension, CKD (baseline creat 1.3-1.4) with nephrotic syndrome , asthma, anxiety, recently evaluated for ruptured Baker's cyst in his left lower extremity posteriorly.  Over the past 72 hours prior to admission, patients pain and swelling continued to worsen, with ambulation.  He had tried treatment at home with elevation, pain medication.  Patient was evaluated by his PCP for enrollment in PT program but pain got worse with weightbearing and range of motion on the left ankle.  He also reported poor p.o. intake x 3 days due to pain and poor ambulatory dysfunction. In ED, noted to have elevated ESR, CRP concerning for infectious process.Patient also has minimally elevated creatinine the setting of poor p.o. intake as above.  In the ED patient received Dilaudid and cefazolin as well as 1 L normal saline.  CT Imaging of the left lower extremity reported subcutaneous edema concerning for cellulitis. Doppler ultrasound left lower extremity showed ruptured Baker's cyst, no DVT.Patient admitted with IV abx and IV fluids. Hospital course complicated by worsening renal function (Creat was up at 2.04 w/ BUN 70) prompting nephrology evaluation, anasarca in the setting of aggressive IV hydration and hypoalbuminemia. Pt has hx of neph syndrome, biopsy showed FSGS in 04/2019 -treated initially with steroids until March 2021 then started on Prograf for recurrence of symptoms.Follow Dr Posey Pronto for nephrology-attempting to diurese w/ zarox/ torsemide in OP setting . Been receiving IV lasix / albumin while here with good diuresis but still has significant anasarca and worsening pulmonary vasc congestion/02 requirement   Subjective:  Patient with resp distress this morning , RRT called. Appears drowsy, says didn't sleep well last night. Orthopneic with 02 upto 8 lits . Denies chest pain, still  very edematous. Stat CXR/ABG ordered and requested SDU bed  Objective: Vitals:   11/12/19 1027 11/12/19 1028 11/12/19 1030 11/12/19 1200  BP: (!) 140/101  (!) 140/101 (!) 157/98  Pulse:   90 91  Resp:   16 (!) 26  Temp:   98.3 F (36.8 C)   TempSrc:   Oral   SpO2:  95% 95% 93%  Weight:      Height:        Intake/Output Summary (Last 24 hours) at 11/12/2019 1223 Last data filed at 11/12/2019 1050 Gross per 24 hour  Intake --  Output 1425 ml  Net -1425 ml   Filed Weights   11/07/19 2316 11/11/19 0700  Weight: 132.2 kg (!) 137.3 kg    Physical Examination:  General exam: Appears orthopneic, mild-moderate resp distress with increased O2 requirement Respiratory system: distant breath sounds, no crackles/rhonchi apprecaited Cardiovascular system: S1 & S2 heard,tachycardic,  RRR. + JVD, no murmurs, rubs, +ansarca Gastrointestinal system: Abdomen is nondistended, soft and nontender. Normal bowel sounds heard. Central nervous system: drowsy but oriented. No new focal neurological deficits. Extremities:3+ pitting leg edema extending to scrotum. Skin: No rashes, lesions or ulcers Psychiatry: Judgement and insight appear okay but drowsy  Data Reviewed: I have personally reviewed following labs and imaging studies  CBC: Recent Labs  Lab 11/07/19 0930 11/08/19 0503 11/09/19 0529 11/10/19 0534 11/11/19 0536  WBC 9.5 7.9 8.6 8.7 9.1  NEUTROABS 7.3  --   --   --   --   HGB 11.9* 11.2* 11.1* 10.0* 10.1*  HCT 36.1* 34.1* 33.1* 30.4* 29.7*  MCV 93.0 93.9 92.7 93.8 90.3  PLT 387 364 385 386 390  Basic Metabolic Panel: Recent Labs  Lab 11/08/19 0503 11/09/19 0529 11/10/19 0534 11/11/19 0536 11/12/19 0729  NA 138 134* 136 134* 137  K 4.4 4.4 4.6 5.0 4.6  CL 102 101 100 98 100  CO2 27 24 22 23 22   GLUCOSE 101* 111* 107* 160* 111*  BUN 64* 70* 80* 94* 107*  CREATININE 2.00* 2.04* 2.54* 2.89* 3.16*  CALCIUM 7.9* 7.8* 8.4* 8.5* 8.8*  PHOS  --   --   --   --  6.3*    GFR: Estimated Creatinine Clearance: 35.4 mL/min (A) (by C-G formula based on SCr of 3.16 mg/dL (H)). Liver Function Tests: Recent Labs  Lab 11/08/19 0503 11/12/19 0729  AST 13*  --   ALT 14  --   ALKPHOS 49  --   BILITOT 0.6  --   PROT 5.9*  --   ALBUMIN 1.7* 2.6*   No results for input(s): LIPASE, AMYLASE in the last 168 hours. No results for input(s): AMMONIA in the last 168 hours. Coagulation Profile: No results for input(s): INR, PROTIME in the last 168 hours. Cardiac Enzymes: Recent Labs  Lab 11/07/19 0930 11/09/19 0529  CKTOTAL 280 300   BNP (last 3 results) No results for input(s): PROBNP in the last 8760 hours. HbA1C: No results for input(s): HGBA1C in the last 72 hours. CBG: No results for input(s): GLUCAP in the last 168 hours. Lipid Profile: No results for input(s): CHOL, HDL, LDLCALC, TRIG, CHOLHDL, LDLDIRECT in the last 72 hours. Thyroid Function Tests: No results for input(s): TSH, T4TOTAL, FREET4, T3FREE, THYROIDAB in the last 72 hours. Anemia Panel: No results for input(s): VITAMINB12, FOLATE, FERRITIN, TIBC, IRON, RETICCTPCT in the last 72 hours. Sepsis Labs: No results for input(s): PROCALCITON, LATICACIDVEN in the last 168 hours.  Recent Results (from the past 240 hour(s))  SARS Coronavirus 2 by RT PCR (hospital order, performed in [facility] hospital lab) Nasopharyngeal Nasopharyngeal Swab     Status: None   Collection Time: 11/07/19 10:55 AM   Specimen: Nasopharyngeal Swab  Result Value Ref Range Status   SARS Coronavirus 2 NEGATIVE NEGATIVE Final    Comment: (NOTE) SARS-CoV-2 target nucleic acids are NOT DETECTED. The SARS-CoV-2 RNA is generally detectable in upper and lower respiratory specimens during the acute phase of infection. The lowest concentration of SARS-CoV-2 viral copies this assay can detect is 250 copies / mL. A negative result does not preclude SARS-CoV-2 infection and should not be used as the sole basis for treatment or  other patient management decisions.  A negative result may occur with improper specimen collection / handling, submission of specimen other than nasopharyngeal swab, presence of viral mutation(s) within the areas targeted by this assay, and inadequate number of viral copies (<250 copies / mL). A negative result must be combined with clinical observations, patient history, and epidemiological information. Fact Sheet for Patients:   StrictlyIdeas.no Fact Sheet for Healthcare Providers: BankingDealers.co.za This test is not yet approved or cleared  by the [place] FDA and has been authorized for detection and/or diagnosis of SARS-CoV-2 by FDA under an Emergency Use Authorization (EUA).  This EUA will remain in effect (meaning this test can be used) for the duration of the COVID-19 declaration under Section 564(b)(1) of the Act, 21 U.S.C. section 360bbb-3(b)(1), unless the authorization is terminated or revoked sooner. Performed at [facility] [place] [address] [place], [place] [address]       Radiology Studies: DG Chest 2 View  Result Date: 11/10/2019  CLINICAL DATA:  Bilateral lower extremity edema and pain EXAM: CHEST - 2 VIEW COMPARISON:  02/24/2019 FINDINGS: Frontal and lateral views of the chest demonstrate an enlarged cardiac silhouette. There is central vascular congestion with interstitial prominence and patchy bibasilar consolidation. Small bilateral pleural effusions no pneumothorax. IMPRESSION: 1. Congestive heart failure. Electronically Signed   By: Randa Ngo M.D.   On: 11/10/2019 15:37   DG CHEST PORT 1 VIEW  Result Date: 11/12/2019 CLINICAL DATA:  Rapid response.  SOB. EXAM: PORTABLE CHEST 1 VIEW COMPARISON:  None. FINDINGS: Cardiac enlargement. Decreased lung volumes. New bilateral pulmonary opacities are identified particularly within the right upper lobe. Imaging findings may reflect areas of  asymmetric alveolar edema or infection. IMPRESSION: New bilateral pulmonary opacities compatible with asymmetric alveolar edema versus multifocal infection. Electronically Signed   By: Kerby Moors M.D.   On: 11/12/2019 11:03        Scheduled Meds: . [START ON 11/13/2019] allopurinol  50 mg Oral QODAY  . cefdinir  300 mg Oral BID  . Chlorhexidine Gluconate Cloth  6 each Topical Daily  . colchicine  0.3 mg Oral Daily  . docusate sodium  100 mg Oral BID  . escitalopram  20 mg Oral Daily  . furosemide      . furosemide  80 mg Intravenous BID  . [START ON 11/13/2019] gemfibrozil  300 mg Oral Q breakfast  . heparin injection (subcutaneous)  5,000 Units Subcutaneous Q8H  . loratadine  10 mg Oral Daily  . polyethylene glycol  17 g Oral Daily  . predniSONE  40 mg Oral QAC breakfast   Continuous Infusions:  Assessment/plan:   Acute hypoxic respiratory failure: due to acute diastolic CHF /volume overload due to renal failure. CXR shows central vascular congestion with interstitial prominence and patchy bibasilar consolidation. Small bilateral pleural effusions no pneumothorax. On lasix 80 mg  IV BID with albumin. Gave additional 77m IV this morning and Stat CXR consistent with pulmonary edema. ABG shows hypoxia, CO2 okay. Will consult pulmonary if resp status remains tenuous through the day.   Altered mental status/metabolic encephalopathy: appears drowsy, could be related to uremia vs lack of sleep last night.  Steroid induced uremia vs AKI related. D/W renal regarding need for dialysis--they will f/u.   Acute kidney injury, has history of FSGS on immunosuppressants-Creatinine initially plateued at 2.0 but peaked to 3.1 today in the setting of diuresis.  IV fluids discontinued in concern for worsening peripheral edema in legs. Nephrology following-case discussed with Dr KJohnney Ouwho will evaluate atient for either increase to lasix dosage vs lasix drip vs contemplate dialysis-currently lisinopril,  Demadex, metolazone on hold, may need to be restarted due to increasing edema in spite of IV lasix/albumin, will await renal recommendation  Cellulitis of left lower extremity, recent ruptured Baker's cyst, intractable pain, POA-Elevated ESR, 131, CRP at the time of admission. CT showed edema questionably cellulitis versus post Baker's cyst rupture inflammation. CK within normal limits, no myositis . Was On IV cefazolin, not very convinced about cellulitis--changed to oral abx on 6/7 to avoid fluid overload.  Intractable pain, recent Baker's cyst rupture left lower extremity, now right lower extremity pain -Doppler ultrasound bilateral lower extremity negative for DVT, showed ruptured Baker's cyst in the left - patient had been doing icing and pain control with no significant improvement, reports difficulty walking with intractable pain.  On examination, peripheral edema however patient's diuretics has been held due to AKI (was on Demadex, metolazone outpatient).  Does not appear  to have myositis or compartment syndrome or abscess, CT negative for any fluid collection.  Has chronic thoracic spine occasional pain due to history of scoliosis. Seen by  Rachel Bo, started on colchicine/allopurinol and prednisone for gout given elevated uric acid ( in the setting of diuretics)--will hold prednisone in concern for worsening uremia as no c/o specific join swellings/redness today.   Hyperlipidemia-Continue gemfibrozil  Essential hypertension-Currently metolazone, torsemide and lisinopril are on hold BP currently elevated due to pain, continue hydralazine IV with parameters  Obesity: Estimated body mass index is 44.31 kg/m as calculated from the following:   Code Status: full  DVT Prophylaxis:  Lovenox  Family Communication: Discussed all imaging results, lab results, explained to the patient earlier today. Updated wife, Sharee Pimple over the phone-she appreciated the care and reinterated that he is full  code.   Disposition Plan:     Status is: Inpatient  The patient will require care spanning > 2 midnights and should be moved to inpatient because: Inpatient level of care appropriate due to severity of illness.  Patient is not safe to be discharged due ongoing IV diuresis, critical state. Transferred to SDU  Dispo: The patient is from: Home  Anticipated d/c is to: Home  Anticipated d/c date is: 4-5 days  Patient currently is not medically stable to d/c.     LOS: 4 days    Critical care Time spent: 45 minutes    Guilford Shi, MD [facility] Pager in [place]  If 7PM-7AM, please contact night-coverage [email or web address] 11/12/2019, 12:23 PM

## 2019-11-12 NOTE — Progress Notes (Signed)
Pharmacy Brief Note:  Based on CrCl 35 mL/min, eGFR of 21 mL/min, adjusted dose of allopurinol from 50 mg PO daily to 50 mg PO every other day.   Lenis Noon, PharmD 11/12/19 9:38 AM

## 2019-11-12 NOTE — Progress Notes (Addendum)
[facility] Progress Note  Subjective:  Creat cont to trend up to 3.16 today.  ^ O2 requirement up to 8L this AM, CXR with asymmetric edema vs infection.  Moved to SDU.   Afebrile with WBC 9.1.  I/Os yesterday net neg 1.2L  Still with LE pain but a bit improved.  Having hiccups, food tastes bland, no emesis.    Vitals:   11/12/19 1027 11/12/19 1028 11/12/19 1030 11/12/19 1200  BP: (!) 140/101  (!) 140/101 (!) 157/98  Pulse:   90 91  Resp:   16 (!) 26  Temp:   98.3 F (36.8 C) 98.1 F (36.7 C)  TempSrc:   Oral Oral  SpO2:  95% 95% 93%  Weight:      Height:        Exam: Gen sleepy but arousable and coherent Lungs: slightly ^ WOB, dec BS bases, rales in mid fields, on 6L [place] with sats mid 90s CV: RRR, no rub MS no joint effusions or deformity Ext 2+ diffuse pitting edema bilat lower legs/ hips/ some abd wall edema - about the same as yesterday. Neuro is alert, Ox 3 , nf    Home meds:  - lisinopril 10 qd/ zaroxolyn 5 qd/ torsemide 100 qd/ kcl 20 meq qid  - tacrolimus 5 bid/ prednisone 5mg  biw  - lopid 600 bid/ norco qid prn/ lexapro 20 qd  - prn's/ vitamins/ supplements    UA 6/3 - clear , 0-5 rbc/ wbc, rare bact  UNa 53, UCr 96    Assessment/ Plan: 1. Nephrotic syndrome secondary to FSGS (bx 40/9811) complicated by AKI:  suspect this is hemodynamically mediated injury with resultant ATN - low oncotic pressure, poor po intake/hypovolemia, ACEi, tacrolimus.  Cr cont to rise but he's non oliguric.  I think the steroids are a large contribution to his rising BUN, however he seems to be developing some early uremic symptoms and I am concerned he may need dialysis in the next 24-48h.  Will follow closely.   2. Volume overload: worsening symptoms today - CXR asymmetric edema vs infection, favor the former given afebrile and no leukocytosis.  Hold albumin, ^ lasix 120 BID + metolazone 2.5mg  now.    3. Bilat leg pain - seem out of proportion to symptoms one would  expect from straight forward neph syndrome/ LE edema.  ?gout. U.A. is high. Trial of  empiric colchicine / po steroids.  4. Ruptured Baker's cyst / cellulitis - per primary, on cefazolin.  5. HL 6. Obesity 7.  HTN:  BPs in the 140-160, was on lisinopril 10 daily at home, follow with diuresis, may need antiHTN agent.   Call with clinical status changes.    Jannifer Hick MD [facility] Pager [phone]   Recent Labs  Lab 11/10/19 [phone] 11/10/19 0534 11/11/19 0536 11/12/19 0729  K 4.6   < > 5.0 4.6  BUN 80*   < > 94* 107*  CREATININE 2.54*   < > 2.89* 3.16*  CALCIUM 8.4*   < > 8.5* 8.8*  PHOS  --   --   --  6.3*  HGB 10.0*  --  10.1*  --    < > = values in this interval not displayed.   Inpatient medications: . [START ON 11/13/2019] allopurinol  50 mg Oral QODAY  . cefdinir  300 mg Oral BID  . Chlorhexidine Gluconate Cloth  6 each Topical Daily  . colchicine  0.3 mg Oral Daily  . docusate sodium  100 mg Oral BID  . escitalopram  20 mg Oral Daily  . furosemide      . [START ON 11/13/2019] gemfibrozil  300 mg Oral Q breakfast  . heparin injection (subcutaneous)  5,000 Units Subcutaneous Q8H  . loratadine  10 mg Oral Daily  . metolazone  2.5 mg Oral Once  . polyethylene glycol  17 g Oral Daily   . furosemide     acetaminophen **OR** acetaminophen, albuterol, hydrALAZINE, naLOXone (NARCAN)  injection **AND** sodium chloride flush, ondansetron **OR** ondansetron (ZOFRAN) IV, oxyCODONE-acetaminophen

## 2019-11-12 NOTE — Progress Notes (Signed)
PT Cancellation Note  Patient Details Name: Darren Hamilton MRN: [id number] DOB: [birth year]   Cancelled Treatment:    Reason Eval/Treat Not Completed: Other (comment);Patient's level of consciousness(Pt transferred to ICU stepdown unit today due to increased respiratory requirements. Pt is currently resting in bed, RN feels we should hold until he has rested and is more alert. Will follow up at later date/time as schedule allows.)   Wynn Maudlin, DPT Physical Therapist with [facility] [phone]  11/12/2019 1:50 PM

## 2019-11-13 LAB — PROTEIN / CREATININE RATIO, URINE
Creatinine, Urine: 45.94 mg/dL
Protein Creatinine Ratio: 1.7 mg/mg{Cre} — ABNORMAL HIGH (ref 0.00–0.15)
Total Protein, Urine: 78 mg/dL

## 2019-11-13 LAB — RENAL FUNCTION PANEL
Albumin: 2.7 g/dL — ABNORMAL LOW (ref 3.5–5.0)
Anion gap: 12 (ref 5–15)
BUN: 97 mg/dL — ABNORMAL HIGH (ref 6–20)
CO2: 26 mmol/L (ref 22–32)
Calcium: 8.7 mg/dL — ABNORMAL LOW (ref 8.9–10.3)
Chloride: 100 mmol/L (ref 98–111)
Creatinine, Ser: 2.44 mg/dL — ABNORMAL HIGH (ref 0.61–1.24)
GFR calc Af Amer: 33 mL/min — ABNORMAL LOW (ref >60)
GFR calc non Af Amer: 28 mL/min — ABNORMAL LOW (ref >60)
Glucose, Bld: 105 mg/dL — ABNORMAL HIGH (ref 70–99)
Phosphorus: 4.9 mg/dL — ABNORMAL HIGH (ref 2.5–4.6)
Potassium: 4.3 mmol/L (ref 3.5–5.1)
Sodium: 138 mmol/L (ref 135–145)

## 2019-11-13 MED ORDER — LABETALOL HCL 5 MG/ML IV SOLN
10.0000 mg | INTRAVENOUS | Status: DC | PRN
  Administered 2019-11-14 – 2019-11-17 (×4): 10 mg via INTRAVENOUS
  Filled 2019-11-13 (×5): qty 4

## 2019-11-13 NOTE — Progress Notes (Addendum)
PROGRESS NOTE    Darren Hamilton  OFH:[id number] DOB: [birth year] DOA: 11/07/2019 PCP: Eunice Blase, MD   Brief Narrative:  Patient is a 59 year old male with history of hypertension, CKD stage III with nephrotic syndrome, asthma, anxiety,recent  history of ruptured Baker's cyst in his left lower extremity who presented with complaints of pain and swelling on the left lower extremity which worsened with ambulation.  Patient was evaluated by his PCP for enrollment in PT program but pain got worse with weightbearing and range of motion on the left ankle.  He also reported poor oral intake for 3 days due to pain and poor ambulatory dysfunction.  In the emergency department, he was found to have elevated ESR, CRP concerning for infectious process.  His CT imaging of the left lower extremity showed subcutaneous edema concerning for cellulitis.  Venous duplex of the left lower extremity showed ruptured Baker's cyst, no DVT.  Patient was admitted for the management of cellulitis secondary to ruptured Baker's cyst.  Hospital course complicated by worsening renal function prompted nephrology evaluation.  He also has developed anasarca in the setting of aggressive IV hydration and hypoalbuminemia.  He has been started on IV Lasix/albumin but he still has significant anasarca and worsening pulmonary congestion requiring oxygen supplementation.   Assessment & Plan:   Principal Problem:   Cellulitis of left lower extremity without foot Active Problems:   Depression   Seasonal allergies   Hyperlipidemia   Essential hypertension   Nephrotic syndrome   Cellulitis   Acute hypoxic respiratory failure: Secondary to acute diastolic congestive heart failure, volume overload due to renal failure.  Chest x-ray showed vascular congestion with interstitial prominence and patchy bibasilar consolidation, small bilateral pleural effusions.  Currently on Lasix IV with albumin.  ABG showed hypoxia, normal CO2.   Continue monitor respiratory status.  This morning he was on 3 L of oxygen per minute.  Altered mental status/metabolic encephalopathy: Appeared drowsy, sleepy which could be related to uremia versus lack of sleep so he was transferred to stepdown.  This morning he is alert and oriented.  He states the chang in the mental status  yesterday could be from lack of sleep.  Continue to monitor mental status.  AKI on CKD stage IIIa: Has history of nephrotic syndrome.  Biopsy showed FSGS in 04/2019.  He is on Prograf at home.  He follows with Dr. Posey Pronto, nephrology.  Nephrology following here.  Currently on Lasix 80 mg twice daily with albumin lisinopril, Demadex, metoprolol on hold.  Cellulitis of the left lower extremity/ruptured Baker's cyst: Presented with left lower extremity pain, swelling mainly on foot.  Elevated ESR, CRP.  CT showed edema suggesting cellulitis versus post Baker's cyst rupture inflammation.  Antibiotics changed to oral.  There is no findings of myositis or compartment syndrome or abscess.  Intractable pain: Suspected to be from ruptured Baker's cyst.  Doppler ultrasound negative for DVT.  He was complaining of left lower extremity pain on presentation but now developed right lower extremity pain.  He complains of pain on the right foot.  Seen by Ortho.  Found to have elevated uric acid level.  Started on colchicine, I will dc allopurinol because its contraindicated in acute gout.  Continue pain management  Hyperlipidemia: On gemfibrozil  Hypertension: Blood pressure better this morning but is still hypertensive.  Continue current medications  Obesity: BMI of 44.3.         DVT prophylaxis: Subcu heparin Code Status: Full code Family Communication: None present  at the bedside Status is: Inpatient  Remains inpatient appropriate because:IV treatments appropriate due to intensity of illness or inability to take PO   Dispo: The patient is from: Home              Anticipated  d/c is to: Home              Anticipated d/c date is: 3 days              Patient currently is not medically stable to d/c.    Consultants: Nephrology, PCCM  Procedures: None  Antimicrobials:  Anti-infectives (From admission, onward)   Start     Dose/Rate Route Frequency Ordered Stop   11/11/19 2200  cefdinir (OMNICEF) 125 MG/5ML suspension 300 mg     300 mg Oral 2 times daily 11/11/19 2007     11/11/19 1000  ceFAZolin (ANCEF) IVPB 2g/100 mL premix  Status:  Discontinued     2 g 200 mL/hr over 30 Minutes Intravenous Every 12 hours 11/11/19 0742 11/11/19 2007   11/07/19 1445  ceFAZolin (ANCEF) IVPB 2g/100 mL premix  Status:  Discontinued     2 g 200 mL/hr over 30 Minutes Intravenous Every 8 hours 11/07/19 1436 11/11/19 0742   11/07/19 1100  ceFAZolin (ANCEF) IVPB 1 g/50 mL premix     1 g 100 mL/hr over 30 Minutes Intravenous  Once 11/07/19 1059 11/07/19 1315      Subjective: Patient seen and examined at bedside this morning.  Hemodynamically stable.  Today he is more  alert and oriented ,sleeping .  He says the pain is better today.  Still looks severely volume overloaded  Objective: Vitals:   11/13/19 0300 11/13/19 0400 11/13/19 0700 11/13/19 0800  BP: (!) 150/102 (!) 147/103 (!) 166/100 (!) 179/112  Pulse: 86 91 90 92  Resp: (!) 26 20 (!) 22 (!) 23  Temp:  98.6 F (37 C)  98.3 F (36.8 C)  TempSrc:  Axillary  Axillary  SpO2: 93% 94% 94% 92%  Weight:      Height:        Intake/Output Summary (Last 24 hours) at 11/13/2019 0836 Last data filed at 11/13/2019 0500 Gross per 24 hour  Intake 54.15 ml  Output 2550 ml  Net -2495.85 ml   Filed Weights   11/07/19 2316 11/11/19 0700  Weight: 132.2 kg (!) 137.3 kg    Examination:  General exam: sleepy, not in distress, morbidly obese HEENT:PERRL,Oral mucosa moist, Ear/Nose normal on gross exam Respiratory system: Bilateral equal air entry, normal vesicular breath sounds, no wheezes or crackles  Cardiovascular system: S1  & S2 heard, RRR. No JVD, murmurs, rubs, gallops or clicks.2-3+ pedal edema. Gastrointestinal system: Abdomen is nondistended, soft and nontender. No organomegaly or masses felt. Normal bowel sounds heard. Central nervous system: Alert and oriented. No focal neurological deficits. Extremities:2-3+ bilateral lower extremity edema, no clubbing ,no cyanosis, tenderness of bilateral ankles and foot Skin: No rashes, lesions or ulcers,no icterus ,no pallor   Data Reviewed: I have personally reviewed following labs and imaging studies  CBC: Recent Labs  Lab 11/07/19 0930 11/08/19 0503 11/09/19 0529 11/10/19 0534 11/11/19 0536  WBC 9.5 7.9 8.6 8.7 9.1  NEUTROABS 7.3  --   --   --   --   HGB 11.9* 11.2* 11.1* 10.0* 10.1*  HCT 36.1* 34.1* 33.1* 30.4* 29.7*  MCV 93.0 93.9 92.7 93.8 90.3  PLT 387 364 385 386 324   Basic Metabolic Panel: Recent Labs  Lab  11/09/19 0529 11/10/19 0534 11/11/19 0536 11/12/19 0729 11/13/19 0648  NA 134* 136 134* 137 138  K 4.4 4.6 5.0 4.6 4.3  CL 101 100 98 100 100  CO2 24 22 23 22 26   GLUCOSE 111* 107* 160* 111* 105*  BUN 70* 80* 94* 107* 97*  CREATININE 2.04* 2.54* 2.89* 3.16* 2.44*  CALCIUM 7.8* 8.4* 8.5* 8.8* 8.7*  PHOS  --   --   --  6.3* 4.9*   GFR: Estimated Creatinine Clearance: 45.9 mL/min (A) (by C-G formula based on SCr of 2.44 mg/dL (H)). Liver Function Tests: Recent Labs  Lab 11/08/19 0503 11/12/19 0729 11/13/19 0648  AST 13*  --   --   ALT 14  --   --   ALKPHOS 49  --   --   BILITOT 0.6  --   --   PROT 5.9*  --   --   ALBUMIN 1.7* 2.6* 2.7*   No results for input(s): LIPASE, AMYLASE in the last 168 hours. No results for input(s): AMMONIA in the last 168 hours. Coagulation Profile: No results for input(s): INR, PROTIME in the last 168 hours. Cardiac Enzymes: Recent Labs  Lab 11/07/19 0930 11/09/19 0529  CKTOTAL 280 300   BNP (last 3 results) No results for input(s): PROBNP in the last 8760 hours. HbA1C: No results for  input(s): HGBA1C in the last 72 hours. CBG: No results for input(s): GLUCAP in the last 168 hours. Lipid Profile: No results for input(s): CHOL, HDL, LDLCALC, TRIG, CHOLHDL, LDLDIRECT in the last 72 hours. Thyroid Function Tests: No results for input(s): TSH, T4TOTAL, FREET4, T3FREE, THYROIDAB in the last 72 hours. Anemia Panel: No results for input(s): VITAMINB12, FOLATE, FERRITIN, TIBC, IRON, RETICCTPCT in the last 72 hours. Sepsis Labs: No results for input(s): PROCALCITON, LATICACIDVEN in the last 168 hours.  Recent Results (from the past 240 hour(s))  SARS Coronavirus 2 by RT PCR (hospital order, performed in [facility] hospital lab) Nasopharyngeal Nasopharyngeal Swab     Status: None   Collection Time: 11/07/19 10:55 AM   Specimen: Nasopharyngeal Swab  Result Value Ref Range Status   SARS Coronavirus 2 NEGATIVE NEGATIVE Final    Comment: (NOTE) SARS-CoV-2 target nucleic acids are NOT DETECTED. The SARS-CoV-2 RNA is generally detectable in upper and lower respiratory specimens during the acute phase of infection. The lowest concentration of SARS-CoV-2 viral copies this assay can detect is 250 copies / mL. A negative result does not preclude SARS-CoV-2 infection and should not be used as the sole basis for treatment or other patient management decisions.  A negative result may occur with improper specimen collection / handling, submission of specimen other than nasopharyngeal swab, presence of viral mutation(s) within the areas targeted by this assay, and inadequate number of viral copies (<250 copies / mL). A negative result must be combined with clinical observations, patient history, and epidemiological information. Fact Sheet for Patients:   StrictlyIdeas.no Fact Sheet for Healthcare Providers: BankingDealers.co.za This test is not yet approved or cleared  by the [place] FDA and has been authorized for detection and/or  diagnosis of SARS-CoV-2 by FDA under an Emergency Use Authorization (EUA).  This EUA will remain in effect (meaning this test can be used) for the duration of the COVID-19 declaration under Section 564(b)(1) of the Act, 21 U.S.C. section 360bbb-3(b)(1), unless the authorization is terminated or revoked sooner. Performed at [facility], [place] [address], [place], [place] [address]   MRSA PCR Screening  Status: None   Collection Time: 11/12/19 11:25 AM   Specimen: Nasal Mucosa; Nasopharyngeal  Result Value Ref Range Status   MRSA by PCR NEGATIVE NEGATIVE Final    Comment:        The GeneXpert MRSA Assay (FDA approved for NASAL specimens only), is one component of a comprehensive MRSA colonization surveillance program. It is not intended to diagnose MRSA infection nor to guide or monitor treatment for MRSA infections. Performed at [facility], [place] [address] [place], [place] [address]          Radiology Studies: DG CHEST PORT 1 VIEW  Result Date: 11/12/2019 CLINICAL DATA:  Rapid response.  SOB. EXAM: PORTABLE CHEST 1 VIEW COMPARISON:  None. FINDINGS: Cardiac enlargement. Decreased lung volumes. New bilateral pulmonary opacities are identified particularly within the right upper lobe. Imaging findings may reflect areas of asymmetric alveolar edema or infection. IMPRESSION: New bilateral pulmonary opacities compatible with asymmetric alveolar edema versus multifocal infection. Electronically Signed   By: Kerby Moors M.D.   On: 11/12/2019 11:03        Scheduled Meds: . allopurinol  50 mg Oral QODAY  . cefdinir  300 mg Oral BID  . Chlorhexidine Gluconate Cloth  6 each Topical Daily  . colchicine  0.3 mg Oral Daily  . docusate sodium  100 mg Oral BID  . escitalopram  20 mg Oral Daily  . gemfibrozil  300 mg Oral Q breakfast  . heparin injection (subcutaneous)  5,000 Units Subcutaneous Q8H  . loratadine  10 mg Oral Daily  .  polyethylene glycol  17 g Oral Daily  . predniSONE  2.5 mg Oral Q breakfast   Continuous Infusions: . furosemide Stopped (11/12/19 1627)     LOS: 5 days    Time spent:35 mins., More than 50% of that time was spent in counseling and/or coordination of care.      Shelly Coss, MD [facility] P6/02/2020, 8:36 AM

## 2019-11-13 NOTE — Progress Notes (Signed)
[facility] Progress Note  Subjective:  Good diuresis yesterday, down to 3L [place].  Feeling some improvement in pain and breathing this AM.  Cr improved as well.  No dysgeusia, no hiccups.  Vitals:   11/13/19 0900 11/13/19 1000 11/13/19 1100 11/13/19 1200  BP:  (!) 150/93 136/84 (!) 148/82  Pulse: (!) 101 (!) 105 95 99  Resp: 16 15 17 11   Temp:    (!) 97.5 F (36.4 C)  TempSrc:    Oral  SpO2: 91% 93% 94% 95%  Weight:      Height:        Exam: Gen much more alert than yesterday Lungs: normal WOB today, no rales appreciated, 97% on 3L while conversating CV: RRR, no rub MS no joint effusions or deformity Ext 2+ diffuse pitting edema bilat lower legs (L > R)/ hips/ some abd wall edema - about the same as yesterday despite diuresis Neuro is alert, Ox 3 , nf    Home meds:  - lisinopril 10 qd/ zaroxolyn 5 qd/ torsemide 100 qd/ kcl 20 meq qid  - tacrolimus 5 bid/ prednisone 5mg  biw  - lopid 600 bid/ norco qid prn/ lexapro 20 qd  - prn's/ vitamins/ supplements    UA 6/3 - clear , 0-5 rbc/ wbc, rare bact  UNa 53, UCr 96    Assessment/ Plan: 1. 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 Thankfully renal function is improved today.  Will continue to avoid nephrotoxins, insults such as hypotension and hopefully will see additional improvement.   2. Volume overload: Diuresed well with lasix 120 IV BID + metolazone 2.5 yesterday, will continue lasix at that dose today but hold on metolazone given UOP today already to 2L.      3. Bilat leg pain - seem out of proportion to symptoms one would expect from straight forward neph syndrome/ LE edema.  ?gout. U.A. is high. Trial of  empiric colchicine / po steroids.    4. Ruptured Baker's cyst / cellulitis - per primary, on cefazolin.  5. HL 6. Obesity 7.  HTN:   BPs in the 140-160, was on lisinopril 10 daily at home, follow with diuresis, may need antiHTN agent but with BP 136/84 this AM and still diuresing I didn't add anything yet so as to no precipitate hypotension.  Call with clinical status changes.    8/3 MD [facility] Pager [phone]   Recent Labs  Lab 11/10/19 [phone] 11/10/19 0534 11/11/19 0536 11/11/19 0536 11/12/19 0729 11/13/19 0648  K 4.6   < > 5.0   < > 4.6 4.3  BUN 80*   < > 94*   < > 107* 97*  CREATININE 2.54*   < > 2.89*   < > 3.16* 2.44*  CALCIUM 8.4*   < > 8.5*   < > 8.8* 8.7*  PHOS  --   --   --   --  6.3* 4.9*  HGB 10.0*  --  10.1*  --   --   --    < > = values in this interval not displayed.   Inpatient medications: . cefdinir  300 mg Oral BID  . Chlorhexidine Gluconate Cloth  6 each Topical Daily  . colchicine  0.3 mg Oral Daily  . docusate sodium  100 mg Oral BID  . escitalopram  20 mg Oral Daily  .  gemfibrozil  300 mg Oral Q breakfast  . heparin injection (subcutaneous)  5,000 Units Subcutaneous Q8H  . loratadine  10 mg Oral Daily  . polyethylene glycol  17 g Oral Daily  . predniSONE  2.5 mg Oral Q breakfast   . furosemide Stopped (11/13/19 1039)   acetaminophen **OR** acetaminophen, albuterol, labetalol, naLOXone (NARCAN)  injection **AND** sodium chloride flush, ondansetron **OR** ondansetron (ZOFRAN) IV, oxyCODONE-acetaminophen

## 2019-11-13 NOTE — Progress Notes (Signed)
Occupational Therapy Treatment Patient Details Name: Darren Hamilton MRN: [id number] DOB: [birth year] Today's Date: 11/13/2019    History of present illness 59 y.o. male with a history of hypertension, hyperlipidemia, asthma, anxiety, & nephrotic syndrome who comes to the ED 6/8  with complaints of worsening pain/swelling to the left lower extremity, unable to bear weight..Patient was seen in the emergency department 11/05/2019 for evaluation of similar symptoms, had left knee x-ray that was negative and LLE venous duplex that was negative for DVT, however did reveal ruptured baker's cyst. He was discharged home with norco .   OT comments  Treatment focused on participation in self care tasks and improving functional mobility. Today patient able to perform transfers with RW. Patient able to to perform grooming tasks from seated position in recliner, BSC transfer, and toileting with total assist. Patient's reduction of pain has improved his ability to perform transfers and beginning to perform ADLs out of bed. Cont POC.    Follow Up Recommendations  Home health OT;Supervision/Assistance - 24 hour;SNF    Equipment Recommendations  3 in 1 bedside commode;Tub/shower seat    Recommendations for Other Services      Precautions / Restrictions Precautions Precautions: Fall Precaution Comments: increased pain left foot Restrictions Weight Bearing Restrictions: No       Mobility Bed Mobility   Bed Mobility: Supine to Sit;Sit to Supine     Supine to sit: Min assist Sit to supine: Min guard   General bed mobility comments: patient  able to roll and get legs over bed edge, min assist  to sit upright. Patient abble to get legs back onto bed.  Transfers Overall transfer level: Needs assistance Equipment used: Rolling walker (2 wheeled) Transfers: Sit to/from [employer] Sit to Stand: +2 physical assistance;+2 safety/equipment;From elevated surface;Min assist;Mod  assist Stand pivot transfers: Mod assist;+2 safety/equipment       General transfer comment: first standing attempt at [facility] with increased Left foot pain. Sat down then performed a pivot, reaching for recliner arm. Trnasferred  to [facility] in same manner. difficulty  turning  on right foot only. Stood at [employer] for [employer]. Bed then brought up for patient to sit down.    Balance Overall balance assessment: Needs assistance Sitting-balance support: Feet unsupported;Bilateral upper extremity supported Sitting balance-Leahy Scale: Good     Standing balance support: Bilateral upper extremity supported;During functional activity                               ADL either performed or assessed with clinical judgement   ADL       Grooming: Oral care;Wash/dry hands;Sitting;Wash/dry face Grooming Details (indicate cue type and reason): Patient able to perform grooming tasks seated in chair today.                 Toilet Transfer: +2 for safety/equipment;Stand-pivot;BSC;Squat-pivot Toilet Transfer Details (indicate cue type and reason): Patient able to perform squat pivot to his right to transfer to [facility]. Reports pain with transfer. Toileting- Clothing Manipulation and Hygiene: Total assistance Toileting - Clothing Manipulation Details (indicate cue type and reason): Total assist for toileting as patient bearing down through walker to take weight off of feet.       General ADL Comments: Patient returned to bed after ADLs. Patient able to stand while BSC removed and bed positioned behind him.     Vision       Perception  Praxis      Cognition Arousal/Alertness: Awake/alert Behavior During Therapy: WFL for tasks assessed/performed Overall Cognitive Status: Within Functional Limits for tasks assessed                                          Exercises     Shoulder Instructions       General Comments      Pertinent Vitals/ Pain       Pain  Assessment: Faces Faces Pain Scale: Hurts little more Pain Location: left foot Pain Descriptors / Indicators: Grimacing;Guarding;Moaning Pain Intervention(s): Limited activity within patient's tolerance  Home Living Family/patient expects to be discharged to:: Private residence Living Arrangements: Spouse/significant other;Children Available Help at Discharge: Family Type of Home: House Home Access: Stairs to enter [employer] of Steps: 3 with L handrail at side, 2 without handrail at front   Home Layout: Two level;Able to live on main level with bedroom/bathroom     Bathroom Shower/Tub: Teacher, early years/pre: Standard Bathroom Accessibility: No   Home Equipment: Walker - 2 wheels;Crutches          Prior Functioning/Environment Level of Independence: Independent        Comments: Patient reports using a walker recently due to LE weakness. 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, engineering 2X/week        Progress Toward Goals  OT Goals(current goals can now be found in the care plan section)  Progress towards OT goals: Progressing toward goals     Plan Discharge plan remains appropriate;Frequency remains appropriate    Co-evaluation      Reason for Co-Treatment: For patient/therapist safety;To address functional/ADL transfers PT goals addressed during session: Mobility/safety with mobility OT goals addressed during session: ADL's and self-care      AM-PAC OT "6 Clicks" Daily Activity     Outcome Measure   Help from another person eating meals?: None Help from another person taking care of personal grooming?: A Little Help from another person toileting, which includes using toliet, bedpan, or urinal?: Total Help from another person bathing (including washing, rinsing, drying)?: A Lot Help from another person to put on and taking off regular upper body clothing?: A Little Help from another person to put on and taking off regular  lower body clothing?: Total 6 Click Score: 14    End of Session Equipment Utilized During Treatment: Rolling walker  OT Visit Diagnosis: Pain;Muscle weakness (generalized) (M62.81) Pain - Right/Left: Left Pain - part of body: Leg;Ankle and joints of foot   Activity Tolerance Patient limited by pain   Patient Left in bed;with call bell/phone within reach;with nursing/sitter in room   Nurse Communication Mobility status        Time: 1422-1500 OT Time Calculation (min): 38 min  Charges: OT General Charges $OT Visit: 1 Visit OT Treatments $Self Care/Home Management : 23-37 mins  Darren Hamilton, OTR/L Acute Care Rehab Services  Office [phone]    Lenward Chancellor 11/13/2019, 4:45 PM

## 2019-11-13 NOTE — Progress Notes (Signed)
Physical Therapy Treatment Patient Details Name: Darren Hamilton MRN: [id number] DOB: [birth year] Today's Date: 11/13/2019    History of Present Illness 59 y.o. male with a history of hypertension, hyperlipidemia, asthma, anxiety, & nephrotic syndrome who comes to the ED 6/8  with complaints of worsening pain/swelling to the left lower extremity, unable to bear weight..Patient was seen in the emergency department 11/05/2019 for evaluation of similar symptoms, had left knee x-ray that was negative and LLE venous duplex that was negative for DVT, however did reveal ruptured baker's cyst. He was discharged home with norco .    PT Comments    The patient reports leg pain is improved, able to transfer to recliner and [place], still decreased WB on the left foot noted with increased pain. Patient on 3 L [place], SPO2 >91%, HR 110-128 with activity. Continue  PT. Patient may require post acute rehab.    Follow Up Recommendations  Home health PT;Supervision - vs SNF for rehab.    Equipment Recommendations  3in1 (PT)    Recommendations for Other Services       Precautions / Restrictions Precautions Precautions: Fall Precaution Comments: increased pain left foot    Mobility  Bed Mobility   Bed Mobility: Supine to Sit;Sit to Supine     Supine to sit: Min assist Sit to supine: Min guard   General bed mobility comments: patient  able to roll and get legs over bed edge, min assist  to sit upright. Patient abble to get legs back onto bed.  Transfers Overall transfer level: Needs assistance Equipment used: Rolling walker (2 wheeled) Transfers: Sit to/from [facility] Sit to Stand: +2 physical assistance;+2 safety/equipment;From elevated surface;Min assist;Mod assist Stand pivot transfers: Mod assist;+2 safety/equipment       General transfer comment: first standing attempt at [facility] with increased Left foot pain. Sat down then performed a pivot, reaching for recliner arm.  Trnasferred  to [facility] in same manner. difficulty  turning  on right foot only. Stood at [employer] for [employer]. Bed then brought up for patient to sit down.  Ambulation/Gait                 Stairs             Wheelchair Mobility    Modified Rankin (Stroke Patients Only)       Balance Overall balance assessment: Needs assistance Sitting-balance support: Feet unsupported;Bilateral upper extremity supported Sitting balance-Leahy Scale: Good     Standing balance support: Bilateral upper extremity supported;During functional activity                                Cognition Arousal/Alertness: Awake/alert Behavior During Therapy: WFL for tasks assessed/performed Overall Cognitive Status: Within Functional Limits for tasks assessed                                        Exercises      General Comments        Pertinent Vitals/Pain Faces Pain Scale: Hurts whole lot Pain Location: left foot Pain Descriptors / Indicators: Grimacing;Guarding;Moaning;Crushing Pain Intervention(s): Monitored during session;Premedicated before session;Limited activity within patient's tolerance    Home Living                      Prior Function  PT Goals (current goals can now be found in the care plan section) Progress towards PT goals: Progressing toward goals    Frequency    Min 3X/week      PT Plan Current plan remains appropriate    Co-evaluation PT/OT/SLP Co-Evaluation/Treatment: Yes Reason for Co-Treatment: For patient/therapist safety;To address functional/ADL transfers PT goals addressed during session: Mobility/safety with mobility OT goals addressed during session: ADL's and self-care      AM-PAC PT "6 Clicks" Mobility   Outcome Measure  Help needed turning from your back to your side while in a flat bed without using bedrails?: A Little Help needed moving from lying on your back to sitting on the side of a flat  bed without using bedrails?: A Little Help needed moving to and from a bed to a chair (including a wheelchair)?: A Lot Help needed standing up from a chair using your arms (e.g., wheelchair or bedside chair)?: A Lot Help needed to walk in hospital room?: Total Help needed climbing 3-5 steps with a railing? : Total 6 Click Score: 12    End of Session   Activity Tolerance: Patient limited by pain Patient left: in bed;with call bell/phone within reach;with nursing/sitter in room Nurse Communication: Mobility status PT Visit Diagnosis: Other abnormalities of gait and mobility (R26.89);Pain Pain - Right/Left: Left Pain - part of body: Ankle and joints of foot     Time: 1422-1500 PT Time Calculation (min) (ACUTE ONLY): 38 min  Charges:  $Therapeutic Activity: 8-22 mins                     Blanchard Kelch PT Acute Rehabilitation Services Pager [phone] Office [phone]    Rada Hay 11/13/2019, 3:35 PM

## 2019-11-13 NOTE — Evaluation (Signed)
Clinical/Bedside Swallow Evaluation Patient Details  Name: Darren Hamilton MRN: [id number] Date of Birth: 01-31-1961  Today's Date: 11/13/2019 Time: SLP Start Time (ACUTE ONLY): 5631 SLP Stop Time (ACUTE ONLY): 1645 SLP Time Calculation (min) (ACUTE ONLY): 30 min  Past Medical History:  Past Medical History:  Diagnosis Date  . Anxiety   . Asthma   . Depression   . Hypertension   . Nephrotic syndrome    Past Surgical History:  Past Surgical History:  Procedure Laterality Date  . RENAL BIOPSY     HPI:  59 yo male adm to [facility] with left LE cellulitis - likely due to Baker's cyst rupture.  Pt PMH + for obesity, HTN, asthma, CKD.  Pt CXR 11/13/2019 w bilateral opacities consistent with edema vs infection, multifocal pna.  He does report xerostomia recently, suspects possibly due to medication side effects.   Assessment / Plan / Recommendation Clinical Impression  Pt's CN exam is unremarkable and his voice is clear.  Pt does become mildly dyspneic with movement in his bed.  He was observed consuming water, applesauce and graham crackers.  NO indication of aspiration/airway compromise or dysphagia with intake. Pt did advise he felt effort was needed to consume the solid and he felt tight in his pharynx.  Given his h/o taking Tums prn, SLP questions if his symptoms may be related to reflux. Advised pt start his meals with liquids to compensate for his xerostomia and refrain from eating if dyspneic.  Provided pt with written reflux precautions given his report of occasional reflux prior to admit.  Advised if his symptoms do not resolve to speak to MD but given acute onset - hopefully he medically improves dysphagia will abate. Thanks for this consult. SLP Visit Diagnosis: Dysphagia, unspecified (R13.10)    Aspiration Risk  No limitations    Diet Recommendation Regular;Thin liquid   Liquid Administration via: Cup;Straw Medication Administration: Whole meds with liquid Supervision: Patient  able to self feed Compensations: Slow rate;Small sips/bites Postural Changes: Seated upright at 90 degrees;Remain upright for at least 30 minutes after po intake    Other  Recommendations Oral Care Recommendations: Oral care BID   Follow up Recommendations None      Frequency and Duration   n/a         Prognosis    n/a    Swallow Study   General Date of Onset: 11/13/19 HPI: 59 yo male adm to [facility] with left LE cellulitis - likely due to Baker's cyst rupture.  Pt PMH + for obesity, HTN, asthma, CKD.  Pt CXR 11/13/2019 w bilateral opacities consistent with edema vs infection, multifocal pna.  He does report xerostomia recently, suspects possibly due to medication side effects. Type of Study: Bedside Swallow Evaluation Diet Prior to this Study: Regular;Thin liquids Temperature Spikes Noted: No Respiratory Status: (HFNC) History of Recent Intubation: No Behavior/Cognition: Alert;Cooperative;Pleasant mood Oral Cavity Assessment: Within Functional Limits Oral Care Completed by SLP: No Oral Cavity - Dentition: Adequate natural dentition Self-Feeding Abilities: Able to feed self Patient Positioning: Upright in bed Baseline Vocal Quality: Normal Volitional Cough: Strong Volitional Swallow: (DNT)    Oral/Motor/Sensory Function Overall Oral Motor/Sensory Function: Within functional limits   Ice Chips Ice chips: Not tested   Thin Liquid Thin Liquid: Within functional limits Presentation: Straw;Cup    Nectar Thick Nectar Thick Liquid: Not tested   Honey Thick Honey Thick Liquid: Not tested   Puree Puree: Within functional limits Presentation: Self Fed;Spoon   Solid  Solid: Within functional limits      Chales Abrahams 11/13/2019,5:27 PM  Rolena Infante, MS [facility] SLP Acute Rehab Services Office [phone]

## 2019-11-13 NOTE — Progress Notes (Signed)
No AM labs ordered for Pt. M. Katherina Right, NP contacted and order for Renal function panel obtained.

## 2019-11-14 LAB — CBC WITH DIFFERENTIAL/PLATELET
Abs Immature Granulocytes: 0.11 10*3/uL — ABNORMAL HIGH (ref 0.00–0.07)
Basophils Absolute: 0.1 10*3/uL (ref 0.0–0.1)
Basophils Relative: 1 %
Eosinophils Absolute: 0.2 10*3/uL (ref 0.0–0.5)
Eosinophils Relative: 3 %
HCT: 35.2 % — ABNORMAL LOW (ref 39.0–52.0)
Hemoglobin: 11.6 g/dL — ABNORMAL LOW (ref 13.0–17.0)
Immature Granulocytes: 1 %
Lymphocytes Relative: 19 %
Lymphs Abs: 1.7 10*3/uL (ref 0.7–4.0)
MCH: 30.5 pg (ref 26.0–34.0)
MCHC: 33 g/dL (ref 30.0–36.0)
MCV: 92.6 fL (ref 80.0–100.0)
Monocytes Absolute: 1.3 10*3/uL — ABNORMAL HIGH (ref 0.1–1.0)
Monocytes Relative: 15 %
Neutro Abs: 5.4 10*3/uL (ref 1.7–7.7)
Neutrophils Relative %: 61 %
Platelets: 450 10*3/uL — ABNORMAL HIGH (ref 150–400)
RBC: 3.8 MIL/uL — ABNORMAL LOW (ref 4.22–5.81)
RDW: 13.5 % (ref 11.5–15.5)
WBC: 8.8 10*3/uL (ref 4.0–10.5)
nRBC: 0 % (ref 0.0–0.2)

## 2019-11-14 LAB — RENAL FUNCTION PANEL
Albumin: 2.6 g/dL — ABNORMAL LOW (ref 3.5–5.0)
Anion gap: 12 (ref 5–15)
BUN: 97 mg/dL — ABNORMAL HIGH (ref 6–20)
CO2: 29 mmol/L (ref 22–32)
Calcium: 9 mg/dL (ref 8.9–10.3)
Chloride: 98 mmol/L (ref 98–111)
Creatinine, Ser: 1.98 mg/dL — ABNORMAL HIGH (ref 0.61–1.24)
GFR calc Af Amer: 43 mL/min — ABNORMAL LOW (ref >60)
GFR calc non Af Amer: 37 mL/min — ABNORMAL LOW (ref >60)
Glucose, Bld: 112 mg/dL — ABNORMAL HIGH (ref 70–99)
Phosphorus: 4.8 mg/dL — ABNORMAL HIGH (ref 2.5–4.6)
Potassium: 4.2 mmol/L (ref 3.5–5.1)
Sodium: 139 mmol/L (ref 135–145)

## 2019-11-14 LAB — CREATININE, SERUM
Creatinine, Ser: 2 mg/dL — ABNORMAL HIGH (ref 0.61–1.24)
GFR calc Af Amer: 42 mL/min — ABNORMAL LOW (ref >60)
GFR calc non Af Amer: 36 mL/min — ABNORMAL LOW (ref >60)

## 2019-11-14 MED ORDER — ORAL CARE MOUTH RINSE
15.0000 mL | Freq: Two times a day (BID) | OROMUCOSAL | Status: DC
  Administered 2019-11-14 – 2019-11-18 (×7): 15 mL via OROMUCOSAL

## 2019-11-14 MED ORDER — COLCHICINE 0.6 MG PO TABS
0.6000 mg | ORAL_TABLET | Freq: Every day | ORAL | Status: DC
  Administered 2019-11-15 – 2019-11-19 (×5): 0.6 mg via ORAL
  Filled 2019-11-14 (×5): qty 1

## 2019-11-14 MED ORDER — FUROSEMIDE 10 MG/ML IJ SOLN
80.0000 mg | Freq: Two times a day (BID) | INTRAMUSCULAR | Status: AC
  Administered 2019-11-14 – 2019-11-17 (×7): 80 mg via INTRAVENOUS
  Filled 2019-11-14 (×7): qty 8

## 2019-11-14 MED ORDER — CEFDINIR 125 MG/5ML PO SUSR
300.0000 mg | Freq: Two times a day (BID) | ORAL | Status: AC
  Administered 2019-11-14 – 2019-11-15 (×3): 300 mg via ORAL
  Filled 2019-11-14 (×3): qty 15

## 2019-11-14 NOTE — Progress Notes (Signed)
[facility] Progress Note  Subjective:  Diuresed 6.3L yesterday.  Feeling fatigued and still with LE pain and edema.  Cr improved.  No dysgeusia, no hiccups.  Down to 2L   Vitals:   11/14/19 0800 11/14/19 0833 11/14/19 1140 11/14/19 1200  BP:  (!) 160/94 (!) 143/97   Pulse:  82 89 90  Resp:  14 14 15   Temp: 97.9 F (36.6 C)   98.1 F (36.7 C)  TempSrc: Oral   Oral  SpO2:  93% 94% 94%  Weight:      Height:        Exam: Gen awake and alert Lungs: normal WOB today, no rales appreciated, 97% on 2L while conversating CV: RRR, no rub MS no joint effusions or deformity Ext 2+ diffuse pitting edema bilat lower legs (L > R)/ hips/ some abd wall edema - about the same as yesterday despite diuresis Neuro is alert, Ox 3 , nf    Home meds:  - lisinopril 10 qd/ zaroxolyn 5 qd/ torsemide 100 qd/ kcl 20 meq qid  - tacrolimus 5 bid/ prednisone 5mg  biw  - lopid 600 bid/ norco qid prn/ lexapro 20 qd  - prn's/ vitamins/ supplements    UA 6/3 - clear , 0-5 rbc/ wbc, rare bact  UNa 53, UCr 96    Assessment/ Plan: 1. 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 Thankfully renal function cont to improve today.  Will continue to avoid nephrotoxins, insults such as hypotension and hopefully will see additional improvement.   2. Volume overload: needs ongoing diuresis - lasix 80 IV BID today (diuresed 6L yesterday on 120 IV BID).   Low na diet, fluid restriction.   3. Bilat leg pain - seem out of proportion to symptoms one would expect from straight forward neph syndrome/ LE edema.  No DVT.  Suspect polyarticular gout precipitated by CNI initiation with uric acid > 10; rec'd burst of steroids and colchicine now (adjusted for renal function).   4. Ruptured Baker's cyst / cellulitis - per primary, on cefazolin.   5. HL 6. Obesity 7.  HTN:  Improving with diuresis.   Call with clinical status changes.    8/3 MD [facility] Pager [phone]   Recent Labs  Lab 11/11/19 [phone] 11/12/19 0729 11/13/19 0648 11/14/19 0522  K 5.0   < > 4.3 4.2  BUN 94*   < > 97* 97*  CREATININE 2.89*   < > 2.44* 2.00*  1.98*  CALCIUM 8.5*   < > 8.7* 9.0  PHOS  --    < > 4.9* 4.8*  HGB 10.1*  --   --  11.6*   < > = values in this interval not displayed.   Inpatient medications: . cefdinir  300 mg Oral BID  . Chlorhexidine Gluconate Cloth  6 each Topical Daily  . [START ON 11/15/2019] colchicine  0.6 mg Oral Daily  . docusate sodium  100 mg Oral BID  . escitalopram  20 mg Oral Daily  . gemfibrozil  300 mg Oral Q breakfast  . heparin injection (subcutaneous)  5,000 Units Subcutaneous Q8H  . loratadine  10 mg Oral Daily  . mouth rinse  15 mL Mouth Rinse BID  . polyethylene glycol  17 g Oral Daily  . predniSONE  2.5 mg Oral Q breakfast    acetaminophen **OR** acetaminophen, albuterol,  labetalol, naLOXone (NARCAN)  injection **AND** sodium chloride flush, ondansetron **OR** ondansetron (ZOFRAN) IV, oxyCODONE-acetaminophen

## 2019-11-14 NOTE — TOC Initial Note (Signed)
Transition of Care [facility]) - Initial/Assessment Note    Patient Details  Name: Darren Hamilton MRN: [id number] Date of Birth: 1961/03/08  [facility] of [facility] CM/SW Contact:    Lennart Pall, LCSW Phone Number: 11/14/2019, 2:31 PM  Clinical Narrative:     Met with pt to review potential d/c needs (per therapy recs) and living situation.  Pt is very pleasant and feels he is finally feeling his medical situation is improving.  Continues with significant pain in his LE and requiring O2.   Pt lives with his wife who is a retired Therapist, sports along with their 57yo son.  Pt works full-time locally as a Art therapist and fully plans to return when physically able to do so.   No immediate needs for TOC except possible [place] referrals.  Plan is to return home with wife and son.  TOC to continue following.              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 and independence      Expected Discharge Plan and Services Expected Discharge Plan: [place] (home with self care)       Living arrangements for the past 2 months: Single Family Home                                      Prior Living Arrangements/Services Living arrangements for the past 2 months: Single Family Home Lives with:: Spouse, Minor Children   Do you feel safe going back to the place where you live?: Yes      Need for Family Participation in Patient Care: No (Comment) Care giver support system in place?: Yes (comment)   Criminal Activity/Legal Involvement Pertinent to Current Situation/Hospitalization: No - Comment as needed  Activities of Daily Living Home Assistive Devices/Equipment: Eyeglasses, CPAP ADL Screening (condition at time of  admission) Patient's cognitive ability adequate to safely complete daily activities?: Yes Is the patient deaf or have difficulty hearing?: No Does the patient have difficulty seeing, even when wearing glasses/contacts?: No Does the patient have difficulty concentrating, remembering, or making decisions?: No Patient able to express need for assistance with ADLs?: Yes Does the patient have difficulty dressing or bathing?: No Independently performs ADLs?: No Communication: Independent Dressing (OT): Independent Grooming: Independent Feeding: Independent Bathing: Independent Toileting: Needs assistance Is this a change from baseline?: Change from baseline, expected to last >3days In/Out Bed: Needs assistance Is this a change from baseline?: Change from baseline, expected to last >3 days Walks in Home: Needs assistance Is this a change from baseline?: Change from baseline, expected to last >3 days Does the patient have difficulty walking or climbing stairs?: Yes (secondary to left leg pain) Weakness of Legs: Left Weakness of Arms/Hands: None  Permission Sought/Granted                  Emotional Assessment Appearance:: Appears stated age Attitude/Demeanor/Rapport: Engaged, Gracious Affect (typically observed): Calm, Accepting Orientation: : Oriented to Self, Oriented to Place, Oriented to  Time, Oriented to Situation Alcohol / Substance Use: Not Applicable Psych Involvement: No (comment)  Admission diagnosis:  Cellulitis of left lower extremity [L03.116] AKI (acute  kidney injury) ([place]) [N17.9] Cellulitis of left lower extremity without foot [L03.116] Cellulitis [L03.90] Patient Active Problem List   Diagnosis Date Noted  . Cellulitis 11/08/2019  . Cellulitis of left lower extremity without foot 11/07/2019  . Nephrotic syndrome 11/07/2019  . Seasonal allergies 06/26/2018  . Hyperlipidemia 06/26/2018  . Essential hypertension 06/26/2018  . Depression 11/09/2014   PCP:   Eunice Blase, MD Pharmacy:   [id number] [place] [address]Phone: [phone]: [phone]    Social Determinants of Health (SDOH) Interventions    Readmission Risk Interventions Readmission Risk Prevention Plan 11/14/2019  Transportation Screening Complete  PCP or Specialist Appt within 5-7 Days Complete  Home Care Screening Complete  Medication Review (RN CM) Complete  Some recent data might be hidden

## 2019-11-14 NOTE — Progress Notes (Signed)
PROGRESS NOTE    Darren Hamilton  UXN:[id number] DOB: [birth year] DOA: 11/07/2019 PCP: Eunice Blase, MD   Brief Narrative:  Patient is a 59 year old male with history of hypertension, CKD stage III with nephrotic syndrome, asthma, anxiety,recent  history of ruptured Baker's cyst in his left lower extremity who presented with complaints of pain and swelling on the left lower extremity which worsened with ambulation.  Patient was evaluated by his PCP for enrollment in PT program but pain got worse with weightbearing and range of motion on the left ankle.  He also reported poor oral intake for 3 days due to pain and poor ambulatory dysfunction.  In the emergency department, he was found to have elevated ESR, CRP concerning for infectious process.  His CT imaging of the left lower extremity showed subcutaneous edema concerning for cellulitis.  Venous duplex of the left lower extremity showed ruptured Baker's cyst, no DVT.  Patient was admitted for the management of cellulitis secondary to ruptured Baker's cyst.  Hospital course complicated by worsening renal function prompted nephrology evaluation.  He also has developed anasarca in the setting of aggressive IV hydration and hypoalbuminemia.  He has been started on IV Lasix/albumin.  Nephrology following.  Overall status is improving.  Assessment & Plan:   Principal Problem:   Cellulitis of left lower extremity without foot Active Problems:   Depression   Seasonal allergies   Hyperlipidemia   Essential hypertension   Nephrotic syndrome   Cellulitis   Acute hypoxic respiratory failure: Secondary to acute diastolic congestive heart failure, volume overload due to renal failure.  Chest x-ray showed vascular congestion with interstitial prominence and patchy bibasilar consolidation, small bilateral pleural effusions.  Currently on Lasix IV with albumin.  ABG showed hypoxia, normal CO2.  Continue monitor respiratory status.  This morning he was on 3  L of oxygen per minute.  We will attempt to wean the oxygen.  Altered mental status/metabolic encephalopathy: Appeared drowsy, sleepy which could be related to uremia versus lack of sleep so he was transferred to stepdown.  This morning he remains alert and oriented.  He states the change in the mental status  yesterday could be from lack of sleep.  Continue to monitor mental status.    AKI on CKD stage IIIa: Has history of nephrotic syndrome.  Biopsy showed FSGS in 04/2019.  He is on Prograf at home.  He follows with Dr. Posey Pronto, nephrology.  Nephrology following here.  Currently on Lasix 80 mg twice daily with albumin .lisinopril, Demadex, metoprolol on hold.  Kidney function improving.  Cellulitis of the left lower extremity/ruptured Baker's cyst: Presented with left lower extremity pain, swelling mainly on foot.  Elevated ESR, CRP.  CT showed edema suggesting cellulitis versus post Baker's cyst rupture inflammation.  Antibiotics changed to oral.  There is no findings of myositis or compartment syndrome or abscess.  Intractable pain: Suspected to be from ruptured Baker's cyst.  Doppler ultrasound negative for DVT.  He was complaining of left lower extremity pain on presentation but developed right lower extremity pain during this hospitalization.  He complains of pain on the right foot.  Seen by Ortho.  Found to have elevated uric acid level.  Started on colchicine,low dose prednisone.  Continue pain management. We have also requested for physical therapy evaluation.  Hyperlipidemia: On gemfibrozil  Hypertension: Blood pressure better this morning .  Continue current medications  Obesity: BMI of 44.3.         DVT prophylaxis: Subcu heparin Code  Status: Full code Family Communication: None present at the bedside Status is: Inpatient  Remains inpatient appropriate because:IV treatments appropriate due to intensity of illness or inability to take PO   Dispo: The patient is from: Home               Anticipated d/c is to: Home              Anticipated d/c date is:2 days              Patient currently is not medically stable to d/c.    Consultants: Nephrology, PCCM  Procedures: None  Antimicrobials:  Anti-infectives (From admission, onward)   Start     Dose/Rate Route Frequency Ordered Stop   11/11/19 2200  cefdinir (OMNICEF) 125 MG/5ML suspension 300 mg     Discontinue     300 mg Oral 2 times daily 11/11/19 2007     11/11/19 1000  ceFAZolin (ANCEF) IVPB 2g/100 mL premix  Status:  Discontinued        2 g 200 mL/hr over 30 Minutes Intravenous Every 12 hours 11/11/19 0742 11/11/19 2007   11/07/19 1445  ceFAZolin (ANCEF) IVPB 2g/100 mL premix  Status:  Discontinued        2 g 200 mL/hr over 30 Minutes Intravenous Every 8 hours 11/07/19 1436 11/11/19 0742   11/07/19 1100  ceFAZolin (ANCEF) IVPB 1 g/50 mL premix        1 g 100 mL/hr over 30 Minutes Intravenous  Once 11/07/19 1059 11/07/19 1315      Subjective:  Patient seen and examined at the bedside this morning.  Hemodynamically stable.  Feels better today.  Had a good sleep.  Mental status is great.  Still complains of bilateral ankle pain.  Appears volume overloaded.  Objective: Vitals:   11/14/19 0400 11/14/19 0500 11/14/19 0524 11/14/19 0600  BP: (!) 144/85  129/74 137/90  Pulse: 79  80 81  Resp: 18  19 17   Temp: 97.8 F (36.6 C)     TempSrc: Axillary     SpO2: 92%  93% 98%  Weight:  130.2 kg    Height:        Intake/Output Summary (Last 24 hours) at 11/14/2019 0803 Last data filed at 11/14/2019 0600 Gross per 24 hour  Intake 730 ml  Output 6250 ml  Net -5520 ml   Filed Weights   11/07/19 2316 11/11/19 0700 11/14/19 0500  Weight: 132.2 kg (!) 137.3 kg 130.2 kg    Examination:  General exam: Comfortable, morbidly obese,  HEENT:PERRL,Oral mucosa moist, Ear/Nose normal on gross exam Respiratory system: Bilateral equal air entry, normal vesicular breath sounds, no wheezes or crackles    Cardiovascular system: S1 & S2 heard, RRR. No JVD, murmurs, rubs, gallops or clicks. Gastrointestinal system: Abdomen is nondistended, soft and nontender. No organomegaly or masses felt. Normal bowel sounds heard. Central nervous system: Alert and oriented. No focal neurological deficits. Extremities: 3+ edema on bilateral lower extremities , no clubbing ,no cyanosis Skin: No rashes, lesions or ulcers,no icterus ,no pallor    Data Reviewed: I have personally reviewed following labs and imaging studies  CBC: Recent Labs  Lab 11/07/19 0930 11/07/19 0930 11/08/19 0503 11/09/19 0529 11/10/19 0534 11/11/19 0536 11/14/19 0522  WBC 9.5   < > 7.9 8.6 8.7 9.1 8.8  NEUTROABS 7.3  --   --   --   --   --  5.4  HGB 11.9*   < > 11.2* 11.1* 10.0* 10.1*  11.6*  HCT 36.1*   < > 34.1* 33.1* 30.4* 29.7* 35.2*  MCV 93.0   < > 93.9 92.7 93.8 90.3 92.6  PLT 387   < > 364 385 386 390 450*   < > = values in this interval not displayed.   Basic Metabolic Panel: Recent Labs  Lab 11/10/19 0534 11/11/19 0536 11/12/19 0729 11/13/19 0648 11/14/19 0522  NA 136 134* 137 138 139  K 4.6 5.0 4.6 4.3 4.2  CL 100 98 100 100 98  CO2 22 23 22 26 29   GLUCOSE 107* 160* 111* 105* 112*  BUN 80* 94* 107* 97* 97*  CREATININE 2.54* 2.89* 3.16* 2.44* 2.00*  1.98*  CALCIUM 8.4* 8.5* 8.8* 8.7* 9.0  PHOS  --   --  6.3* 4.9* 4.8*   GFR: Estimated Creatinine Clearance: 54.3 mL/min (A) (by C-G formula based on SCr of 2 mg/dL (H)). Liver Function Tests: Recent Labs  Lab 11/08/19 0503 11/12/19 0729 11/13/19 0648 11/14/19 0522  AST 13*  --   --   --   ALT 14  --   --   --   ALKPHOS 49  --   --   --   BILITOT 0.6  --   --   --   PROT 5.9*  --   --   --   ALBUMIN 1.7* 2.6* 2.7* 2.6*   No results for input(s): LIPASE, AMYLASE in the last 168 hours. No results for input(s): AMMONIA in the last 168 hours. Coagulation Profile: No results for input(s): INR, PROTIME in the last 168 hours. Cardiac Enzymes: Recent  Labs  Lab 11/07/19 0930 11/09/19 0529  CKTOTAL 280 300   BNP (last 3 results) No results for input(s): PROBNP in the last 8760 hours. HbA1C: No results for input(s): HGBA1C in the last 72 hours. CBG: No results for input(s): GLUCAP in the last 168 hours. Lipid Profile: No results for input(s): CHOL, HDL, LDLCALC, TRIG, CHOLHDL, LDLDIRECT in the last 72 hours. Thyroid Function Tests: No results for input(s): TSH, T4TOTAL, FREET4, T3FREE, THYROIDAB in the last 72 hours. Anemia Panel: No results for input(s): VITAMINB12, FOLATE, FERRITIN, TIBC, IRON, RETICCTPCT in the last 72 hours. Sepsis Labs: No results for input(s): PROCALCITON, LATICACIDVEN in the last 168 hours.  Recent Results (from the past 240 hour(s))  SARS Coronavirus 2 by RT PCR (hospital order, performed in [facility] hospital lab) Nasopharyngeal Nasopharyngeal Swab     Status: None   Collection Time: 11/07/19 10:55 AM   Specimen: Nasopharyngeal Swab  Result Value Ref Range Status   SARS Coronavirus 2 NEGATIVE NEGATIVE Final    Comment: (NOTE) SARS-CoV-2 target nucleic acids are NOT DETECTED. The SARS-CoV-2 RNA is generally detectable in upper and lower respiratory specimens during the acute phase of infection. The lowest concentration of SARS-CoV-2 viral copies this assay can detect is 250 copies / mL. A negative result does not preclude SARS-CoV-2 infection and should not be used as the sole basis for treatment or other patient management decisions.  A negative result may occur with improper specimen collection / handling, submission of specimen other than nasopharyngeal swab, presence of viral mutation(s) within the areas targeted by this assay, and inadequate number of viral copies (<250 copies / mL). A negative result must be combined with clinical observations, patient history, and epidemiological information. Fact Sheet for Patients:   StrictlyIdeas.no Fact Sheet for Healthcare  Providers: BankingDealers.co.za This test is not yet approved or cleared  by the [place] FDA and  has been authorized for detection and/or diagnosis of SARS-CoV-2 by FDA under an Emergency Use Authorization (EUA).  This EUA will remain in effect (meaning this test can be used) for the duration of the COVID-19 declaration under Section 564(b)(1) of the Act, 21 U.S.C. section 360bbb-3(b)(1), unless the authorization is terminated or revoked sooner. Performed at [facility], [place] [address], [place], [place] [address]   MRSA PCR Screening     Status: None   Collection Time: 11/12/19 11:25 AM   Specimen: Nasal Mucosa; Nasopharyngeal  Result Value Ref Range Status   MRSA by PCR NEGATIVE NEGATIVE Final    Comment:        The GeneXpert MRSA Assay (FDA approved for NASAL specimens only), is one component of a comprehensive MRSA colonization surveillance program. It is not intended to diagnose MRSA infection nor to guide or monitor treatment for MRSA infections. Performed at [facility], [place] [address] [place], [place] [address]          Radiology Studies: DG CHEST PORT 1 VIEW  Result Date: 11/12/2019 CLINICAL DATA:  Rapid response.  SOB. EXAM: PORTABLE CHEST 1 VIEW COMPARISON:  None. FINDINGS: Cardiac enlargement. Decreased lung volumes. New bilateral pulmonary opacities are identified particularly within the right upper lobe. Imaging findings may reflect areas of asymmetric alveolar edema or infection. IMPRESSION: New bilateral pulmonary opacities compatible with asymmetric alveolar edema versus multifocal infection. Electronically Signed   By: Kerby Moors M.D.   On: 11/12/2019 11:03        Scheduled Meds: . cefdinir  300 mg Oral BID  . Chlorhexidine Gluconate Cloth  6 each Topical Daily  . colchicine  0.3 mg Oral Daily  . docusate sodium  100 mg Oral BID  . escitalopram  20 mg Oral Daily  . gemfibrozil   300 mg Oral Q breakfast  . heparin injection (subcutaneous)  5,000 Units Subcutaneous Q8H  . loratadine  10 mg Oral Daily  . polyethylene glycol  17 g Oral Daily  . predniSONE  2.5 mg Oral Q breakfast   Continuous Infusions: . furosemide Stopped (11/13/19 1900)     LOS: 6 days    Time spent:35 mins., More than 50% of that time was spent in counseling and/or coordination of care.      Shelly Coss, MD [facility] P6/03/2020, 8:03 AM

## 2019-11-14 NOTE — Progress Notes (Deleted)
Pharmacy Brief Note:  Based on CrCl 54 mL/min, adjusted dose of allopurinol from 50 mg PO every other day to 50 mg PO daily  Herby Abraham, Pharm.D 11/14/2019 11:29 AM

## 2019-11-14 NOTE — Progress Notes (Signed)
Pharmacy Brief Note:  Based on CrCl 54 mL/min, adjusted dose of colchicine 0.3 mg daily to 0.6 mg daily  Herby Abraham, Pharm.D 11/14/2019 11:33 AM

## 2019-11-15 ENCOUNTER — Inpatient Hospital Stay (HOSPITAL_COMMUNITY): Payer: BC Managed Care – PPO

## 2019-11-15 DIAGNOSIS — N179 Acute kidney failure, unspecified: Secondary | ICD-10-CM | POA: Diagnosis not present

## 2019-11-15 DIAGNOSIS — J811 Chronic pulmonary edema: Secondary | ICD-10-CM | POA: Diagnosis not present

## 2019-11-15 DIAGNOSIS — R0602 Shortness of breath: Secondary | ICD-10-CM | POA: Diagnosis not present

## 2019-11-15 DIAGNOSIS — J810 Acute pulmonary edema: Secondary | ICD-10-CM | POA: Diagnosis not present

## 2019-11-15 DIAGNOSIS — N049 Nephrotic syndrome with unspecified morphologic changes: Secondary | ICD-10-CM | POA: Diagnosis not present

## 2019-11-15 DIAGNOSIS — N1831 Chronic kidney disease, stage 3a: Secondary | ICD-10-CM | POA: Diagnosis not present

## 2019-11-15 DIAGNOSIS — J181 Lobar pneumonia, unspecified organism: Secondary | ICD-10-CM | POA: Diagnosis not present

## 2019-11-15 DIAGNOSIS — I129 Hypertensive chronic kidney disease with stage 1 through stage 4 chronic kidney disease, or unspecified chronic kidney disease: Secondary | ICD-10-CM | POA: Diagnosis not present

## 2019-11-15 DIAGNOSIS — L03116 Cellulitis of left lower limb: Secondary | ICD-10-CM | POA: Diagnosis not present

## 2019-11-15 LAB — BASIC METABOLIC PANEL
Anion gap: 14 (ref 5–15)
BUN: 79 mg/dL — ABNORMAL HIGH (ref 6–20)
CO2: 27 mmol/L (ref 22–32)
Calcium: 8.9 mg/dL (ref 8.9–10.3)
Chloride: 97 mmol/L — ABNORMAL LOW (ref 98–111)
Creatinine, Ser: 1.77 mg/dL — ABNORMAL HIGH (ref 0.61–1.24)
GFR calc Af Amer: 49 mL/min — ABNORMAL LOW (ref >60)
GFR calc non Af Amer: 42 mL/min — ABNORMAL LOW (ref >60)
Glucose, Bld: 117 mg/dL — ABNORMAL HIGH (ref 70–99)
Potassium: 4.2 mmol/L (ref 3.5–5.1)
Sodium: 138 mmol/L (ref 135–145)

## 2019-11-15 MED ORDER — TRAZODONE HCL 50 MG PO TABS
50.0000 mg | ORAL_TABLET | Freq: Every day | ORAL | Status: DC
  Administered 2019-11-15 – 2019-11-18 (×4): 50 mg via ORAL
  Filled 2019-11-15 (×4): qty 1

## 2019-11-15 MED ORDER — GEMFIBROZIL 600 MG PO TABS
600.0000 mg | ORAL_TABLET | Freq: Two times a day (BID) | ORAL | Status: DC
  Administered 2019-11-16 – 2019-11-19 (×7): 600 mg via ORAL
  Filled 2019-11-15 (×10): qty 1

## 2019-11-15 MED ORDER — MELATONIN 5 MG PO TABS
5.0000 mg | ORAL_TABLET | Freq: Once | ORAL | Status: AC
  Administered 2019-11-15: 5 mg via ORAL
  Filled 2019-11-15: qty 1

## 2019-11-15 MED ORDER — OXYCODONE-ACETAMINOPHEN 5-325 MG PO TABS
2.0000 | ORAL_TABLET | ORAL | Status: DC | PRN
  Administered 2019-11-15 – 2019-11-17 (×5): 2 via ORAL
  Filled 2019-11-15 (×5): qty 2

## 2019-11-15 MED ORDER — HYDROCOD POLST-CPM POLST ER 10-8 MG/5ML PO SUER
5.0000 mL | Freq: Two times a day (BID) | ORAL | Status: DC | PRN
  Administered 2019-11-15 – 2019-11-18 (×7): 5 mL via ORAL
  Filled 2019-11-15 (×7): qty 5

## 2019-11-15 MED ORDER — ALBUTEROL SULFATE (2.5 MG/3ML) 0.083% IN NEBU
3.0000 mL | INHALATION_SOLUTION | RESPIRATORY_TRACT | Status: DC | PRN
  Administered 2019-11-15 – 2019-11-17 (×2): 3 mL via RESPIRATORY_TRACT
  Filled 2019-11-15 (×2): qty 3

## 2019-11-15 MED ORDER — GUAIFENESIN 100 MG/5ML PO SOLN
5.0000 mL | ORAL | Status: DC | PRN
  Administered 2019-11-15: 100 mg via ORAL
  Filled 2019-11-15: qty 10

## 2019-11-15 MED ORDER — GUAIFENESIN ER 600 MG PO TB12
1200.0000 mg | ORAL_TABLET | Freq: Two times a day (BID) | ORAL | Status: DC
  Administered 2019-11-15 – 2019-11-19 (×9): 1200 mg via ORAL
  Filled 2019-11-15 (×9): qty 2

## 2019-11-15 NOTE — Progress Notes (Signed)
[facility] Progress Note  Subjective:  Diuresed 4.7L yesterday on lower dose lasix 80IV BID but intake 2.9L.  Feeling fatigued and still with LE pain and edema.  Cr improved yet again to 1.77 today, electrolytes ok.  Going between RA and 2L. Insomina.   Vitals:   11/15/19 0800 11/15/19 0850 11/15/19 1000 11/15/19 1129  BP: (!) 146/99  (!) 155/94   Pulse: 83  94   Resp: 17  16   Temp: 97.8 F (36.6 C)   (!) 97.5 F (36.4 C)  TempSrc: Oral   Oral  SpO2: 92% 94% 96%   Weight:      Height:        Exam: Gen awake and alert Lungs: normal WOB today, no rales appreciated, 92% on RA while conversating  CV: RRR, no rub MS no joint effusions or deformity Ext 2+ diffuse pitting edema bilat lower legs (L > R)/ hips/ some abd wall edema - about the same as yesterday despite diuresis Neuro is alert, Ox 3 , nf    Home meds:  - lisinopril 10 qd/ zaroxolyn 5 qd/ torsemide 100 qd/ kcl 20 meq qid  - tacrolimus 5 bid/ prednisone 5mg  biw  - lopid 600 bid/ norco qid prn/ lexapro 20 qd  - prn's/ vitamins/ supplements    UA 6/3 - clear , 0-5 rbc/ wbc, rare bact  UNa 53, UCr 96    Assessment/ Plan: 1. Nephrotic syndrome secondary to FSGS (bx 93/2355) complicated by AKI:  suspect this is hemodynamically mediated injury with resultant ATN - low oncotic pressure, poor po intake/hypovolemia (prior to admission, wasn't ambulating due to pain and thus PO intake way down), ACEi, tacrolimus. Thankfully renal function cont to improve today.  Will continue to avoid nephrotoxins, insults such as hypotension and hopefully will see additional improvement.  Cont to hold acei but will resume when renal function normalizes. 2. Volume overload: needs ongoing diuresis - lasix 80 IV BID today.   Low na diet, fluid restriction to 1896mL added today 6/11.   3. Bilat leg pain - seem out of proportion to symptoms one would expect from straight forward neph syndrome/ LE edema.  No DVT.  Suspect  polyarticular gout precipitated by CNI initiation with uric acid > 10; rec'd burst of steroids and colchicine now (adjusted for renal function).  Seems to be slowly improving. 4. Ruptured Baker's cyst / cellulitis - per primary, on cefdinir.  5. HL 6. Obesity 7.  HTN:  Improving with diuresis.  8. Insomnia: prn trazodone added.   Call with questions   Jannifer Hick MD [facility] Pager [phone]   Recent Labs  Lab 11/11/19 [phone] 11/12/19 0729 11/13/19 0648 11/13/19 0648 11/14/19 0522 11/15/19 0302  K 5.0   < > 4.3   < > 4.2 4.2  BUN 94*   < > 97*   < > 97* 79*  CREATININE 2.89*   < > 2.44*   < > 2.00*  1.98* 1.77*  CALCIUM 8.5*   < > 8.7*   < > 9.0 8.9  PHOS  --    < > 4.9*  --  4.8*  --   HGB 10.1*  --   --   --  11.6*  --    < > = values in this interval not displayed.   Inpatient medications: . cefdinir  300 mg Oral BID  . Chlorhexidine Gluconate Cloth  6 each Topical Daily  . colchicine  0.6 mg Oral Daily  .  docusate sodium  100 mg Oral BID  . escitalopram  20 mg Oral Daily  . furosemide  80 mg Intravenous BID  . gemfibrozil  300 mg Oral Q breakfast  . heparin injection (subcutaneous)  5,000 Units Subcutaneous Q8H  . loratadine  10 mg Oral Daily  . mouth rinse  15 mL Mouth Rinse BID  . polyethylene glycol  17 g Oral Daily  . predniSONE  2.5 mg Oral Q breakfast  . traZODone  50 mg Oral QHS    acetaminophen **OR** acetaminophen, albuterol, chlorpheniramine-HYDROcodone, labetalol, naLOXone (NARCAN)  injection **AND** sodium chloride flush, ondansetron **OR** ondansetron (ZOFRAN) IV, oxyCODONE-acetaminophen

## 2019-11-15 NOTE — Progress Notes (Signed)
Physical Therapy Treatment Patient Details Name: Darren Hamilton MRN: [id number] DOB: [birth year] Today's Date: 11/15/2019    History of Present Illness 59 y.o. male with a history of hypertension, hyperlipidemia, asthma, anxiety, & nephrotic syndrome who comes to the ED 6/8  with complaints of worsening pain/swelling to the left lower extremity, unable to bear weight..Patient was seen in the emergency department 11/05/2019 for evaluation of similar symptoms, had left knee x-ray that was negative and LLE venous duplex that was negative for DVT, however did reveal ruptured baker's cyst. He was discharged home with norco .    PT Comments    Pt  Agreeable to OOB despite not getting any sleep last night (it does not help that pt has no real natural light in his room d/t negative pressure device). Continues to have pain with WBing L>R, however is able to amb short distance/take pivotal steps bed to chair VSS during PT session.   Follow Up Recommendations  Home health PT;Supervision - Intermittent     Equipment Recommendations  3in1 (PT)    Recommendations for Other Services       Precautions / Restrictions Restrictions Weight Bearing Restrictions: No    Mobility  Bed Mobility Overal bed mobility: Needs Assistance Bed Mobility: Supine to Sit     Supine to sit: Min guard;HOB elevated     General bed mobility comments: min/guard for safety, no physical assist. mild dizziness initially  Transfers Overall transfer level: Needs assistance Equipment used: Rolling walker (2 wheeled) Transfers: Sit to/from [employer] Sit to Stand: Min assist;+2 physical assistance;+2 safety/equipment Stand pivot transfers: Min assist;+2 physical assistance;+2 safety/equipment       General transfer comment: min to rise and transition to RW, pivotal steps ~ 4' bed to chair   Ambulation/Gait             General Gait Details: ~ 4' pivotal steps transition to chair. limited wt  on L foot with LLE more painful than R with WBing    Stairs             Wheelchair Mobility    Modified Rankin (Stroke Patients Only)       Balance           Standing balance support: Bilateral upper extremity supported;During functional activity   Standing balance comment:  (reliant on UEs d/t pain)                            Cognition Arousal/Alertness: Awake/alert Behavior During Therapy: WFL for tasks assessed/performed Overall Cognitive Status: Within Functional Limits for tasks assessed                                 General Comments: pt states he has not had any sleep, he is exhausted       Exercises      General Comments        Pertinent Vitals/Pain Pain Assessment: Faces Faces Pain Scale: Hurts whole lot Pain Descriptors / Indicators: Grimacing;Guarding;Moaning Pain Intervention(s): Limited activity within patient's tolerance;Monitored during session;Premedicated before session;Repositioned    Home Living                      Prior Function            PT Goals (current goals can now be found in the care plan section) Acute Rehab PT Goals  PT Goal Formulation: With patient Time For Goal Achievement: 11/15/19 Potential to Achieve Goals: Good Progress towards PT goals: Progressing toward goals    Frequency    Min 3X/week      PT Plan Current plan remains appropriate    Co-evaluation              AM-PAC PT "6 Clicks" Mobility   Outcome Measure  Help needed turning from your back to your side while in a flat bed without using bedrails?: A Little Help needed moving from lying on your back to sitting on the side of a flat bed without using bedrails?: A Little Help needed moving to and from a bed to a chair (including a wheelchair)?: A Little Help needed standing up from a chair using your arms (e.g., wheelchair or bedside chair)?: A Little Help needed to walk in hospital room?: A Little Help  needed climbing 3-5 steps with a railing? : A Lot 6 Click Score: 17    End of Session Equipment Utilized During Treatment: Gait belt Activity Tolerance: Patient tolerated treatment well;Patient limited by pain Patient left: in chair;with call bell/phone within reach;with chair alarm set Nurse Communication: Mobility status PT Visit Diagnosis: Other abnormalities of gait and mobility (R26.89);Pain Pain - Right/Left: Left Pain - part of body: Ankle and joints of foot     Time: 3976-7341 PT Time Calculation (min) (ACUTE ONLY): 15 min  Charges:  $Therapeutic Activity: 8-22 mins                     Baxter Flattery, PT  Acute Rehab Dept [facility]) [phone] Pager [phone]  11/15/2019    [facility] 11/15/2019, 12:56 PM

## 2019-11-15 NOTE — Plan of Care (Signed)
Discussed with patient plan of care for the remainder of the shift, pain management and insomnia with some teach back displayed.  Patient refused any calls to MD for sleep medication and gave requested warm milk to assist.

## 2019-11-15 NOTE — Progress Notes (Signed)
PROGRESS NOTE    Darren Hamilton  EPP:[id number] DOB: [birth year] DOA: 11/07/2019 PCP: Eunice Blase, MD   Brief Narrative:  Patient is a 59 year old male with history of hypertension, CKD stage III with nephrotic syndrome, asthma, anxiety,recent  history of ruptured Baker's cyst in his left lower extremity who presented with complaints of pain and swelling on the left lower extremity which worsened with ambulation.  Patient was evaluated by his PCP for enrollment in PT program but pain got worse with weightbearing and range of motion on the left ankle.  He also reported poor oral intake for 3 days due to pain and poor ambulatory dysfunction.  In the emergency department, he was found to have elevated ESR, CRP concerning for infectious process.  His CT imaging of the left lower extremity showed subcutaneous edema concerning for cellulitis.  Venous duplex of the left lower extremity showed ruptured Baker's cyst, no DVT.  Patient was admitted for the management of cellulitis secondary to ruptured Baker's cyst.  Hospital course complicated by worsening renal function prompted nephrology evaluation.  He also  developed anasarca in the setting of aggressive IV hydration and hypoalbuminemia.  He has been started on IV Lasix/albumin.  Nephrology following.  Overall status is improving.  Assessment & Plan:   Principal Problem:   Cellulitis of left lower extremity without foot Active Problems:   Depression   Seasonal allergies   Hyperlipidemia   Essential hypertension   Nephrotic syndrome   Cellulitis   Acute hypoxic respiratory failure: Secondary to acute diastolic congestive heart failure, volume overload due to renal failure.  Chest x-ray showed vascular congestion with interstitial prominence and patchy bibasilar consolidation, small bilateral pleural effusions.  Currently on Lasix IV,he was also given albumin IV.    Continue monitor respiratory status.  This morning he was on 2 L of oxygen per  minute.  We will attempt to wean the oxygen. We will check a chest x-ray today.  He was also complaining of some cough, mucolytics ordered.  Altered mental status/metabolic encephalopathy: This morning he remains alert and oriented.  Altered mental status was attributed to possible lack of sleep.  Continue to monitor mental status.  Continue trazodone at bedtime.  He continues to report lack of sleep.  AKI: Has history of nephrotic syndrome.  Biopsy showed FSGS in 04/2019.  He is on Prograf at home.  He follows with Dr. Posey Pronto, nephrology.  Nephrology following here.  Currently on Lasix 80 mg IV twice daily with albumin .lisinopril, Demadex, metoprolol on hold.  Kidney function improving.Cr in the range of 1.7 today.  Cellulitis of the left lower extremity/ruptured Baker's cyst: Presented with left lower extremity pain, swelling mainly on foot.  Elevated ESR, CRP.  CT showed edema suggesting cellulitis versus post Baker's cyst rupture inflammation.  Antibiotics changed to oral and and he finished the course there is no findings of myositis or compartment syndrome or abscess.  Intractable pain: Suspected to be from ruptured Baker's cyst.  Doppler ultrasound negative for DVT.  He was complaining of left lower extremity pain on presentation but developed right lower extremity pain during this hospitalization.    Seen by Ortho also.  Found to have elevated uric acid level.  Started on colchicine,low dose prednisone.  Continue pain management.  Continues to complain of pain on bilateral ankles.  Continue supportive care. He does not have any documented history of gout.  He was started on colchicine because uric acid was high and he was complaining of bilateral  ankle pain.  He can follow-up with rheumatology as an outpatient. We have also requested for physical therapy evaluation.  Hyperlipidemia: On gemfibrozil  Hypertension: Blood pressure better this morning .  Continue current medications  Obesity: BMI  of 44.3. Patient seen by physical therapy and recommended home health PT on discharge.         DVT prophylaxis: Subcu heparin Code Status: Full code Family Communication: Called wife on phone today Status is: Inpatient  Remains inpatient appropriate because:IV treatments appropriate due to intensity of illness or inability to take PO   Dispo: The patient is from: Home              Anticipated d/c is to: Home              Anticipated d/c date is:2 days              Patient currently is not medically stable to d/c. Still appears volume overloaded needing Lasix. .  Needs to be cleared by nephrology before discharge.   Consultants: Nephrology, PCCM  Procedures: None  Antimicrobials:  Anti-infectives (From admission, onward)   Start     Dose/Rate Route Frequency Ordered Stop   11/14/19 2200  cefdinir (OMNICEF) 125 MG/5ML suspension 300 mg     Discontinue     300 mg Oral 2 times daily 11/14/19 1147 11/16/19 0959   11/11/19 2200  cefdinir (OMNICEF) 125 MG/5ML suspension 300 mg  Status:  Discontinued        300 mg Oral 2 times daily 11/11/19 2007 11/14/19 1147   11/11/19 1000  ceFAZolin (ANCEF) IVPB 2g/100 mL premix  Status:  Discontinued        2 g 200 mL/hr over 30 Minutes Intravenous Every 12 hours 11/11/19 0742 11/11/19 2007   11/07/19 1445  ceFAZolin (ANCEF) IVPB 2g/100 mL premix  Status:  Discontinued        2 g 200 mL/hr over 30 Minutes Intravenous Every 8 hours 11/07/19 1436 11/11/19 0742   11/07/19 1100  ceFAZolin (ANCEF) IVPB 1 g/50 mL premix        1 g 100 mL/hr over 30 Minutes Intravenous  Once 11/07/19 1059 11/07/19 1315      Subjective:  Patient seen and examined at the bedside this morning.  Hemodynamically stable.  Looks comfortable.  He said he could not sleep last night.  Bilateral lower extremity edema have been improving.  Continues to complain of pain on bilateral ankles   Objective: Vitals:   11/15/19 0501 11/15/19 0600 11/15/19 0639 11/15/19 0700    BP: (!) 172/125  (!) 151/84 140/83  Pulse: 96 79 86 92  Resp: 15 20 12 19   Temp:      TempSrc:      SpO2: 91% 94% 94% 95%  Weight:      Height:        Intake/Output Summary (Last 24 hours) at 11/15/2019 0801 Last data filed at 11/15/2019 0500 Gross per 24 hour  Intake 2860 ml  Output 4725 ml  Net -1865 ml   Filed Weights   11/11/19 0700 11/14/19 0500 11/15/19 0500  Weight: (!) 137.3 kg 130.2 kg 128.9 kg    Examination:    General exam: Not in distress, morbidly obese HEENT:PERRL,Oral mucosa moist, Ear/Nose normal on gross exam Respiratory system: Bilateral equal air entry, normal vesicular breath sounds, no wheezes or crackles  Cardiovascular system: S1 & S2 heard, RRR. No JVD, murmurs, rubs, gallops or clicks. Gastrointestinal system: Abdomen is nondistended, soft  and nontender. No organomegaly or masses felt. Normal bowel sounds heard. Central nervous system: Alert and oriented. No focal neurological deficits. Extremities: 2-3+ bilateral lower extremity edema, no clubbing ,no cyanosis Skin: No rashes, lesions or ulcers,no icterus ,no pallor    Data Reviewed: I have personally reviewed following labs and imaging studies  CBC: Recent Labs  Lab 11/09/19 0529 11/10/19 0534 11/11/19 0536 11/14/19 0522  WBC 8.6 8.7 9.1 8.8  NEUTROABS  --   --   --  5.4  HGB 11.1* 10.0* 10.1* 11.6*  HCT 33.1* 30.4* 29.7* 35.2*  MCV 92.7 93.8 90.3 92.6  PLT 385 386 390 644*   Basic Metabolic Panel: Recent Labs  Lab 11/11/19 0536 11/12/19 0729 11/13/19 0648 11/14/19 0522 11/15/19 0302  NA 134* 137 138 139 138  K 5.0 4.6 4.3 4.2 4.2  CL 98 100 100 98 97*  CO2 23 22 26 29 27   GLUCOSE 160* 111* 105* 112* 117*  BUN 94* 107* 97* 97* 79*  CREATININE 2.89* 3.16* 2.44* 2.00*  1.98* 1.77*  CALCIUM 8.5* 8.8* 8.7* 9.0 8.9  PHOS  --  6.3* 4.9* 4.8*  --    GFR: Estimated Creatinine Clearance: 61 mL/min (A) (by C-G formula based on SCr of 1.77 mg/dL (H)). Liver Function  Tests: Recent Labs  Lab 11/12/19 0729 11/13/19 0648 11/14/19 0522  ALBUMIN 2.6* 2.7* 2.6*   No results for input(s): LIPASE, AMYLASE in the last 168 hours. No results for input(s): AMMONIA in the last 168 hours. Coagulation Profile: No results for input(s): INR, PROTIME in the last 168 hours. Cardiac Enzymes: Recent Labs  Lab 11/09/19 0529  CKTOTAL 300   BNP (last 3 results) No results for input(s): PROBNP in the last 8760 hours. HbA1C: No results for input(s): HGBA1C in the last 72 hours. CBG: No results for input(s): GLUCAP in the last 168 hours. Lipid Profile: No results for input(s): CHOL, HDL, LDLCALC, TRIG, CHOLHDL, LDLDIRECT in the last 72 hours. Thyroid Function Tests: No results for input(s): TSH, T4TOTAL, FREET4, T3FREE, THYROIDAB in the last 72 hours. Anemia Panel: No results for input(s): VITAMINB12, FOLATE, FERRITIN, TIBC, IRON, RETICCTPCT in the last 72 hours. Sepsis Labs: No results for input(s): PROCALCITON, LATICACIDVEN in the last 168 hours.  Recent Results (from the past 240 hour(s))  SARS Coronavirus 2 by RT PCR (hospital order, performed in [facility] hospital lab) Nasopharyngeal Nasopharyngeal Swab     Status: None   Collection Time: 11/07/19 10:55 AM   Specimen: Nasopharyngeal Swab  Result Value Ref Range Status   SARS Coronavirus 2 NEGATIVE NEGATIVE Final    Comment: (NOTE) SARS-CoV-2 target nucleic acids are NOT DETECTED. The SARS-CoV-2 RNA is generally detectable in upper and lower respiratory specimens during the acute phase of infection. The lowest concentration of SARS-CoV-2 viral copies this assay can detect is 250 copies / mL. A negative result does not preclude SARS-CoV-2 infection and should not be used as the sole basis for treatment or other patient management decisions.  A negative result may occur with improper specimen collection / handling, submission of specimen other than nasopharyngeal swab, presence of viral mutation(s)  within the areas targeted by this assay, and inadequate number of viral copies (<250 copies / mL). A negative result must be combined with clinical observations, patient history, and epidemiological information. Fact Sheet for Patients:   StrictlyIdeas.no Fact Sheet for Healthcare Providers: BankingDealers.co.za This test is not yet approved or cleared  by the [place] FDA and has been authorized for  detection and/or diagnosis of SARS-CoV-2 by FDA under an Emergency Use Authorization (EUA).  This EUA will remain in effect (meaning this test can be used) for the duration of the COVID-19 declaration under Section 564(b)(1) of the Act, 21 U.S.C. section 360bbb-3(b)(1), unless the authorization is terminated or revoked sooner. Performed at [facility], [place] [address], [place], [place] [address]   MRSA PCR Screening     Status: None   Collection Time: 11/12/19 11:25 AM   Specimen: Nasal Mucosa; Nasopharyngeal  Result Value Ref Range Status   MRSA by PCR NEGATIVE NEGATIVE Final    Comment:        The GeneXpert MRSA Assay (FDA approved for NASAL specimens only), is one component of a comprehensive MRSA colonization surveillance program. It is not intended to diagnose MRSA infection nor to guide or monitor treatment for MRSA infections. Performed at [facility], [place] [address], [place], [place] [address]          Radiology Studies: No results found.      Scheduled Meds: . cefdinir  300 mg Oral BID  . Chlorhexidine Gluconate Cloth  6 each Topical Daily  . colchicine  0.6 mg Oral Daily  . docusate sodium  100 mg Oral BID  . escitalopram  20 mg Oral Daily  . furosemide  80 mg Intravenous BID  . gemfibrozil  300 mg Oral Q breakfast  . heparin injection (subcutaneous)  5,000 Units Subcutaneous Q8H  . loratadine  10 mg Oral Daily  . mouth rinse  15 mL Mouth Rinse BID  . polyethylene  glycol  17 g Oral Daily  . predniSONE  2.5 mg Oral Q breakfast   Continuous Infusions:    LOS: 7 days    Time spent:35 mins., More than 50% of that time was spent in counseling and/or coordination of care.      Shelly Coss, MD [facility] P6/04/2020, 8:01 AM

## 2019-11-16 LAB — CBC WITH DIFFERENTIAL/PLATELET
Abs Immature Granulocytes: 0.15 10*3/uL — ABNORMAL HIGH (ref 0.00–0.07)
Basophils Absolute: 0.1 10*3/uL (ref 0.0–0.1)
Basophils Relative: 1 %
Eosinophils Absolute: 0.5 10*3/uL (ref 0.0–0.5)
Eosinophils Relative: 4 %
HCT: 33.5 % — ABNORMAL LOW (ref 39.0–52.0)
Hemoglobin: 11 g/dL — ABNORMAL LOW (ref 13.0–17.0)
Immature Granulocytes: 1 %
Lymphocytes Relative: 14 %
Lymphs Abs: 1.7 10*3/uL (ref 0.7–4.0)
MCH: 30.6 pg (ref 26.0–34.0)
MCHC: 32.8 g/dL (ref 30.0–36.0)
MCV: 93.3 fL (ref 80.0–100.0)
Monocytes Absolute: 1.2 10*3/uL — ABNORMAL HIGH (ref 0.1–1.0)
Monocytes Relative: 11 %
Neutro Abs: 7.9 10*3/uL — ABNORMAL HIGH (ref 1.7–7.7)
Neutrophils Relative %: 69 %
Platelets: 412 10*3/uL — ABNORMAL HIGH (ref 150–400)
RBC: 3.59 MIL/uL — ABNORMAL LOW (ref 4.22–5.81)
RDW: 13.4 % (ref 11.5–15.5)
WBC: 11.4 10*3/uL — ABNORMAL HIGH (ref 4.0–10.5)
nRBC: 0 % (ref 0.0–0.2)

## 2019-11-16 LAB — BASIC METABOLIC PANEL
Anion gap: 12 (ref 5–15)
BUN: 67 mg/dL — ABNORMAL HIGH (ref 6–20)
CO2: 30 mmol/L (ref 22–32)
Calcium: 9.1 mg/dL (ref 8.9–10.3)
Chloride: 98 mmol/L (ref 98–111)
Creatinine, Ser: 1.75 mg/dL — ABNORMAL HIGH (ref 0.61–1.24)
GFR calc Af Amer: 49 mL/min — ABNORMAL LOW (ref >60)
GFR calc non Af Amer: 43 mL/min — ABNORMAL LOW (ref >60)
Glucose, Bld: 98 mg/dL (ref 70–99)
Potassium: 3.9 mmol/L (ref 3.5–5.1)
Sodium: 140 mmol/L (ref 135–145)

## 2019-11-16 NOTE — Progress Notes (Signed)
PROGRESS NOTE    Darren Hamilton  NUU:[id number] DOB: 22-Mar-1961 DOA: 11/07/2019 PCP: Eunice Blase, MD    Brief Narrative: Patient is a 59 year old male with history of hypertension, CKD stage III with nephrotic syndrome, asthma, anxiety,recent  history of ruptured Baker's cyst in his left lower extremity who presented with complaints of pain and swelling on the left lower extremity which worsened with ambulation.  Patient was evaluated by his PCP for enrollment in PT program but pain got worse with weightbearing and range of motion on the left ankle.  He also reported poor oral intake for 3 days due to pain and poor ambulatory dysfunction.  In the emergency department, he was found to have elevated ESR, CRP concerning for infectious process.  His CT imaging of the left lower extremity showed subcutaneous edema concerning for cellulitis.  Venous duplex of the left lower extremity showed ruptured Baker's cyst, no DVT.  Patient was admitted for the management of cellulitis secondary to ruptured Baker's cyst.  Hospital course complicated by worsening renal function prompted nephrology evaluation.  He also  developed anasarca in the setting of aggressive IV hydration and hypoalbuminemia.  He has been started on IV Lasix/albumin.  Nephrology following.  Overall status is improving.  Assessment & Plan:   Principal Problem:   Cellulitis of left lower extremity without foot Active Problems:   Depression   Seasonal allergies   Hyperlipidemia   Essential hypertension   Nephrotic syndrome   Cellulitis  Acute hypoxic respiratory failure: Secondary to acute diastolic congestive heart failure, volume overload due to renal failure.  Chest x-ray showed vascular congestion with interstitial prominence and patchy bibasilar consolidation, small bilateral pleural effusions.  Currently on Lasix IV,he was also given albumin IV.   Oxygen tapered down to room air. Chest x-ray 11/15/2019 with near complete  resolution of bilateral lung opacities favoring resolved pulmonary edema and improved lung aeration with no new abnormality. Negative by 11.5 L.  Altered mental status/metabolic encephalopathy: Improving.  He went right back to sleep while speaking to me.  He reported using CPAP at night.  On trazodone for insomnia.    AKI: Has history of nephrotic syndrome.  Biopsy showed FSGS in 04/2019.  He is on Prograf at home.  He follows with Dr. Posey Pronto, nephrology.  Nephrology following here.  Currently on Lasix 80 mg IV twice daily with albumin, .lisinopril, Demadex, metoprolol on hold.  Kidney function improving.Cr in the range of 1.75 today. Remains on IV diuresis.  Appears volume overloaded still.  Cellulitis of the left lower extremity/ruptured Baker's cyst: Presented with left lower extremity pain, swelling mainly on foot.  Elevated ESR, CRP.  CT showed edema suggesting cellulitis versus post Baker's cyst rupture inflammation.  Antibiotics changed to oral and and he finished the course there is no findings of myositis or compartment syndrome or abscess. Seen by PT recommends home health physical therapy on discharge. Patient still finding difficulty to walk or ambulate in the room or in the hallway.  Intractable pain: Suspected to be from ruptured Baker's cyst and acute gout. Doppler ultrasound negative for DVT.  He was complaining of left lower extremity pain on presentation but developed right lower extremity pain during this hospitalization.    Seen by Ortho also.  Found to have elevated uric acid level.  Started on colchicine,low dose prednisone.  Continue pain management.  Continues to complain of pain on bilateral ankles.  Continue supportive care. He does not have any documented history of gout.  He  was started on colchicine because uric acid was high and he was complaining of bilateral ankle pain.  He can follow-up with rheumatology as an outpatient.  Hyperlipidemia: On  gemfibrozil  Hypertension: Blood pressure 148/79.  Continue current medications.    Obesity: BMI of 44.3. Patient seen by physical therapy and recommended home health PT on discharge.     DVT prophylaxis: Subcu heparin Code Status: Full code Family Communication: Discussed with patient. Status is: Inpatient  Remains inpatient appropriate because:IV treatments appropriate due to intensity of illness IV diuresis. Dispo: The patient is from: Home  Anticipated d/c is to: Home  Anticipated d/c date is:1 days  Patient currently is not medically stable to d/c. Still appears volume overloaded needing Lasix. .  Needs to be cleared by nephrology before discharge.   Consultants: Nephrology, PCCM  Procedures: None  Antimicrobials:  Anti-infectives (From admission, onward)   Start     Dose/Rate Route Frequency Ordered Stop   11/14/19 2200  cefdinir (OMNICEF) 125 MG/5ML suspension 300 mg        300 mg Oral 2 times daily 11/14/19 1147 11/15/19 2134   11/11/19 2200  cefdinir (OMNICEF) 125 MG/5ML suspension 300 mg  Status:  Discontinued        300 mg Oral 2 times daily 11/11/19 2007 11/14/19 1147   11/11/19 1000  ceFAZolin (ANCEF) IVPB 2g/100 mL premix  Status:  Discontinued        2 g 200 mL/hr over 30 Minutes Intravenous Every 12 hours 11/11/19 0742 11/11/19 2007   11/07/19 1445  ceFAZolin (ANCEF) IVPB 2g/100 mL premix  Status:  Discontinued        2 g 200 mL/hr over 30 Minutes Intravenous Every 8 hours 11/07/19 1436 11/11/19 0742   11/07/19 1100  ceFAZolin (ANCEF) IVPB 1 g/50 mL premix        1 g 100 mL/hr over 30 Minutes Intravenous  Once 11/07/19 1059 11/07/19 1315      Estimated body mass index is 43.11 kg/m as calculated from the following:   Height as of this encounter: 5' 8"  (1.727 m).   Weight as of this encounter: 128.6 kg.     Subjective: Patient resting in bed in no acute distress was asleep when I walked in a falls  back to sleep while speaking to me he reports using CPAP last night Continues to complain of bilateral lower extremity pain not able to put weight on his left lower extremity still.  Objective: Vitals:   11/15/19 1849 11/15/19 2222 11/16/19 0304 11/16/19 0308  BP: (!) 151/89 (!) 156/84 (!) 148/79   Pulse: 89 98 78   Resp: 18 16 16    Temp: 98.6 F (37 C) 97.9 F (36.6 C) 98.1 F (36.7 C)   TempSrc: Oral     SpO2: 96% 93% 93%   Weight:    128.6 kg  Height:        Intake/Output Summary (Last 24 hours) at 11/16/2019 0845 Last data filed at 11/16/2019 0500 Gross per 24 hour  Intake --  Output 2275 ml  Net -2275 ml   Filed Weights   11/14/19 0500 11/15/19 0500 11/16/19 0308  Weight: 130.2 kg 128.9 kg 128.6 kg    Examination:  General exam: Appears calm and comfortable  Respiratory system: Decreased breath sounds at the bases to auscultation. Respiratory effort normal. Cardiovascular system: S1 & S2 heard, RRR. No JVD, murmurs, rubs, gallops or clicks. No pedal edema. Gastrointestinal system: Abdomen is nondistended, soft and nontender. No  organomegaly or masses felt. Normal bowel sounds heard. Central nervous system: Alert and oriented. No focal neurological deficits. Extremities: Bilateral lower extremity edema left more than right with erythema to the left lower extremity Skin: No rashes, lesions or ulcers Psychiatry: Judgement and insight appear normal. Mood & affect appropriate.     Data Reviewed: I have personally reviewed following labs and imaging studies  CBC: Recent Labs  Lab 11/10/19 0534 11/11/19 0536 11/14/19 0522 11/16/19 0528  WBC 8.7 9.1 8.8 11.4*  NEUTROABS  --   --  5.4 7.9*  HGB 10.0* 10.1* 11.6* 11.0*  HCT 30.4* 29.7* 35.2* 33.5*  MCV 93.8 90.3 92.6 93.3  PLT 386 390 450* 599*   Basic Metabolic Panel: Recent Labs  Lab 11/12/19 0729 11/13/19 0648 11/14/19 0522 11/15/19 0302 11/16/19 0528  NA 137 138 139 138 140  K 4.6 4.3 4.2 4.2 3.9  CL  100 100 98 97* 98  CO2 22 26 29 27 30   GLUCOSE 111* 105* 112* 117* 98  BUN 107* 97* 97* 79* 67*  CREATININE 3.16* 2.44* 2.00*  1.98* 1.77* 1.75*  CALCIUM 8.8* 8.7* 9.0 8.9 9.1  PHOS 6.3* 4.9* 4.8*  --   --    GFR: Estimated Creatinine Clearance: 61.7 mL/min (A) (by C-G formula based on SCr of 1.75 mg/dL (H)). Liver Function Tests: Recent Labs  Lab 11/12/19 0729 11/13/19 0648 11/14/19 0522  ALBUMIN 2.6* 2.7* 2.6*   No results for input(s): LIPASE, AMYLASE in the last 168 hours. No results for input(s): AMMONIA in the last 168 hours. Coagulation Profile: No results for input(s): INR, PROTIME in the last 168 hours. Cardiac Enzymes: No results for input(s): CKTOTAL, CKMB, CKMBINDEX, TROPONINI in the last 168 hours. BNP (last 3 results) No results for input(s): PROBNP in the last 8760 hours. HbA1C: No results for input(s): HGBA1C in the last 72 hours. CBG: No results for input(s): GLUCAP in the last 168 hours. Lipid Profile: No results for input(s): CHOL, HDL, LDLCALC, TRIG, CHOLHDL, LDLDIRECT in the last 72 hours. Thyroid Function Tests: No results for input(s): TSH, T4TOTAL, FREET4, T3FREE, THYROIDAB in the last 72 hours. Anemia Panel: No results for input(s): VITAMINB12, FOLATE, FERRITIN, TIBC, IRON, RETICCTPCT in the last 72 hours. Sepsis Labs: No results for input(s): PROCALCITON, LATICACIDVEN in the last 168 hours.  Recent Results (from the past 240 hour(s))  SARS Coronavirus 2 by RT PCR (hospital order, performed in [facility] hospital lab) Nasopharyngeal Nasopharyngeal Swab     Status: None   Collection Time: 11/07/19 10:55 AM   Specimen: Nasopharyngeal Swab  Result Value Ref Range Status   SARS Coronavirus 2 NEGATIVE NEGATIVE Final    Comment: (NOTE) SARS-CoV-2 target nucleic acids are NOT DETECTED. The SARS-CoV-2 RNA is generally detectable in upper and lower respiratory specimens during the acute phase of infection. The lowest concentration of SARS-CoV-2 viral  copies this assay can detect is 250 copies / mL. A negative result does not preclude SARS-CoV-2 infection and should not be used as the sole basis for treatment or other patient management decisions.  A negative result may occur with improper specimen collection / handling, submission of specimen other than nasopharyngeal swab, presence of viral mutation(s) within the areas targeted by this assay, and inadequate number of viral copies (<250 copies / mL). A negative result must be combined with clinical observations, patient history, and epidemiological information. Fact Sheet for Patients:   StrictlyIdeas.no Fact Sheet for Healthcare Providers: BankingDealers.co.za This test is not yet approved or cleared  by the [place] and has been authorized for detection and/or diagnosis of SARS-CoV-2 by FDA under an Emergency Use Authorization (EUA).  This EUA will remain in effect (meaning this test can be used) for the duration of the COVID-19 declaration under Section 564(b)(1) of the Act, 21 U.S.C. section 360bbb-3(b)(1), unless the authorization is terminated or revoked sooner. Performed at [facility], [place] [address] [place], [place] [address]   MRSA PCR Screening     Status: None   Collection Time: 11/12/19 11:25 AM   Specimen: Nasal Mucosa; Nasopharyngeal  Result Value Ref Range Status   MRSA by PCR NEGATIVE NEGATIVE Final    Comment:        The GeneXpert MRSA Assay (FDA approved for NASAL specimens only), is one component of a comprehensive MRSA colonization surveillance program. It is not intended to diagnose MRSA infection nor to guide or monitor treatment for MRSA infections. Performed at [facility], [place] [address], [place], [place] [address]          Radiology Studies: DG Chest 1 View  Result Date: 11/15/2019 CLINICAL DATA:  Shortness of breath. EXAM: CHEST  1 VIEW  COMPARISON:  11/12/2019 FINDINGS: Bilateral lung opacities on exam 3 days ago have near completely resolved. Slightly improved lung volumes from prior. Cardiomegaly. No new or progressive airspace disease. No pneumothorax or significant pleural effusion. IMPRESSION: 1. Near complete resolution of bilateral lung opacities since exam 3 days ago, favoring resolved pulmonary edema. Improved lung aeration from prior. No new abnormality. 2. Cardiomegaly. Electronically Signed   By: Keith Rake M.D.   On: 11/15/2019 15:35        Scheduled Meds: . Chlorhexidine Gluconate Cloth  6 each Topical Daily  . colchicine  0.6 mg Oral Daily  . docusate sodium  100 mg Oral BID  . escitalopram  20 mg Oral Daily  . furosemide  80 mg Intravenous BID  . gemfibrozil  600 mg Oral BID AC  . guaiFENesin  1,200 mg Oral BID  . heparin injection (subcutaneous)  5,000 Units Subcutaneous Q8H  . loratadine  10 mg Oral Daily  . mouth rinse  15 mL Mouth Rinse BID  . polyethylene glycol  17 g Oral Daily  . predniSONE  2.5 mg Oral Q breakfast  . traZODone  50 mg Oral QHS   Continuous Infusions:   LOS: 8 days     Georgette Shell, MD 11/16/2019, 8:45 AM

## 2019-11-16 NOTE — Progress Notes (Signed)
[facility] Progress Note  Subjective:  UOP 2.6L yesterday, intake down with fluid restriction. Wt 6/7 137.3kg today 128.6kg.  He is feeling pretty good --> states would be 95% baseline except for foot/ankle pain but that is improving some too.   Vitals:   11/15/19 1849 11/15/19 2222 11/16/19 0304 11/16/19 0308  BP: (!) 151/89 (!) 156/84 (!) 148/79   Pulse: 89 98 78   Resp: 18 16 16    Temp: 98.6 F (37 C) 97.9 F (36.6 C) 98.1 F (36.7 C)   TempSrc: Oral     SpO2: 96% 93% 93%   Weight:    128.6 kg  Height:        Exam: Gen awake and alert Lungs: normal WOB today, on RA now CV: RRR, no rub MS no joint effusions or deformity Ext 2+ diffuse pitting foot edema but finally some improvement - 1+ tibial now and foot with a few wrinkles now Neuro is alert, Ox 3 , nf    Home meds:  - lisinopril 10 qd/ zaroxolyn 5 qd/ torsemide 100 qd/ kcl 20 meq qid  - tacrolimus 5 bid/ prednisone 5mg  biw  - lopid 600 bid/ norco qid prn/ lexapro 20 qd  - prn's/ vitamins/ supplements    UA 6/3 - clear , 0-5 rbc/ wbc, rare bact  UNa 53, UCr 96    Assessment/ Plan: 1. Nephrotic syndrome secondary to FSGS (bx 76/1607) complicated by AKI:  suspect this is hemodynamically mediated injury with resultant ATN - low oncotic pressure, poor po intake/hypovolemia (prior to admission, wasn't ambulating due to pain and thus PO intake way down), ACEi, tacrolimus. Thankfully renal function cont to improve today.  Will continue to avoid nephrotoxins, insults such as hypotension and hopefully will see additional improvement.  Cont to hold acei but will resume when renal function normalizes. 2. Volume overload: needs ongoing diuresis - lasix 80 IV BID today.   Low na diet, fluid restriction to 1885mL added 6/11.  I hope to be able to switch to po diuretics tomorrow. 3. Bilat leg pain - seem out of proportion to symptoms one would expect from straight forward neph syndrome/ LE edema.  No DVT.   Suspect polyarticular gout precipitated by CNI initiation with uric acid > 10; rec'd burst of steroids and colchicine now (adjusted for renal function).  Seems to be slowly improving. 4. Ruptured Baker's cyst / cellulitis - per primary, on cefdinir.  5. HL 6. Obesity 7.  HTN:  Improving with diuresis.  8. Insomnia: prn trazodone .  9.  Hyperuricemia with presumed gout: per above.  Poss CNI related.  Will need uric acid level followed with additional therapy rx'd after acute phase if uric acid remains elevated.    Call with questions   Jannifer Hick MD [facility] Pager [phone]   Recent Labs  Lab 11/13/19 [id number] 11/13/19 7035 11/14/19 0522 11/14/19 0522 11/15/19 0302 11/16/19 0528  K 4.3   < > 4.2   < > 4.2 3.9  BUN 97*   < > 97*   < > 79* 67*  CREATININE 2.44*   < > 2.00*  1.98*   < > 1.77* 1.75*  CALCIUM 8.7*   < > 9.0   < > 8.9 9.1  PHOS 4.9*  --  4.8*  --   --   --   HGB  --   --  11.6*  --   --  11.0*   < > = values in this interval  not displayed.   Inpatient medications: . Chlorhexidine Gluconate Cloth  6 each Topical Daily  . colchicine  0.6 mg Oral Daily  . docusate sodium  100 mg Oral BID  . escitalopram  20 mg Oral Daily  . furosemide  80 mg Intravenous BID  . gemfibrozil  600 mg Oral BID AC  . guaiFENesin  1,200 mg Oral BID  . heparin injection (subcutaneous)  5,000 Units Subcutaneous Q8H  . loratadine  10 mg Oral Daily  . mouth rinse  15 mL Mouth Rinse BID  . polyethylene glycol  17 g Oral Daily  . predniSONE  2.5 mg Oral Q breakfast  . traZODone  50 mg Oral QHS    acetaminophen **OR** acetaminophen, albuterol, chlorpheniramine-HYDROcodone, labetalol, naLOXone (NARCAN)  injection **AND** sodium chloride flush, ondansetron **OR** ondansetron (ZOFRAN) IV, oxyCODONE-acetaminophen

## 2019-11-17 LAB — BASIC METABOLIC PANEL
Anion gap: 11 (ref 5–15)
BUN: 56 mg/dL — ABNORMAL HIGH (ref 6–20)
CO2: 31 mmol/L (ref 22–32)
Calcium: 8.9 mg/dL (ref 8.9–10.3)
Chloride: 98 mmol/L (ref 98–111)
Creatinine, Ser: 1.58 mg/dL — ABNORMAL HIGH (ref 0.61–1.24)
GFR calc Af Amer: 56 mL/min — ABNORMAL LOW (ref >60)
GFR calc non Af Amer: 48 mL/min — ABNORMAL LOW (ref >60)
Glucose, Bld: 97 mg/dL (ref 70–99)
Potassium: 4 mmol/L (ref 3.5–5.1)
Sodium: 140 mmol/L (ref 135–145)

## 2019-11-17 MED ORDER — TORSEMIDE 100 MG PO TABS
100.0000 mg | ORAL_TABLET | Freq: Every day | ORAL | Status: DC
  Administered 2019-11-18 – 2019-11-19 (×2): 100 mg via ORAL
  Filled 2019-11-17 (×2): qty 1

## 2019-11-17 NOTE — Progress Notes (Signed)
Occupational Therapy Treatment Patient Details Name: Darren Hamilton MRN: [id number] DOB: 1961-05-08 Today's Date: 11/17/2019    History of present illness 59 y.o. male with a history of hypertension, hyperlipidemia, asthma, anxiety, & nephrotic syndrome who comes to the ED 6/8  with complaints of worsening pain/swelling to the left lower extremity, unable to bear weight..Patient was seen in the emergency department 11/05/2019 for evaluation of similar symptoms, had left knee x-ray that was negative and LLE venous duplex that was negative for DVT, however did reveal ruptured baker's cyst. He was discharged home with norco .   OT comments  This 59 yo male admitted with above making progress today with standing at sink to complete one grooming task, ambulating in room with RW, transferring to bedside commode (now can also ambulate to bathroom). He reports wife will assist him with LBADLs as needed. He will continue to benefit from acute OT with follow up [place].   Follow Up Recommendations  Home health OT;Supervision/Assistance - 24 hour    Equipment Recommendations  3 in 1 bedside commode;Tub/shower seat    \   Precautions / Restrictions Precautions Precautions: Fall Restrictions Weight Bearing Restrictions: No       Mobility Bed Mobility Overal bed mobility: Modified Independent Bed Mobility: Supine to Sit;Sit to Supine     Supine to sit: Modified independent (Device/Increase time);HOB elevated Sit to supine: Modified independent (Device/Increase time)    Transfers Overall transfer level: Needs assistance Equipment used: Rolling walker (2 wheeled) Transfers: Sit to/from Stand Sit to Stand: Min guard         General transfer comment: cues for hand placement     Balance Overall balance assessment: Needs assistance Sitting-balance support: No upper extremity supported;Feet supported Sitting balance-Leahy Scale: Good     Standing balance support: Bilateral upper  extremity supported;During functional activity;No upper extremity supported Standing balance-Leahy Scale: Poor Standing balance comment: Able to stand without Bil UE support to put toothpaste on toothbrush, but then felt his legs were very wobbly and so he propped Bil forearms on sink to brush teeth                           ADL either performed or assessed with clinical judgement   ADL Overall ADL's : Needs assistance/impaired     Grooming: Oral care;Standing Grooming Details (indicate cue type and reason): min guard A (pt propping with Bil forearms for part of task)             Lower Body Dressing: Minimal assistance;Sit to/from stand Lower Body Dressing Details (indicate cue type and reason): Still cannot get right sock on, but reports his wife will A him with this Toilet Transfer: Minimal assistance;Ambulation;RW Toilet Transfer Details (indicate cue type and reason): bed>sink to brush teeth, to door, to bed            We talked about stepping backwards in to tub with use of RW to help support him as he steps into and out of tub. Good leg in first and coming out bad leg out first.     Vision Patient Visual Report: No change from baseline            Cognition Arousal/Alertness: Awake/alert Behavior During Therapy: WFL for tasks assessed/performed Overall Cognitive Status: Within Functional Limits for tasks assessed  Pertinent Vitals/ Pain       Pain Assessment: 0-10 Pain Score:  (7 in LLE , 5 in LLE) Pain Location: left and right Pain Descriptors / Indicators: Aching;Sore Pain Intervention(s): Limited activity within patient's tolerance;Monitored during session;Repositioned         Frequency  Min 2X/week        Progress Toward Goals  OT Goals(current goals can now be found in the care plan section)  Progress towards OT goals: Progressing toward goals  Acute Rehab OT  Goals Patient Stated Goal: walk  Plan Frequency remains appropriate;Discharge plan needs to be updated       AM-PAC OT "6 Clicks" Daily Activity     Outcome Measure   Help from another person eating meals?: None Help from another person taking care of personal grooming?: A Little Help from another person toileting, which includes using toliet, bedpan, or urinal?: A Little Help from another person bathing (including washing, rinsing, drying)?: A Little Help from another person to put on and taking off regular upper body clothing?: A Little Help from another person to put on and taking off regular lower body clothing?: A Lot 6 Click Score: 18    End of Session Equipment Utilized During Treatment: Rolling walker  OT Visit Diagnosis: Pain;Muscle weakness (generalized) (M62.81) Pain - Right/Left:  (both (L>RLE)) Pain - part of body: Leg   Activity Tolerance Patient limited by pain (but not as much as he has been)   Patient Left in bed;with call bell/phone within reach;with bed alarm set   Nurse Communication Mobility status (pt ambulating well enougth to be walking to bathroom with staff)        Time: 0865-7846 OT Time Calculation (min): 29 min  Charges: OT General Charges $OT Visit: 1 Visit OT Treatments $Self Care/Home Management : 23-37 mins  Ignacia Palma, OTR/L Acute [employer] Pager [phone] Office [phone]      Evette Georges 11/17/2019, 4:45 PM

## 2019-11-17 NOTE — Progress Notes (Signed)
PROGRESS NOTE    Darren Hamilton  ZRA:[id number] DOB: June 17, 1960 DOA: 11/07/2019 PCP: Eunice Blase, MD    Brief Narrative: Patient is a 59 year old male with history of hypertension, CKD stage III with nephrotic syndrome, asthma, anxiety,recent history of ruptured Baker's cyst in his left lower extremity who presented with complaints of pain and swelling on the left lower extremity which worsened with ambulation. Patient was evaluated by his PCP for enrollment in PT program but pain got worse with weightbearing and range of motion on the left ankle. He also reported poor oral intake for 3 days due to pain and poor ambulatory dysfunction. In the emergency department, he was found to have elevated ESR, CRP concerning for infectious process. His CT imaging of the left lower extremity showed subcutaneous edema concerning for cellulitis. Venous duplex of the left lower extremity showed ruptured Baker's cyst, no DVT. Patient was admitted for the management of cellulitis secondary to ruptured Baker's cyst. Hospital course complicated by worsening renal function prompted nephrology evaluation. He also developed anasarca in the setting of aggressive IV hydration and hypoalbuminemia. He has been started on IV Lasix/albumin. Nephrology following. Overall status is improving. Assessment & Plan:   Principal Problem:   Cellulitis of left lower extremity without foot Active Problems:   Depression   Seasonal allergies   Hyperlipidemia   Essential hypertension   Nephrotic syndrome   Cellulitis   Acute hypoxic respiratory failure:resolved .Marland Kitchen Secondary to acute diastolic congestive heart failure, volume overload due to renal failure.  Chest x-ray showed vascular congestion with interstitial prominence and patchy bibasilar consolidation, small bilateral pleural effusions.  Currently on Lasix IV,he was also given albumin IV. Oxygen tapered down to room air. Chest x-ray 11/15/2019 with near  complete resolution of bilateral lung opacities favoring resolved pulmonary edema and improved lung aeration with no new abnormality. Negative by 13.2 L. Still with volume overload on iv diuresis   Altered mental status/metabolic encephalopathy: Improving.   He reported using CPAP at night.  On trazodone for insomnia.    AKI:Has history of nephrotic syndrome. Biopsy showed FSGS in 04/2019. He is on Prograf at home. He follows with Dr. Posey Pronto, nephrology. Nephrology following here. Currently on Lasix 80 mg IVtwice daily with albumin, .lisinopril, Demadex, metoprolol on hold. Kidney function improving.Crin the range of 1.58today. Remains on IV diuresis.  Appears volume overloaded still.  Cellulitis of the left lower extremity/ruptured Baker's cyst: Presented with left lower extremity pain, swelling mainly on foot. Elevated ESR, CRP. CT showed edema suggesting cellulitis versus post Baker's cyst rupture inflammation. Antibiotics changed to oral and and he finished the course there is no findings of myositis or compartment syndrome or abscess. Seen by PT recommends home health physical therapy on discharge. Patient still finding difficulty to walk or ambulate in the room or in the hallway  Intractable pain: Suspected to be from ruptured Baker's cyst and acute gout.Doppler ultrasound negative for DVT. He was complaining of left lower extremity pain on presentation but developed right lower extremity pain during this hospitalization. Seen by Ortho also. Found to have elevated uric acid level. Started on colchicine,low dose prednisone. Continue pain management.Continues to complain of pain on bilateral ankles. Continue supportive care. He does not have any documented history of gout. He was startedon colchicine because uric acid was high and he was complaining of bilateral ankle pain. He can follow-up with rheumatology as an outpatient.  Hyperlipidemia: On  gemfibrozil  Hypertension:bp 152/113 prior to meds .  Continue current medications.  Obesity:BMI of 44.3. Patient seen by physical therapy and recommended home health PT on discharge.  Estimated body mass index is 42.24 kg/m as calculated from the following:   Height as of this encounter: _0  (1.727 m).   Weight as of this encounter: 126 kg. DVT prophylaxis: Subcu heparin Code Status:Full code Family Communication: Discussed with patient. Status is: Inpatient  Remains inpatient appropriate because:IV treatments appropriate due to intensity of illness IV diuresis. Dispo: The patient is from: Home Anticipated d/c is to: Home Anticipated d/c date is:1 to 2 days Patient currently is not medically stable to d/c. Still appears volume overloaded needing Lasix. . Needs to be cleared by nephrology before discharge.   Consultants:Nephrology, PCCM  Procedures: None  Antimicrobials:  Subjective: Resting in bed did not sleep well Tried walking yesterday Pain in the lower extremities better  Objective: Vitals:   11/16/19 1409 11/16/19 2101 11/17/19 0541 11/17/19 0605  BP: (!) 143/101 (!) 151/92 (!) 152/113   Pulse: (!) 106 92 85   Resp: _1 Temp: 98.3 F (36.8 C) 98.4 F (36.9 C) 98.2 F (36.8 C)   TempSrc: Oral Oral Oral   SpO2: 92% 95% 94%   Weight:    126 kg  Height:        Intake/Output Summary (Last 24 hours) at 11/17/2019 0804 Last data filed at 11/17/2019 1540 Gross per 24 hour  Intake 1200 ml  Output 2850 ml  Net -1650 ml   Filed Weights   11/15/19 0500 11/16/19 0308 11/17/19 0605  Weight: 128.9 kg 128.6 kg 126 kg    Examination:  General exam: Appears calm and comfortable  Respiratory system: Decreased breath sounds at the bases to auscultation. Respiratory effort normal. Cardiovascular system: S1 & S2 heard, RRR. No JVD, murmurs, rubs, gallops or clicks. No pedal edema. Gastrointestinal  system: Abdomen is nondistended, soft and nontender. No organomegaly or masses felt. Normal bowel sounds heard. Central nervous system: Alert and oriented. No focal neurological deficits. Extremities: 2+ pitting edema left more than right Skin: No rashes, lesions or ulcers Psychiatry: Judgement and insight appear normal. Mood & affect appropriate.     Data Reviewed: I have personally reviewed following labs and imaging studies  CBC: Recent Labs  Lab 11/11/19 0536 11/14/19 0522 11/16/19 0528  WBC 9.1 8.8 11.4*  NEUTROABS  --  5.4 7.9*  HGB 10.1* 11.6* 11.0*  HCT 29.7* 35.2* 33.5*  MCV 90.3 92.6 93.3  PLT 390 450* 086*   Basic Metabolic Panel: Recent Labs  Lab 11/12/19 0729 11/12/19 0729 11/13/19 0648 11/14/19 0522 11/15/19 0302 11/16/19 0528 11/17/19 0625  NA 137   < > 138 139 138 140 140  K 4.6   < > 4.3 4.2 4.2 3.9 4.0  CL 100   < > 100 98 97* 98 98  CO2 22   < > _2 GLUCOSE 111*   < > 105* 112* 117* 98 97  BUN 107*   < > 97* 97* 79* 67* 56*  CREATININE 3.16*   < > 2.44* 2.00*   1.98* 1.77* 1.75* 1.58*  CALCIUM 8.8*   < > 8.7* 9.0 8.9 9.1 8.9  PHOS 6.3*  --  4.9* 4.8*  --   --   --    < > = values in this interval not displayed.   GFR: Estimated Creatinine Clearance: 67.5 mL/min (A) (by C-G formula based on SCr of 1.58 mg/dL (H)). Liver Function Tests: Recent Labs  Lab 11/12/19 0729 11/13/19 0648 11/14/19 0522  ALBUMIN 2.6* 2.7* 2.6*   No results for input(s): LIPASE, AMYLASE in the last 168 hours. No results for input(s): AMMONIA in the last 168 hours. Coagulation Profile: No results for input(s): INR, PROTIME in the last 168 hours. Cardiac Enzymes: No results for input(s): CKTOTAL, CKMB, CKMBINDEX, TROPONINI in the last 168 hours. BNP (last 3 results) No results for input(s): PROBNP in the last 8760 hours. HbA1C: No results for input(s): HGBA1C in the last 72 hours. CBG: No results for input(s): GLUCAP in the last 168 hours. Lipid  Profile: No results for input(s): CHOL, HDL, LDLCALC, TRIG, CHOLHDL, LDLDIRECT in the last 72 hours. Thyroid Function Tests: No results for input(s): TSH, T4TOTAL, FREET4, T3FREE, THYROIDAB in the last 72 hours. Anemia Panel: No results for input(s): VITAMINB12, FOLATE, FERRITIN, TIBC, IRON, RETICCTPCT in the last 72 hours. Sepsis Labs: No results for input(s): PROCALCITON, LATICACIDVEN in the last 168 hours.  Recent Results (from the past 240 hour(s))  SARS Coronavirus 2 by RT PCR (hospital order, performed in [facility] hospital lab) Nasopharyngeal Nasopharyngeal Swab     Status: None   Collection Time: 11/07/19 10:55 AM   Specimen: Nasopharyngeal Swab  Result Value Ref Range Status   SARS Coronavirus 2 NEGATIVE NEGATIVE Final    Comment: (NOTE) SARS-CoV-2 target nucleic acids are NOT DETECTED. The SARS-CoV-2 RNA is generally detectable in upper and lower respiratory specimens during the acute phase of infection. The lowest concentration of SARS-CoV-2 viral copies this assay can detect is 250 copies / mL. A negative result does not preclude SARS-CoV-2 infection and should not be used as the sole basis for treatment or other patient management decisions.  A negative result may occur with improper specimen collection / handling, submission of specimen other than nasopharyngeal swab, presence of viral mutation(s) within the areas targeted by this assay, and inadequate number of viral copies (<250 copies / mL). A negative result must be combined with clinical observations, patient history, and epidemiological information. Fact Sheet for Patients:   StrictlyIdeas.no Fact Sheet for Healthcare Providers: BankingDealers.co.za This test is not yet approved or cleared  by the [place] FDA and has been authorized for detection and/or diagnosis of SARS-CoV-2 by FDA under an Emergency Use Authorization (EUA).  This EUA will remain in effect  (meaning this test can be used) for the duration of the COVID-19 declaration under Section 564(b)(1) of the Act, 21 U.S.C. section 360bbb-3(b)(1), unless the authorization is terminated or revoked sooner. Performed at [facility], [place] [address] [place], [place] [address]   MRSA PCR Screening     Status: None   Collection Time: 11/12/19 11:25 AM   Specimen: Nasal Mucosa; Nasopharyngeal  Result Value Ref Range Status   MRSA by PCR NEGATIVE NEGATIVE Final    Comment:        The GeneXpert MRSA Assay (FDA approved for NASAL specimens only), is one component of a comprehensive MRSA colonization surveillance program. It is not intended to diagnose MRSA infection nor to guide or monitor treatment for MRSA infections. Performed at [facility], [place] [address] [place], [place] [address]          Radiology Studies: DG Chest 1 View  Result Date: 11/15/2019 CLINICAL DATA:  Shortness of breath. EXAM: CHEST  1 VIEW COMPARISON:  11/12/2019 FINDINGS: Bilateral lung opacities on exam 3 days ago have near completely resolved. Slightly improved lung volumes from prior. Cardiomegaly. No new or progressive airspace  disease. No pneumothorax or significant pleural effusion. IMPRESSION: 1. Near complete resolution of bilateral lung opacities since exam 3 days ago, favoring resolved pulmonary edema. Improved lung aeration from prior. No new abnormality. 2. Cardiomegaly. Electronically Signed   By: Keith Rake M.D.   On: 11/15/2019 15:35        Scheduled Meds:  Chlorhexidine Gluconate Cloth  6 each Topical Daily   colchicine  0.6 mg Oral Daily   docusate sodium  100 mg Oral BID   escitalopram  20 mg Oral Daily   furosemide  80 mg Intravenous BID   gemfibrozil  600 mg Oral BID AC   guaiFENesin  1,200 mg Oral BID   heparin injection (subcutaneous)  5,000 Units Subcutaneous Q8H   loratadine  10 mg Oral Daily   mouth rinse  15 mL Mouth Rinse  BID   polyethylene glycol  17 g Oral Daily   predniSONE  2.5 mg Oral Q breakfast   traZODone  50 mg Oral QHS   Continuous Infusions:   LOS: 9 days     Georgette Shell, MD  11/17/2019, 8:04 AM

## 2019-11-17 NOTE — Progress Notes (Signed)
[facility] Progress Note  Subjective:  UOP 2.9L yesterday, intake down with fluid restriction. Wt 6/7 137.3kg today 126kg.  Feeling better - weight bearing some now.    Vitals:   11/16/19 1409 11/16/19 2101 11/17/19 0541 11/17/19 0605  BP: (!) 143/101 (!) 151/92 (!) 152/113   Pulse: (!) 106 92 85   Resp: 18 20 16    Temp: 98.3 F (36.8 C) 98.4 F (36.9 C) 98.2 F (36.8 C)   TempSrc: Oral Oral Oral   SpO2: 92% 95% 94%   Weight:    126 kg  Height:        Exam: Gen awake and alert Lungs: normal WOB today, on RA now CV: RRR, no rub MS no joint effusions or deformity Ext 2+ pitting L foot still, R foot 1+ --> overall improved through the week but still has several lbs of fluid left Neuro is alert, Ox 3 , nf    Home meds:  - lisinopril 10 qd/ zaroxolyn 5 qd/ torsemide 100 qd/ kcl 20 meq qid  - tacrolimus 5 bid/ prednisone 5mg  biw  - lopid 600 bid/ norco qid prn/ lexapro 20 qd  - prn's/ vitamins/ supplements    UA 6/3 - clear , 0-5 rbc/ wbc, rare bact  UNa 53, UCr 96    Assessment/ Plan: 1. Nephrotic syndrome secondary to FSGS (bx 03/2019) complicated by AKI:  suspect this is hemodynamically mediated injury with resultant ATN - low oncotic pressure, poor po intake/hypovolemia (prior to admission, wasn't ambulating due to pain and thus PO intake way down), ACEi, tacrolimus. Thankfully renal function cont to improve today.  Cont to hold acei but would plan to resume at discharge.   His UP/C 1.8 currently down from ~7 when checked at clinic. With his UP/C down, I will hold on additional FSGS therapy and allow f/u with his primary nephrologist Dr. 8/3 who had already mentioned possibility of rituxan in clinic notes.   2. Volume overload: needs ongoing diuresis - lasix 80 IV BID today then switch to torsemide 100 daily tomorrow.   Low na diet, fluid restriction to 04/2019 added 6/11.   3. Bilat leg pain - seem out of proportion to symptoms one would expect from  straight forward neph syndrome/ LE edema.  No DVT.  Suspect polyarticular gout precipitated by CNI initiation with uric acid > 10; rec'd burst of steroids and colchicine now (adjusted for renal function).  Seems to be slowly improving. 4. Ruptured Baker's cyst / cellulitis - per primary, on cefdinir.  5. HL 6. Obesity 7.  HTN:  Improving with diuresis.  8. Insomnia: prn trazodone .  9.  Hyperuricemia with presumed gout: per above.  Poss CNI related.  Will need uric acid level followed with additional therapy rx'd after acute phase if uric acid remains elevated.    Call with questions   MD [facility] Pager [phone]   Recent Labs  Lab 11/13/19 [phone] 11/13/19 8469 11/14/19 0522 11/15/19 0302 11/16/19 0528 11/17/19 0625  K 4.3   < > 4.2   < > 3.9 4.0  BUN 97*   < > 97*   < > 67* 56*  CREATININE 2.44*   < > 2.00*  1.98*   < > 1.75* 1.58*  CALCIUM 8.7*   < > 9.0   < > 9.1 8.9  PHOS 4.9*  --  4.8*  --   --   --   HGB  --   --  11.6*  --  11.0*  --    < > = values in this interval not displayed.   Inpatient medications: . Chlorhexidine Gluconate Cloth  6 each Topical Daily  . colchicine  0.6 mg Oral Daily  . docusate sodium  100 mg Oral BID  . escitalopram  20 mg Oral Daily  . furosemide  80 mg Intravenous BID  . gemfibrozil  600 mg Oral BID AC  . guaiFENesin  1,200 mg Oral BID  . heparin injection (subcutaneous)  5,000 Units Subcutaneous Q8H  . loratadine  10 mg Oral Daily  . mouth rinse  15 mL Mouth Rinse BID  . polyethylene glycol  17 g Oral Daily  . predniSONE  2.5 mg Oral Q breakfast  . traZODone  50 mg Oral QHS    acetaminophen **OR** acetaminophen, albuterol, chlorpheniramine-HYDROcodone, labetalol, naLOXone (NARCAN)  injection **AND** sodium chloride flush, ondansetron **OR** ondansetron (ZOFRAN) IV, oxyCODONE-acetaminophen

## 2019-11-17 NOTE — Progress Notes (Signed)
Physical Therapy Treatment Patient Details Name: Darren Hamilton MRN: [id number] DOB: Nov 16, 1960 Today's Date: 11/17/2019    History of Present Illness 59 y.o. male with a history of hypertension, hyperlipidemia, asthma, anxiety, & nephrotic syndrome who comes to the ED 6/8  with complaints of worsening pain/swelling to the left lower extremity, unable to bear weight..Patient was seen in the emergency department 11/05/2019 for evaluation of similar symptoms, had left knee x-ray that was negative and LLE venous duplex that was negative for DVT, however did reveal ruptured baker's cyst. He was discharged home with norco .    PT Comments    RN called PT d/t MD requesting PT to return to see pt for possible d/c tomorrow.   Pt agreeable to OOB/amb in room, preferred not to amb into hallway d/t fatigue (just amb to bathroom with OT earlier).  Pt  with much improved gait overall, able to WB LLE with decr reliance on UEs. incr distance tolerated compared to last PT session. Will continue to follow in acute setting.   Follow Up Recommendations  Home health PT     Equipment Recommendations  3in1 (PT)    Recommendations for Other Services       Precautions / Restrictions Precautions Precautions: Fall Restrictions Weight Bearing Restrictions: No    Mobility  Bed Mobility Overal bed mobility: Needs Assistance Bed Mobility: Supine to Sit;Sit to Supine     Supine to sit: Supervision Sit to supine: Supervision   General bed mobility comments: for safety, dizzy on initial standing  Transfers Overall transfer level: Needs assistance Equipment used: Rolling walker (2 wheeled) Transfers: Sit to/from Stand Sit to Stand: Supervision         General transfer comment: cues for hand placement   Ambulation/Gait Ambulation/Gait assistance: Min guard;Supervision Gait Distance (Feet): 32 Feet (in room) Assistive device: Rolling walker (2 wheeled) Gait Pattern/deviations: Step-to  pattern;Decreased stance time - left     General Gait Details: amb in room, pt with improved ability to WB on LLE   Stairs             Wheelchair Mobility    Modified Rankin (Stroke Patients Only)       Balance     Sitting balance-Leahy Scale: Good     Standing balance support: Single extremity supported;No upper extremity supported   Standing balance comment: fair static, reliant on UEs  for dynamic                             Cognition Arousal/Alertness: Awake/alert Behavior During Therapy: WFL for tasks assessed/performed Overall Cognitive Status: Within Functional Limits for tasks assessed                                        Exercises      General Comments        Pertinent Vitals/Pain Pain Assessment: 0-10 Pain Score: 2  Pain Location: left foot Pain Descriptors / Indicators: Throbbing Pain Intervention(s): Limited activity within patient's tolerance;Monitored during session    Home Living                      Prior Function            PT Goals (current goals can now be found in the care plan section) Acute Rehab PT Goals Patient Stated Goal: walk  PT Goal Formulation: With patient Time For Goal Achievement: 11/15/19 Potential to Achieve Goals: Good Progress towards PT goals: Progressing toward goals    Frequency    Min 3X/week      PT Plan Current plan remains appropriate    Co-evaluation              AM-PAC PT "6 Clicks" Mobility   Outcome Measure  Help needed turning from your back to your side while in a flat bed without using bedrails?: None Help needed moving from lying on your back to sitting on the side of a flat bed without using bedrails?: None Help needed moving to and from a bed to a chair (including a wheelchair)?: A Little Help needed standing up from a chair using your arms (e.g., wheelchair or bedside chair)?: A Little Help needed to walk in hospital room?: A  Little Help needed climbing 3-5 steps with a railing? : A Little 6 Click Score: 20    End of Session   Activity Tolerance: Patient tolerated treatment well Patient left: in bed;with call bell/phone within reach;with bed alarm set Nurse Communication: Mobility status PT Visit Diagnosis: Other abnormalities of gait and mobility (R26.89);Pain Pain - Right/Left: Left Pain - part of body: Ankle and joints of foot     Time: 1520-1540 PT Time Calculation (min) (ACUTE ONLY): 20 min  Charges:  $Gait Training: 8-22 mins                     Baxter Flattery, PT  Acute Rehab Dept ([place]) [phone] Pager [phone]  11/17/2019    [facility] 11/17/2019, 4:03 PM

## 2019-11-18 NOTE — Progress Notes (Signed)
[facility] Progress Note  Subjective: no new c/o's, walking in halls w/ P.T.  Creat down to 1.5. 1000 cc UOP yesterday. No wt's this am. Now on po torsemide.   Vitals:   11/17/19 1338 11/17/19 2028 11/18/19 0654 11/18/19 1353  BP: (!) 154/99 (!) 130/94 (!) 152/83 (!) 154/92  Pulse: 98 98 85 (!) 102  Resp: 17 20 16 16   Temp: 98.3 F (36.8 C) 98.9 F (37.2 C) 98.2 F (36.8 C) 98.2 F (36.8 C)  TempSrc: Oral Oral Oral Oral  SpO2: 95% 95% 95% 91%  Weight:      Height:        Exam: Gen awake and alert Lungs: normal WOB today, on RA now CV: RRR, no rub MS no joint effusions or deformity Ext 1+ bilat pretib edema now, much improved Neuro is alert, Ox 3 , nf   Home meds: - lisinopril 10 qd/ zaroxolyn 5 qd/ torsemide 100 qd/ kcl 20 meq qid - tacrolimus 5 bid/ prednisone 5mg  biw - lopid 600 bid/ norco qid prn/ lexapro 20 qd - prn's/ vitamins/ supplements  UA 6/3 - clear , 0-5 rbc/ wbc, rare bact UNa 53, UCr 96    Assessment/ Plan: 1. Nephrotic syndrome secondary to FSGS (bx 00/9381) complicated by AKI:  suspect this is hemodynamically mediated injury with resultant ATN - low oncotic pressure, poor po intake/hypovolemia (prior to admission, wasn't ambulating due to pain and thus PO intake way down), ACEi, tacrolimus. Renal function contiinues to improve today. Creat down to 1.5, peak creat was 2.89. Baseline creat per Dr Posey Pronto is 1.3- 1.4.   - AKI resolved, creat down 1.5   - diuresis completed for now, back on po torsemide at 100 mg /d   - please resume his lisinopril 10 mg qd upon dc home   - proteinuria at this time is 1.8, down from ~7 when checked at clinic.   With the proteinuria down we will hold on additional FSGS therapy   and allow f/u with his primary nephrologist Dr. Posey Pronto who had already   mentioned possibility of rituxan in clinic notes. Cont to hold prograf at   discharge.     - pt has already f/u appt on July 12th at [facility]    - have  scheduled lab visit for 6/28, see F/U visits section in D/C area   - no further suggestions, will sign off  2. Volume overload: needs ongoing diuresis - lasix 80 IV BID today then switch to torsemide 100 daily tomorrow.   Low na diet, fluid restriction to 182mL added 6/11.   3. Bilat leg pain - seem out of proportion to symptoms one would expect from straight forward neph syndrome/ LE edema. No DVT.  Suspect polyarticular gout precipitated by CNI initiation with uric acid > 10; rec'd burst of steroids and colchicine now (adjusted for renal function).  Seems to be slowly improving. 4. Ruptured Baker's cyst / cellulitis - per primary, on cefdinir.  5. HL 6. Obesity 7.  HTN:  Improving with diuresis.  8. Insomnia: prn trazodone .  9.  Hyperuricemia with presumed gout: per above.  Poss CNI related.  Will need uric acid level followed with additional therapy rx'd after acute phase if uric acid remains elevated.       Rob Demaris Bousquet 11/18/2019, 4:17 PM   Recent Labs  Lab 11/13/19 0648 11/13/19 0648 11/14/19 0522 11/15/19 0302 11/16/19 0528 11/17/19 0625  K 4.3   < > 4.2   < >  3.9 4.0  BUN 97*   < > 97*   < > 67* 56*  CREATININE 2.44*   < > 2.00*  1.98*   < > 1.75* 1.58*  CALCIUM 8.7*   < > 9.0   < > 9.1 8.9  PHOS 4.9*  --  4.8*  --   --   --   HGB  --   --  11.6*  --  11.0*  --    < > = values in this interval not displayed.   Inpatient medications: . Chlorhexidine Gluconate Cloth  6 each Topical Daily  . colchicine  0.6 mg Oral Daily  . docusate sodium  100 mg Oral BID  . escitalopram  20 mg Oral Daily  . gemfibrozil  600 mg Oral BID AC  . guaiFENesin  1,200 mg Oral BID  . heparin injection (subcutaneous)  5,000 Units Subcutaneous Q8H  . loratadine  10 mg Oral Daily  . mouth rinse  15 mL Mouth Rinse BID  . polyethylene glycol  17 g Oral Daily  . predniSONE  2.5 mg Oral Q breakfast  . torsemide  100 mg Oral Daily  . traZODone  50 mg Oral QHS    acetaminophen **OR**  acetaminophen, albuterol, chlorpheniramine-HYDROcodone, labetalol, naLOXone (NARCAN)  injection **AND** sodium chloride flush, ondansetron **OR** ondansetron (ZOFRAN) IV, oxyCODONE-acetaminophen

## 2019-11-18 NOTE — Progress Notes (Signed)
PROGRESS NOTE    Darren Hamilton  YDX:[id number] DOB: 1960-10-16 DOA: 11/07/2019 PCP: Eunice Blase, MD    Chief Complaint  Patient presents with  . Leg Swelling  . 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 Jackquline Berlin was started on IV Lasix and IV albumin Prograf was held  Assessment & Plan:   Principal Problem:   Cellulitis of left lower extremity without foot Active Problems:   Depression   Seasonal allergies   Hyperlipidemia   Essential hypertension   Nephrotic syndrome   Cellulitis   1. Hypoxic respiratory failure secondary to acute diastolic heart failure with volume overload contribution from nephrotic syndrome a. Improving and now not using any supplementary oxygen b. Will require follow-up x-ray 2 weeks from discharge to ensure clearing of infiltrates on admission c. Coughing today 2. Toxic metabolic encephalopathy encephalopathy a. Completely resolved at this time 3. AKI superimposed on nephrotic syndrome a. No labs drawn today-at this time medications are Demadex 100 daily, prednisone 2.5 daily as directed by nephrology b. Net fluid balance is -12.6 weight is down from admission 132 kg to 126 c. He will need outpatient follow-up and discussion about further options with Dr. Posey Pronto 4. Left lower extremity cellulitis/ruptured Baker's cyst + probable gout versus other polyarthralgia? a. Patient may have overexerted himself  6/13-continue Percocet 2 tabs every 3 as needed pain b. Continue colchicine 0.6-he tells me that he did not have any of his joints tapped so diagnosis remains empiric c. If no resolution would consider rheumatological referral/work-up on follow-up with his nephrologist 5. Intractable pain a. See above  6. BMI >44 7. HTN a. Continue labetalol 10 every 2 as needed-on admission held lisinopril 10 daily and would defer to nephrology further planning-may be able to use lower dose ACE and titrate as OP once fluid overload better? 8. Hyperlipidemia a. Continue gemfibrozil 600 daily    DVT prophylaxis:  Code Status:  Family Communication: Disposition:   Status is: Inpatient  Remains inpatient appropriate because:Ongoing active pain requiring inpatient pain management, Ongoing diagnostic testing needed not appropriate for outpatient work up and IV treatments appropriate due to intensity of illness or inability to take PO   Dispo: The patient is from: Home              Anticipated d/c is to: Home              Anticipated d/c date is: 1 day              Patient currently is not medically stable to d/c.    Consultants:   Nephrology  Orthopedics  Procedures: Multiple  Antimicrobials:  Cephalosporin from 6/3 until 6/11   Subjective: Awake alert coherent no distress does have a cough today however no chest pain no fever no chills no nausea no vomiting Tells me that the pain in his left lower extremity is mainly around the ankle now and he does not have any knee pain He has some swelling but states that his left leg is much less  swollen than it was when he came to the hospital   Objective: Vitals:   11/17/19 0605 11/17/19 1338 11/17/19 2028 11/18/19 0654  BP:  (!) 154/99 (!) 130/94 (!) 152/83  Pulse:  98 98 85  Resp:  17 20 16   Temp:  98.3 F (36.8 C) 98.9 F (37.2 C) 98.2 F (36.8 C)  TempSrc:  Oral Oral Oral  SpO2:  95% 95% 95%  Weight: 126 kg     Height:         Intake/Output Summary (Last 24 hours) at 11/18/2019 1028 Last data filed at 11/18/2019 0650 Gross per 24 hour  Intake 1200 ml  Output 1125 ml  Net 75 ml   Filed Weights   11/15/19 0500 11/16/19 0308 11/17/19 0605  Weight: 128.9 kg 128.6 kg 126 kg    Examination:  General exam: Pleasant alert coherent no distress Mallampati 4 Respiratory system: Clear to exam and auscultation no rales no rhonchi Cardiovascular system: S1-S2 sinus Gastrointestinal system: Obese nontender no rebound. Central nervous system: Grossly intact moving all 4 extremities Extremities: Edema noted predominantly on the left side with some knee swelling-medial joint line tenderness, does have some tenderness around the talonavicular area and at the lateral medial malleoli but this is nonspecific Skin: Intact Psychiatry: Euthymic and pleasant    Data Reviewed: I have personally reviewed following labs and imaging studies BUN/creatinine currently 56/1.5 (on admission 63/2.09 up from baseline of 20/1.2-peaked at 107/3.16   Radiology Studies: No results found.    Scheduled Meds: . Chlorhexidine Gluconate Cloth  6 each Topical Daily  . colchicine  0.6 mg Oral Daily  . docusate sodium  100 mg Oral BID  . escitalopram  20 mg Oral Daily  . gemfibrozil  600 mg Oral BID AC  . guaiFENesin  1,200 mg Oral BID  . heparin injection (subcutaneous)  5,000 Units Subcutaneous Q8H  . loratadine  10 mg Oral Daily  . mouth rinse  15 mL Mouth Rinse BID  . polyethylene glycol  17 g Oral Daily  . predniSONE  2.5 mg Oral Q breakfast  . torsemide  100 mg Oral Daily  . traZODone  50 mg Oral QHS   Continuous Infusions:   LOS: 10 days    Time spent: [place], MD [facility]   To contact the attending provider between 7A-7P or the covering provider during after hours 7P-7A, please log into the web site [email or web address] and access using universal [place] password for that web site. If you  do not have the password, please call the hospital operator.  11/18/2019, 10:28 AM

## 2019-11-18 NOTE — TOC Transition Note (Signed)
Transition of Care [facility]) - CM/SW Discharge Note   Patient Details  Name: Darren Hamilton MRN: [id number] Date of Birth: 20-Feb-1961  [facility]) CM/SW Contact:  Ida Rogue, LCSW Phone Number: 11/18/2019, 11:24 AM   Clinical Narrative:   Saw patient in follow up to PT recommendation of HH PT.  He is open to referral.  Cindie with [facility] made offer of services. No further needs identified. TOC will continue to follow during the course of hospitalization.     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

## 2019-11-18 NOTE — Progress Notes (Signed)
Physical Therapy Treatment Patient Details Name: Darren Hamilton MRN: [id number] DOB: [birth year] Today's Date: 11/18/2019    History of Present Illness 59 y.o. male with a history of hypertension, hyperlipidemia, asthma, anxiety, & nephrotic syndrome who comes to the ED 6/8  with complaints of worsening pain/swelling to the left lower extremity, unable to bear weight..Patient was seen in the emergency department 11/05/2019 for evaluation of similar symptoms, had left knee x-ray that was negative and LLE venous duplex that was negative for DVT, however did reveal ruptured baker's cyst. He was discharged home with norco .    PT Comments    Patient making steady progress with acute PT. He was able to advance gait training and distance today. After completing short distance in room with min guard and RW pt was agreeable to ambulate in hallway. He fatigues easily and requires intermittent rest breaks. No overt LOB noted during gait. Patient will continue to benefit from skilled PT interventions to address impairments and progress independence with functional mobility. Acute PT will progress as able.    Follow Up Recommendations  Home health PT     Equipment Recommendations  3in1 (PT)    Recommendations for Other Services       Precautions / Restrictions Precautions Precautions: Fall Precaution Comments: increased pain left foot Restrictions Weight Bearing Restrictions: No    Mobility  Bed Mobility Overal bed mobility: Modified Independent Bed Mobility: Supine to Sit     Supine to sit: Modified independent (Device/Increase time)     General bed mobility comments: HOB elevated, pt using rails, no assist required. pt requried extra time.  Transfers Overall transfer level: Needs assistance Equipment used: Rolling walker (2 wheeled) Transfers: Sit to/from [facility] Sit to Stand: Min guard Stand pivot transfers: Min guard       General transfer comment:  cues for hand placement. guarding for safety, pt reports some dizziness after rising.  Ambulation/Gait Ambulation/Gait assistance: Min guard;Supervision Gait Distance (Feet): 75 Feet (walk to bathroom and back to bed, then short ambulation ~60') Assistive device: Rolling walker (2 wheeled) Gait Pattern/deviations: Step-to pattern;Decreased stance time - left Gait velocity: decreased   General Gait Details: pt amb in room to go to the bathroom. no overt LOB. pt requried rest break after using bathroom and sat EOB ~ 3 minutes prior to additional amb in hallway. 2 stand rest breaks required in hallway. HR reached max of 124 bpm and SpO2 remained above 94%.   Stairs        Wheelchair Mobility    Modified Rankin (Stroke Patients Only)       Balance Overall balance assessment: Needs assistance Sitting-balance support: No upper extremity supported;Feet supported Sitting balance-Leahy Scale: Good Sitting balance - Comments: seated EOB and able to don socks   Standing balance support: Bilateral upper extremity supported;During functional activity;No upper extremity supported Standing balance-Leahy Scale: Fair Standing balance comment: pt able to stand for toileting and hand hygeine at sink.         Cognition Arousal/Alertness: Awake/alert Behavior During Therapy: WFL for tasks assessed/performed Overall Cognitive Status: Within Functional Limits for tasks assessed      General Comments: pt states he feels tired, maybe wore himself out yesterday      Exercises      General Comments        Pertinent Vitals/Pain Pain Assessment: 0-10 Pain Score: 7  (7 on Lt foot, 6 on Rt foot) Pain Location: left and right Pain Descriptors / Indicators: Aching;Sore Pain  Intervention(s): Limited activity within patient's tolerance;Monitored during session;Repositioned           PT Goals (current goals can now be found in the care plan section) Acute Rehab PT Goals Patient Stated  Goal: walk PT Goal Formulation: With patient Time For Goal Achievement: 11/22/19 (extended) Potential to Achieve Goals: Good Progress towards PT goals: Progressing toward goals    Frequency    Min 3X/week      PT Plan Current plan remains appropriate    Co-evaluation              AM-PAC PT "6 Clicks" Mobility   Outcome Measure  Help needed turning from your back to your side while in a flat bed without using bedrails?: None Help needed moving from lying on your back to sitting on the side of a flat bed without using bedrails?: None Help needed moving to and from a bed to a chair (including a wheelchair)?: A Little Help needed standing up from a chair using your arms (e.g., wheelchair or bedside chair)?: A Little Help needed to walk in hospital room?: A Little Help needed climbing 3-5 steps with a railing? : A Lot 6 Click Score: 19    End of Session Equipment Utilized During Treatment: Gait belt Activity Tolerance: Patient tolerated treatment well Patient left: in chair;with call bell/phone within reach Nurse Communication: Mobility status PT Visit Diagnosis: Other abnormalities of gait and mobility (R26.89);Pain Pain - Right/Left: Left Pain - part of body: Ankle and joints of foot     Time: 1133-1200 PT Time Calculation (min) (ACUTE ONLY): 27 min  Charges:  $Gait Training: 8-22 mins $Therapeutic Activity: 8-22 mins                    Wynn Maudlin, DPT Acute Rehabilitation Services  Office [phone] Pager [phone]  11/18/2019 12:37 PM

## 2019-11-19 LAB — RENAL FUNCTION PANEL
Albumin: 2.1 g/dL — ABNORMAL LOW (ref 3.5–5.0)
Anion gap: 10 (ref 5–15)
BUN: 49 mg/dL — ABNORMAL HIGH (ref 6–20)
CO2: 29 mmol/L (ref 22–32)
Calcium: 9 mg/dL (ref 8.9–10.3)
Chloride: 100 mmol/L (ref 98–111)
Creatinine, Ser: 1.75 mg/dL — ABNORMAL HIGH (ref 0.61–1.24)
GFR calc Af Amer: 49 mL/min — ABNORMAL LOW (ref >60)
GFR calc non Af Amer: 43 mL/min — ABNORMAL LOW (ref >60)
Glucose, Bld: 105 mg/dL — ABNORMAL HIGH (ref 70–99)
Phosphorus: 5.6 mg/dL — ABNORMAL HIGH (ref 2.5–4.6)
Potassium: 3.9 mmol/L (ref 3.5–5.1)
Sodium: 139 mmol/L (ref 135–145)

## 2019-11-19 MED ORDER — OXYCODONE-ACETAMINOPHEN 5-325 MG PO TABS: 2.0000 | ORAL_TABLET | ORAL | 0 refills | Status: DC | PRN

## 2019-11-19 MED ORDER — COLCHICINE 0.6 MG PO TABS: 0.6000 mg | ORAL_TABLET | Freq: Every day | ORAL | 0 refills | Status: DC

## 2019-11-19 MED ORDER — POLYETHYLENE GLYCOL 3350 17 G PO PACK: 17.0000 g | PACK | Freq: Every day | ORAL | 0 refills | Status: DC

## 2019-11-19 MED ORDER — PREDNISONE 2.5 MG PO TABS: 2.5000 mg | ORAL_TABLET | Freq: Every day | ORAL | 0 refills | Status: DC

## 2019-11-19 NOTE — Discharge Summary (Addendum)
Physician Discharge Summary  Darren Hamilton MRN:[id number] DOB: 03/22/1961 DOA: 11/07/2019  PCP: Hilts, Michael, MD  Admit date: 11/07/2019 Discharge date: 11/19/2019  Time spent: 40 minutes  Recommendations for Outpatient Follow-up:  1. Needs chem 12, Uric acid ~ 1 week 2. OP to see Dr. Patel Nephro re: fruther discussions Nephrotic synd 3. hh ordered on d/c 4. Consider 2 vw CXr ion 2 weeks to ensure clearing of lungs from fluid overload 5. 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 Patel-he was started on IV Lasix and IV albumin  Prograf was held   Hospital Course:  1. Hypoxic respiratory failure secondary to acute diastolic heart failure with volume overload contribution from nephrotic syndrome a. Improving and now not using any supplementary oxygen b. ? x-ray 2 weeks from discharge to ensure clearing of infiltrates on admission 2. Toxic metabolic encephalopathy encephalopathy a. Completely resolved at this  time 3. AKI superimposed on nephrotic syndrome a. Creatinine sig improved on d/c Demadex 100 daily, prednisone 2.5 daily as directed by nephrology b. Net fluid balance is -13.8 weight is down from admission 132 kg to 126 c. He will need outpatient follow-up and discussion about further options with Dr. Patel 4. Left lower extremity cellulitis/ruptured Baker's cyst + probable gout versus other polyarthralgia? a. Patient may have overexerted himself 6/13-limited Rx Ercocet on d/c b. Continue colchicine 0.6-he tells me that he did not have any of his joints tapped so diagnosis remains empiric--could be form immunomodulators c.  consider rheumatological referral/work-up on follow-up with his nephrologist if still bad 5. Intractable pain a. See above  6. BMI >44 7. HTN a. Continue labetalol 10 every 2 as needed-on admission held lisinopril 10 daily-but was resumed on d/c 8. Hyperlipidemia a. Continue gemfibrozil 600 daily  Procedures: multiple Consultations:  nephrology  Discharge Exam: Vitals:   11/18/19 1946 11/19/19 0425  BP: (!) 136/97 (!) 137/91  Pulse: (!) 101 86  Resp: 20 20  Temp: 98.2 F (36.8 C) 97.9 F (36.6 C)  SpO2: 92% 94%    General: awake alert pleasant in no distress, some pain in LLE but using walker Cardiovascular: s1 s 2no m/r/g Respiratory: clear no added sound abd soft nt nd no rebound no gaurd  Discharge Instructions   Discharge Instructions    Diet - low sodium heart healthy   Complete by: As directed    Discharge instructions   Complete by: As directed    Note dosage change of your prednisone in addition to discontinuation of your tacrolimus You will resume your torsemide at prior doses but we have held off on   Zaroxolyn You will resume your lisinopril at prior to admission doses  This admission you have been started on renally dosed colchicine which I will give you a limited prescription for you may need lab work such as a uric acid in the  outpatient setting as well as discussion with your primary physician regarding being placed on suppressive therapy Bear in mind that some people are over producers of uric acid and some people are under excreter's-the best way to diminish your chance of gout is to cut back on shellfish, high fructose corn syrup containing food items and soft drinks if you do drink  Happy summer and best of luck   Increase activity slowly   Complete by: As directed    Increase activity slowly   Complete by: As directed      Allergies as of 11/19/2019   No Known Allergies     Medication List    STOP taking these medications   HYDROcodone-acetaminophen 5-325 MG tablet Commonly known as: NORCO/VICODIN   methocarbamol 750 MG tablet Commonly known as: ROBAXIN   metolazone 5 MG tablet Commonly known as: ZAROXOLYN   multivitamin tablet   potassium chloride SA 20 MEQ tablet Commonly known as: KLOR-CON   tacrolimus 5 MG capsule Commonly known as: PROGRAF     TAKE these medications   acetaminophen 500 MG tablet Commonly known as: TYLENOL Take 500-1,000 mg by mouth every 6 (six) hours as needed for mild pain or moderate pain.   albuterol 108 (90 Base) MCG/ACT inhaler Commonly known as: VENTOLIN HFA Inhale 2 puffs into the lungs every 6 (six) hours as needed for wheezing or shortness of breath.   cetirizine 10 MG tablet Commonly known as: ZYRTEC Take 10 mg by mouth daily.   colchicine 0.6 MG tablet Take 1 tablet (0.6 mg total) by mouth daily.   DIALYVITE VITAMIN D 5000 PO Take 5,000 Units by mouth daily.   escitalopram 20 MG tablet Commonly known as: Lexapro Start 1/2 tab PO qd, may increase to 1 PO qd after 1 week What changed:   how much to take  how to take this  when to take this  additional instructions   gemfibrozil 600 MG tablet Commonly known as: LOPID TAKE 1 TABLET (600 MG TOTAL) BY MOUTH 2 (TWO) TIMES DAILY BEFORE A MEAL. What changed: when to take this   lisinopril  10 MG tablet Commonly known as: ZESTRIL TAKE 1 TABLET BY MOUTH EVERY DAY   oxyCODONE-acetaminophen 5-325 MG tablet Commonly known as: PERCOCET/ROXICET Take 2 tablets by mouth every 3 (three) hours as needed for severe pain.   polyethylene glycol 17 g packet Commonly known as: MIRALAX / GLYCOLAX Take 17 g by mouth daily.   predniSONE 2.5 MG tablet Commonly known as: DELTASONE Take 1 tablet (2.5 mg total) by mouth daily with breakfast. Start taking on: November 20, 2019 What changed:   medication strength  how much to take  when to take this  additional instructions   torsemide 100 MG tablet Commonly known as: DEMADEX Take 100 mg by mouth daily.      No Known Allergies  Follow-up Information    [facility] Follow up.   Specialty: Home Health Services Why: This is the [place] agency that will be working with you for PT.  They will schedule a time with you within 48 hours of d/c. Contact information: [place] [address] [phone]  The results of significant diagnostics from this hospitalization (including imaging, microbiology, ancillary and laboratory) are listed below for reference.    Significant Diagnostic Studies: DG Chest 1 View  Result Date: 11/15/2019 CLINICAL DATA:  Shortness of breath. EXAM: CHEST  1 VIEW COMPARISON:  11/12/2019 FINDINGS: Bilateral lung opacities on exam 3 days ago have near completely resolved. Slightly improved lung volumes from prior. Cardiomegaly. No new or progressive airspace disease. No pneumothorax or significant pleural effusion. IMPRESSION: 1. Near complete resolution of bilateral lung opacities since exam 3 days ago, favoring resolved pulmonary edema. Improved lung aeration from prior. No new abnormality. 2. Cardiomegaly. Electronically Signed   By: Melanie  Sanford M.D.   On: 11/15/2019 15:35   DG Chest 2 View  Result Date: 11/10/2019 CLINICAL DATA:  Bilateral lower  extremity edema and pain EXAM: CHEST - 2 VIEW COMPARISON:  02/24/2019 FINDINGS: Frontal and lateral views of the chest demonstrate an enlarged cardiac silhouette. There is central vascular congestion with interstitial prominence and patchy bibasilar consolidation. Small bilateral pleural effusions no pneumothorax. IMPRESSION: 1. Congestive heart failure. Electronically Signed   By: Michael  Brown M.D.   On: 11/10/2019 15:37   DG Tibia/Fibula Left  Result Date: 11/07/2019 CLINICAL DATA:  Leg swelling EXAM: LEFT TIBIA AND FIBULA - 2 VIEW COMPARISON:  None. FINDINGS: There is no evidence of fracture or other focal bone lesions. Diffuse soft tissue edema about the included left lower extremity. IMPRESSION: No fracture or dislocation of the left tibia or fibula. Diffuse soft tissue edema. Electronically Signed   By: Alex  Bibbey M.D.   On: 11/07/2019 09:32   CT TIBIA FIBULA LEFT WO CONTRAST  Result Date: 11/07/2019 CLINICAL DATA:  Left lower leg and foot pain and swelling since 11/05/2019. No known injury. EXAM: CT OF THE LOWER LEFT EXTREMITY WITHOUT CONTRAST TECHNIQUE: Multidetector CT imaging of the lower left extremity was performed according to the standard protocol. COMPARISON:  Plain films the left tibia and fibula today. FINDINGS: Bones/Joint/Cartilage No bony destructive change or periosteal reaction. No focal bony lesion. No fracture or dislocation. Imaged joints appear normal. Ligaments Suboptimally assessed by CT. Muscles and Tendons No intramuscular fluid collection. No gas within muscle or tracking along fascial planes. There is some fatty atrophy of lower leg musculature which is most notable in the medial gastrocnemius and likely related to disuse. Soft tissues Diffuse subcutaneous edema is present. No focal fluid collection, soft tissue gas or foreign body. IMPRESSION: Diffuse lower leg subcutaneous edema compatible with dependent change or cellulitis. The exam is otherwise negative. Electronically  Signed   By: Thomas  Dalessio M.D.   On: 11/07/2019 12:22   CT FOOT LEFT WO CONTRAST  Result Date: 11/07/2019 CLINICAL DATA:  Left lower leg and foot pain and swelling since 11/05/2019. No known injury. EXAM: CT OF THE LEFT FOOT WITHOUT CONTRAST TECHNIQUE: Multidetector CT imaging of the left foot was performed according to the standard protocol. Multiplanar CT image reconstructions were also generated. COMPARISON:  None. FINDINGS: Bones/Joint/Cartilage No bony destructive change or periosteal reaction. No focal bony lesion. No fracture or dislocation. Imaged joints appear normal. Ligaments Suboptimally assessed by CT. Muscles and Tendons Appear normal. Soft tissues Subcutaneous edema is present about the dorsum of the foot. No focal fluid collection, radiopaque foreign body or soft tissue gas. IMPRESSION: Subcutaneous edema consistent with dependent change or cellulitis. The exam is otherwise negative. Electronically Signed   By: Thomas  Dalessio M.D.   On: 11/07/2019 12:24   DG CHEST PORT 1 VIEW    Result Date: 11/12/2019 CLINICAL DATA:  Rapid response.  SOB. EXAM: PORTABLE CHEST 1 VIEW COMPARISON:  None. FINDINGS: Cardiac enlargement. Decreased lung volumes. New bilateral pulmonary opacities are identified particularly within the right upper lobe. Imaging findings may reflect areas of asymmetric alveolar edema or infection. IMPRESSION: New bilateral pulmonary opacities compatible with asymmetric alveolar edema versus multifocal infection. Electronically Signed   By: Taylor  Stroud M.D.   On: 11/12/2019 11:03   DG Knee Complete 4 Views Left  Result Date: 11/05/2019 CLINICAL DATA:  Midthigh and calf pain swelling. : LEFT KNEE - COMPLETE 4+ VIEW COMPARISON:  No prior. FINDINGS: No acute soft tissue or bony abnormality. No evidence of fracture dislocation. IMPRESSION: No acute abnormality. Electronically Signed   By: Thomas  Register   On: 11/05/2019 09:44   VAS US LOWER EXTREMITY VENOUS (DVT)  Result  Date: 11/08/2019  Lower Venous DVTStudy Indications: Pain.  Comparison Study: 11/07/19 Performing Technologist: Megan Riddle RVS  Examination Guidelines: A complete evaluation includes B-mode imaging, spectral Doppler, color Doppler, and power Doppler as needed of all accessible portions of each vessel. Bilateral testing is considered an integral part of a complete examination. Limited examinations for reoccurring indications may be performed as noted. The reflux portion of the exam is performed with the patient in reverse Trendelenburg.  +---------+---------------+---------+-----------+----------+--------------+ RIGHT    CompressibilityPhasicitySpontaneityPropertiesThrombus Aging +---------+---------------+---------+-----------+----------+--------------+ CFV      Full           Yes      Yes                                 +---------+---------------+---------+-----------+----------+--------------+ SFJ      Full                                                        +---------+---------------+---------+-----------+----------+--------------+ FV Prox  Full                                                        +---------+---------------+---------+-----------+----------+--------------+ FV Mid   Full                                                        +---------+---------------+---------+-----------+----------+--------------+ FV DistalFull                                                        +---------+---------------+---------+-----------+----------+--------------+ PFV      Full                                                        +---------+---------------+---------+-----------+----------+--------------+ POP        Full           Yes      Yes                                 +---------+---------------+---------+-----------+----------+--------------+ PTV      Full                                                         +---------+---------------+---------+-----------+----------+--------------+ PERO     Full                                                        +---------+---------------+---------+-----------+----------+--------------+   +----+---------------+---------+-----------+----------+--------------+ LEFTCompressibilityPhasicitySpontaneityPropertiesThrombus Aging +----+---------------+---------+-----------+----------+--------------+ CFV Full           Yes      Yes                                 +----+---------------+---------+-----------+----------+--------------+     Summary: RIGHT: - There is no evidence of deep vein thrombosis in the lower extremity.  - No cystic structure found in the popliteal fossa.  LEFT: - No evidence of common femoral vein obstruction.  *See table(s) above for measurements and observations. Electronically signed by Ruta Hinds MD on 11/08/2019 at 7:41:12 PM.    Final    VAS [place] LOWER EXTREMITY VENOUS (DVT) ([facility] and [facility] 7a-7p)  Result Date: 11/07/2019  Lower Venous DVTStudy Other Indications: Diagnosed with ruptured baker's cyst 2 days ago, now with                    worsening pain/ swelling/ cannot put weight on leg. Limitations: Body habitus. Comparison Study: 11/05/19 Performing Technologist: June Leap RDMS, RVT  Examination Guidelines: A complete evaluation includes B-mode imaging, spectral Doppler, color Doppler, and power Doppler as needed of all accessible portions of each vessel. Bilateral testing is considered an integral part of a complete examination. Limited examinations for reoccurring indications may be performed as noted. The reflux portion of the exam is performed with the patient in reverse Trendelenburg.  +---------+---------------+---------+-----------+----------+--------------+ LEFT     CompressibilityPhasicitySpontaneityPropertiesThrombus Aging +---------+---------------+---------+-----------+----------+--------------+ CFV      Full            Yes      Yes                                 +---------+---------------+---------+-----------+----------+--------------+ SFJ      Full                                                        +---------+---------------+---------+-----------+----------+--------------+ FV Prox  Full                                                        +---------+---------------+---------+-----------+----------+--------------+  FV Mid   Full                                                        +---------+---------------+---------+-----------+----------+--------------+ FV DistalFull                                                        +---------+---------------+---------+-----------+----------+--------------+ PFV      Full                                                        +---------+---------------+---------+-----------+----------+--------------+ POP      Full           Yes      Yes                                 +---------+---------------+---------+-----------+----------+--------------+ PTV      Full                                                        +---------+---------------+---------+-----------+----------+--------------+ PERO     Full                                                        +---------+---------------+---------+-----------+----------+--------------+     Summary: LEFT: - There is no evidence of deep vein thrombosis in the lower extremity.  Ruptured baker's cyst. Appears mostly unchanged from previous study.  *See table(s) above for measurements and observations. Electronically signed by Monica Martinez MD on 11/07/2019 at 4:09:11 PM.    Final    VAS [place] LOWER EXTREMITY VENOUS (DVT) (ONLY MC & [facility] 7a-7p)  Result Date: 11/05/2019  Lower Venous DVTStudy Indications: Swelling, and Pain.  Comparison Study: 02/24/19 Performing Technologist: June Leap RDMS, RVT  Examination Guidelines: A complete evaluation includes B-mode imaging, spectral Doppler,  color Doppler, and power Doppler as needed of all accessible portions of each vessel. Bilateral testing is considered an integral part of a complete examination. Limited examinations for reoccurring indications may be performed as noted. The reflux portion of the exam is performed with the patient in reverse Trendelenburg.  +-----+---------------+---------+-----------+----------+--------------+ RIGHTCompressibilityPhasicitySpontaneityPropertiesThrombus Aging +-----+---------------+---------+-----------+----------+--------------+ CFV                 Yes      Yes                                 +-----+---------------+---------+-----------+----------+--------------+   +---------+---------------+---------+-----------+----------+--------------+ LEFT     CompressibilityPhasicitySpontaneityPropertiesThrombus Aging +---------+---------------+---------+-----------+----------+--------------+ CFV      Full  Yes      Yes                                 +---------+---------------+---------+-----------+----------+--------------+ SFJ      Full                                                        +---------+---------------+---------+-----------+----------+--------------+ FV Prox  Full                                                        +---------+---------------+---------+-----------+----------+--------------+ FV Mid   Full                                                        +---------+---------------+---------+-----------+----------+--------------+ FV DistalFull                                                        +---------+---------------+---------+-----------+----------+--------------+ PFV      Full                                                        +---------+---------------+---------+-----------+----------+--------------+ POP      Full           Yes      Yes                                  +---------+---------------+---------+-----------+----------+--------------+ PTV      Full                                                        +---------+---------------+---------+-----------+----------+--------------+ PERO     Full                                                        +---------+---------------+---------+-----------+----------+--------------+     Summary: RIGHT: - No evidence of common femoral vein obstruction.  LEFT: - There is no evidence of deep vein thrombosis in the lower extremity.  - Fluid collection extending from popliteal fossa to proximal calf, appears to be ruptured baker's cyst.  *See table(s) above for measurements and observations. Electronically signed by Christopher Dickson MD on 11/05/2019 at 6:09:23 PM.    Final       Microbiology: Recent Results (from the past 240 hour(s))  MRSA PCR Screening     Status: None   Collection Time: 11/12/19 11:25 AM   Specimen: Nasal Mucosa; Nasopharyngeal  Result Value Ref Range Status   MRSA by PCR NEGATIVE NEGATIVE Final    Comment:        The GeneXpert MRSA Assay (FDA approved for NASAL specimens only), is one component of a comprehensive MRSA colonization surveillance program. It is not intended to diagnose MRSA infection nor to guide or monitor treatment for MRSA infections. Performed at [facility], [place] [address], [place], [place] [address]      Labs: Basic Metabolic Panel: Recent Labs  Lab 11/13/19 [phone] 11/13/19 8527 11/14/19 0522 11/15/19 0302 11/16/19 0528 11/17/19 0625 11/19/19 0904  NA 138   < > 139 138 140 140 139  K 4.3   < > 4.2 4.2 3.9 4.0 3.9  CL 100   < > 98 97* 98 98 100  CO2 26   < > _0 GLUCOSE 105*   < > 112* 117* 98 97 105*  BUN 97*   < > 97* 79* 67* 56* 49*  CREATININE 2.44*   < > 2.00*  1.98* 1.77* 1.75* 1.58* 1.75*  CALCIUM 8.7*   < > 9.0 8.9 9.1 8.9 9.0  PHOS 4.9*  --  4.8*  --   --   --  5.6*   < > = values in this interval not  displayed.   Liver Function Tests: Recent Labs  Lab 11/13/19 0648 11/14/19 0522 11/19/19 0904  ALBUMIN 2.7* 2.6* 2.1*   No results for input(s): LIPASE, AMYLASE in the last 168 hours. No results for input(s): AMMONIA in the last 168 hours. CBC: Recent Labs  Lab 11/14/19 0522 11/16/19 0528  WBC 8.8 11.4*  NEUTROABS 5.4 7.9*  HGB 11.6* 11.0*  HCT 35.2* 33.5*  MCV 92.6 93.3  PLT 450* 412*   Cardiac Enzymes: No results for input(s): CKTOTAL, CKMB, CKMBINDEX, TROPONINI in the last 168 hours. BNP: BNP (last 3 results) Recent Labs    02/24/19 1158 04/01/19 1246  BNP 1,161.0* 9.0    ProBNP (last 3 results) No results for input(s): PROBNP in the last 8760 hours.  CBG: No results for input(s): GLUCAP in the last 168 hours.     Signed:  Nita Sells MD   [facility] 11/19/2019, 11:00 AM

## 2019-11-19 NOTE — Progress Notes (Signed)
Physical Therapy Treatment Patient Details Name: Darren Hamilton MRN: [id number] DOB: [birth year] Today's Date: 11/19/2019    History of Present Illness 59 y.o. male with a history of hypertension, hyperlipidemia, asthma, anxiety, & nephrotic syndrome who comes to the ED 6/8  with complaints of worsening pain/swelling to the left lower extremity, unable to bear weight..Patient was seen in the emergency department 11/05/2019 for evaluation of similar symptoms, had left knee x-ray that was negative and LLE venous duplex that was negative for DVT, however did reveal ruptured baker's cyst. He was discharged home with norco .    PT Comments    Patient progressing well with Acute PT and increased gait distance to ~400' today with RW. Pt maintained safe proximity to RW demonstrating good carryover from prior sessions. He was instructed on ankle pumps to manage LE edema. Patient is planning to continue with HHPT once discharged and is highly motivated to get back to normal independence. Acute PT will continue to progress as able.   Follow Up Recommendations  Home health PT     Equipment Recommendations  3in1 (PT)    Recommendations for Other Services       Precautions / Restrictions Precautions Precautions: Fall Precaution Comments: increased pain left foot Restrictions Weight Bearing Restrictions: No    Mobility  Bed Mobility Overal bed mobility: Modified Independent Bed Mobility: Supine to Sit     Supine to sit: Modified independent (Device/Increase time)     General bed mobility comments: HOB elevated, pt using rails, no assist required. pt requried extra time.  Transfers Overall transfer level: Needs assistance Equipment used: Rolling walker (2 wheeled) Transfers: Sit to/from [employer] Sit to Stand: Min guard Stand pivot transfers: Min guard       General transfer comment: cues for hand placement. guarding for  safety.  Ambulation/Gait Ambulation/Gait assistance: Min guard;Supervision Gait Distance (Feet): 400 Feet Assistive device: Rolling walker (2 wheeled) Gait Pattern/deviations: Step-to pattern;Decreased stance time - left Gait velocity: fair   General Gait Details: pt with improved tolerance today to progress ambulation. no overt LOB during gait. pt maintained safe proximity to RW during gait. 5x standing rest break due to fatigue/SOB. pt SpO2 remained 94% or greater and HR reached max of 128 bpm during gait remained between 113-124 bpm majority of gait.    Stairs          Wheelchair Mobility    Modified Rankin (Stroke Patients Only)       Balance Overall balance assessment: Needs assistance Sitting-balance support: No upper extremity supported;Feet supported Sitting balance-Leahy Scale: Good Sitting balance - Comments: seated EOB and able to don socks   Standing balance support: Bilateral upper extremity supported;During functional activity;No upper extremity supported Standing balance-Leahy Scale: Fair           Cognition Arousal/Alertness: Awake/alert Behavior During Therapy: WFL for tasks assessed/performed Overall Cognitive Status: Within Functional Limits for tasks assessed             Exercises Other Exercises Other Exercises: 10x bil LE ankle pumps for edema management    General Comments        Pertinent Vitals/Pain Pain Assessment: 0-10 Pain Score: 7  Pain Location: Lt foot Pain Descriptors / Indicators: Aching;Sore Pain Intervention(s): Limited activity within patient's tolerance;Monitored during session;Repositioned           PT Goals (current goals can now be found in the care plan section) Acute Rehab PT Goals Patient Stated Goal: walk PT Goal Formulation: With patient  Time For Goal Achievement: 11/22/19 (extended) Potential to Achieve Goals: Good Progress towards PT goals: Progressing toward goals    Frequency    Min  3X/week      PT Plan Current plan remains appropriate    Co-evaluation              AM-PAC PT "6 Clicks" Mobility   Outcome Measure  Help needed turning from your back to your side while in a flat bed without using bedrails?: None Help needed moving from lying on your back to sitting on the side of a flat bed without using bedrails?: None Help needed moving to and from a bed to a chair (including a wheelchair)?: A Little Help needed standing up from a chair using your arms (e.g., wheelchair or bedside chair)?: A Little Help needed to walk in hospital room?: A Little Help needed climbing 3-5 steps with a railing? : A Lot 6 Click Score: 19    End of Session Equipment Utilized During Treatment: Gait belt Activity Tolerance: Patient tolerated treatment well Patient left: in chair;with call bell/phone within reach Nurse Communication: Mobility status PT Visit Diagnosis: Other abnormalities of gait and mobility (R26.89);Pain Pain - Right/Left: Left Pain - part of body: Ankle and joints of foot     Time: 5681-2751 PT Time Calculation (min) (ACUTE ONLY): 19 min  Charges:  $Gait Training: 8-22 mins                     Verner Mould, DPT Acute Rehabilitation Services  Office [phone] Pager [phone]  11/19/2019 12:11 PM

## 2019-11-21 ENCOUNTER — Telehealth: Payer: Self-pay | Admitting: Family Medicine

## 2019-11-21 DIAGNOSIS — I5031 Acute diastolic (congestive) heart failure: Secondary | ICD-10-CM | POA: Diagnosis not present

## 2019-11-21 DIAGNOSIS — G92 Toxic encephalopathy: Secondary | ICD-10-CM | POA: Diagnosis not present

## 2019-11-21 DIAGNOSIS — L03116 Cellulitis of left lower limb: Secondary | ICD-10-CM | POA: Diagnosis not present

## 2019-11-21 DIAGNOSIS — D509 Iron deficiency anemia, unspecified: Secondary | ICD-10-CM | POA: Diagnosis not present

## 2019-11-21 DIAGNOSIS — E785 Hyperlipidemia, unspecified: Secondary | ICD-10-CM | POA: Diagnosis not present

## 2019-11-21 DIAGNOSIS — M170 Bilateral primary osteoarthritis of knee: Secondary | ICD-10-CM | POA: Diagnosis not present

## 2019-11-21 DIAGNOSIS — J9691 Respiratory failure, unspecified with hypoxia: Secondary | ICD-10-CM | POA: Diagnosis not present

## 2019-11-21 DIAGNOSIS — N049 Nephrotic syndrome with unspecified morphologic changes: Secondary | ICD-10-CM | POA: Diagnosis not present

## 2019-11-21 DIAGNOSIS — Z7952 Long term (current) use of systemic steroids: Secondary | ICD-10-CM | POA: Diagnosis not present

## 2019-11-21 DIAGNOSIS — F419 Anxiety disorder, unspecified: Secondary | ICD-10-CM | POA: Diagnosis not present

## 2019-11-21 DIAGNOSIS — F329 Major depressive disorder, single episode, unspecified: Secondary | ICD-10-CM | POA: Diagnosis not present

## 2019-11-21 DIAGNOSIS — M19071 Primary osteoarthritis, right ankle and foot: Secondary | ICD-10-CM | POA: Diagnosis not present

## 2019-11-21 DIAGNOSIS — M109 Gout, unspecified: Secondary | ICD-10-CM | POA: Diagnosis not present

## 2019-11-21 DIAGNOSIS — J302 Other seasonal allergic rhinitis: Secondary | ICD-10-CM | POA: Diagnosis not present

## 2019-11-21 DIAGNOSIS — I110 Hypertensive heart disease with heart failure: Secondary | ICD-10-CM | POA: Diagnosis not present

## 2019-11-21 DIAGNOSIS — M19072 Primary osteoarthritis, left ankle and foot: Secondary | ICD-10-CM | POA: Diagnosis not present

## 2019-11-21 NOTE — Telephone Encounter (Signed)
Verbal order given  

## 2019-11-21 NOTE — Telephone Encounter (Signed)
Vhavin from [facility] called to requests VO for PT to continue.  CB#[phone].  Thank you.

## 2019-11-22 DIAGNOSIS — M170 Bilateral primary osteoarthritis of knee: Secondary | ICD-10-CM | POA: Diagnosis not present

## 2019-11-22 DIAGNOSIS — G92 Toxic encephalopathy: Secondary | ICD-10-CM | POA: Diagnosis not present

## 2019-11-22 DIAGNOSIS — E785 Hyperlipidemia, unspecified: Secondary | ICD-10-CM | POA: Diagnosis not present

## 2019-11-22 DIAGNOSIS — J302 Other seasonal allergic rhinitis: Secondary | ICD-10-CM | POA: Diagnosis not present

## 2019-11-22 DIAGNOSIS — D509 Iron deficiency anemia, unspecified: Secondary | ICD-10-CM | POA: Diagnosis not present

## 2019-11-22 DIAGNOSIS — L03116 Cellulitis of left lower limb: Secondary | ICD-10-CM | POA: Diagnosis not present

## 2019-11-22 DIAGNOSIS — N049 Nephrotic syndrome with unspecified morphologic changes: Secondary | ICD-10-CM | POA: Diagnosis not present

## 2019-11-22 DIAGNOSIS — M109 Gout, unspecified: Secondary | ICD-10-CM | POA: Diagnosis not present

## 2019-11-22 DIAGNOSIS — F419 Anxiety disorder, unspecified: Secondary | ICD-10-CM | POA: Diagnosis not present

## 2019-11-22 DIAGNOSIS — I5031 Acute diastolic (congestive) heart failure: Secondary | ICD-10-CM | POA: Diagnosis not present

## 2019-11-22 DIAGNOSIS — Z7952 Long term (current) use of systemic steroids: Secondary | ICD-10-CM | POA: Diagnosis not present

## 2019-11-22 DIAGNOSIS — I110 Hypertensive heart disease with heart failure: Secondary | ICD-10-CM | POA: Diagnosis not present

## 2019-11-22 DIAGNOSIS — M19072 Primary osteoarthritis, left ankle and foot: Secondary | ICD-10-CM | POA: Diagnosis not present

## 2019-11-22 DIAGNOSIS — M19071 Primary osteoarthritis, right ankle and foot: Secondary | ICD-10-CM | POA: Diagnosis not present

## 2019-11-22 DIAGNOSIS — F329 Major depressive disorder, single episode, unspecified: Secondary | ICD-10-CM | POA: Diagnosis not present

## 2019-11-22 DIAGNOSIS — J9691 Respiratory failure, unspecified with hypoxia: Secondary | ICD-10-CM | POA: Diagnosis not present

## 2019-11-27 DIAGNOSIS — F39 Unspecified mood [affective] disorder: Secondary | ICD-10-CM | POA: Diagnosis not present

## 2019-11-28 DIAGNOSIS — D509 Iron deficiency anemia, unspecified: Secondary | ICD-10-CM | POA: Diagnosis not present

## 2019-11-28 DIAGNOSIS — J302 Other seasonal allergic rhinitis: Secondary | ICD-10-CM | POA: Diagnosis not present

## 2019-11-28 DIAGNOSIS — M19071 Primary osteoarthritis, right ankle and foot: Secondary | ICD-10-CM | POA: Diagnosis not present

## 2019-11-28 DIAGNOSIS — J9691 Respiratory failure, unspecified with hypoxia: Secondary | ICD-10-CM | POA: Diagnosis not present

## 2019-11-28 DIAGNOSIS — E785 Hyperlipidemia, unspecified: Secondary | ICD-10-CM | POA: Diagnosis not present

## 2019-11-28 DIAGNOSIS — Z7952 Long term (current) use of systemic steroids: Secondary | ICD-10-CM | POA: Diagnosis not present

## 2019-11-28 DIAGNOSIS — N1831 Chronic kidney disease, stage 3a: Secondary | ICD-10-CM | POA: Diagnosis not present

## 2019-11-28 DIAGNOSIS — F329 Major depressive disorder, single episode, unspecified: Secondary | ICD-10-CM | POA: Diagnosis not present

## 2019-11-28 DIAGNOSIS — G92 Toxic encephalopathy: Secondary | ICD-10-CM | POA: Diagnosis not present

## 2019-11-28 DIAGNOSIS — I110 Hypertensive heart disease with heart failure: Secondary | ICD-10-CM | POA: Diagnosis not present

## 2019-11-28 DIAGNOSIS — L03116 Cellulitis of left lower limb: Secondary | ICD-10-CM | POA: Diagnosis not present

## 2019-11-28 DIAGNOSIS — I5031 Acute diastolic (congestive) heart failure: Secondary | ICD-10-CM | POA: Diagnosis not present

## 2019-11-28 DIAGNOSIS — N049 Nephrotic syndrome with unspecified morphologic changes: Secondary | ICD-10-CM | POA: Diagnosis not present

## 2019-11-28 DIAGNOSIS — M109 Gout, unspecified: Secondary | ICD-10-CM | POA: Diagnosis not present

## 2019-11-28 DIAGNOSIS — M19072 Primary osteoarthritis, left ankle and foot: Secondary | ICD-10-CM | POA: Diagnosis not present

## 2019-11-28 DIAGNOSIS — F419 Anxiety disorder, unspecified: Secondary | ICD-10-CM | POA: Diagnosis not present

## 2019-11-28 DIAGNOSIS — M170 Bilateral primary osteoarthritis of knee: Secondary | ICD-10-CM | POA: Diagnosis not present

## 2019-12-03 DIAGNOSIS — G4733 Obstructive sleep apnea (adult) (pediatric): Secondary | ICD-10-CM | POA: Diagnosis not present

## 2019-12-05 DIAGNOSIS — N1831 Chronic kidney disease, stage 3a: Secondary | ICD-10-CM | POA: Diagnosis not present

## 2019-12-10 DIAGNOSIS — F39 Unspecified mood [affective] disorder: Secondary | ICD-10-CM | POA: Diagnosis not present

## 2019-12-12 DIAGNOSIS — N1831 Chronic kidney disease, stage 3a: Secondary | ICD-10-CM | POA: Diagnosis not present

## 2019-12-16 ENCOUNTER — Other Ambulatory Visit: Payer: Self-pay | Admitting: Family Medicine

## 2019-12-16 DIAGNOSIS — N041 Nephrotic syndrome with focal and segmental glomerular lesions: Secondary | ICD-10-CM | POA: Diagnosis not present

## 2019-12-16 DIAGNOSIS — I129 Hypertensive chronic kidney disease with stage 1 through stage 4 chronic kidney disease, or unspecified chronic kidney disease: Secondary | ICD-10-CM | POA: Diagnosis not present

## 2019-12-16 DIAGNOSIS — N1831 Chronic kidney disease, stage 3a: Secondary | ICD-10-CM | POA: Diagnosis not present

## 2019-12-16 DIAGNOSIS — M109 Gout, unspecified: Secondary | ICD-10-CM | POA: Diagnosis not present

## 2019-12-24 DIAGNOSIS — F39 Unspecified mood [affective] disorder: Secondary | ICD-10-CM | POA: Diagnosis not present

## 2019-12-30 DIAGNOSIS — F840 Autistic disorder: Secondary | ICD-10-CM | POA: Diagnosis not present

## 2019-12-30 DIAGNOSIS — F6381 Intermittent explosive disorder: Secondary | ICD-10-CM | POA: Diagnosis not present

## 2019-12-30 DIAGNOSIS — F411 Generalized anxiety disorder: Secondary | ICD-10-CM | POA: Diagnosis not present

## 2020-01-01 DIAGNOSIS — N041 Nephrotic syndrome with focal and segmental glomerular lesions: Secondary | ICD-10-CM | POA: Diagnosis not present

## 2020-01-01 DIAGNOSIS — N1831 Chronic kidney disease, stage 3a: Secondary | ICD-10-CM | POA: Diagnosis not present

## 2020-01-07 DIAGNOSIS — F39 Unspecified mood [affective] disorder: Secondary | ICD-10-CM | POA: Diagnosis not present

## 2020-01-30 DIAGNOSIS — N189 Chronic kidney disease, unspecified: Secondary | ICD-10-CM | POA: Diagnosis not present

## 2020-01-30 DIAGNOSIS — I129 Hypertensive chronic kidney disease with stage 1 through stage 4 chronic kidney disease, or unspecified chronic kidney disease: Secondary | ICD-10-CM | POA: Diagnosis not present

## 2020-01-30 DIAGNOSIS — N1831 Chronic kidney disease, stage 3a: Secondary | ICD-10-CM | POA: Diagnosis not present

## 2020-01-30 DIAGNOSIS — D631 Anemia in chronic kidney disease: Secondary | ICD-10-CM | POA: Diagnosis not present

## 2020-01-30 DIAGNOSIS — N041 Nephrotic syndrome with focal and segmental glomerular lesions: Secondary | ICD-10-CM | POA: Diagnosis not present

## 2020-02-04 DIAGNOSIS — F39 Unspecified mood [affective] disorder: Secondary | ICD-10-CM | POA: Diagnosis not present

## 2020-02-18 DIAGNOSIS — F39 Unspecified mood [affective] disorder: Secondary | ICD-10-CM | POA: Diagnosis not present

## 2020-02-25 ENCOUNTER — Other Ambulatory Visit: Payer: Self-pay | Admitting: Family Medicine

## 2020-03-03 DIAGNOSIS — F39 Unspecified mood [affective] disorder: Secondary | ICD-10-CM | POA: Diagnosis not present

## 2020-03-03 DIAGNOSIS — N1831 Chronic kidney disease, stage 3a: Secondary | ICD-10-CM | POA: Diagnosis not present

## 2020-03-10 ENCOUNTER — Encounter: Payer: Self-pay | Admitting: Family Medicine

## 2020-03-10 ENCOUNTER — Ambulatory Visit (INDEPENDENT_AMBULATORY_CARE_PROVIDER_SITE_OTHER): Payer: BC Managed Care – PPO | Admitting: Family Medicine

## 2020-03-10 VITALS — BP 141/94 | HR 87 | Ht 68.0 in | Wt 276.0 lb

## 2020-03-10 DIAGNOSIS — G4733 Obstructive sleep apnea (adult) (pediatric): Secondary | ICD-10-CM

## 2020-03-10 DIAGNOSIS — Z9989 Dependence on other enabling machines and devices: Secondary | ICD-10-CM

## 2020-03-10 NOTE — Patient Instructions (Signed)
Please continue using your CPAP regularly. While your insurance requires that you use CPAP at least 4 hours each night on 70% of the nights, I recommend, that you not skip any nights and use it throughout the night if you can. Getting used to CPAP and staying with the treatment long term does take time and patience and discipline. Untreated obstructive sleep apnea when it is moderate to severe can have an adverse impact on cardiovascular health and raise her risk for heart disease, arrhythmias, hypertension, congestive heart failure, stroke and diabetes. Untreated obstructive sleep apnea causes sleep disruption, nonrestorative sleep, and sleep deprivation. This can have an impact on your day to day functioning and cause daytime sleepiness and impairment of cognitive function, memory loss, mood disturbance, and problems focussing. Using CPAP regularly can improve these symptoms.   We will increase your maximum pressure to 17cmH20. Continue working on correct mask seal at home. May consider retrying silicone mask. We will repeat download in 4-6 weeks to assess response following pressure changes.   Follow up in 1 year, sooner if needed    Sleep Apnea Sleep apnea affects breathing during sleep. It causes breathing to stop for a short time or to become shallow. It can also increase the risk of:  Heart attack.  Stroke.  Being very overweight (obese).  Diabetes.  Heart failure.  Irregular heartbeat. The goal of treatment is to help you breathe normally again. What are the causes? There are three kinds of sleep apnea:  Obstructive sleep apnea. This is caused by a blocked or collapsed airway.  Central sleep apnea. This happens when the brain does not send the right signals to the muscles that control breathing.  Mixed sleep apnea. This is a combination of obstructive and central sleep apnea. The most common cause of this condition is a collapsed or blocked airway. This can happen if:  Your  throat muscles are too relaxed.  Your tongue and tonsils are too large.  You are overweight.  Your airway is too small. What increases the risk?  Being overweight.  Smoking.  Having a small airway.  Being older.  Being male.  Drinking alcohol.  Taking medicines to calm yourself (sedatives or tranquilizers).  Having family members with the condition. What are the signs or symptoms?  Trouble staying asleep.  Being sleepy or tired during the day.  Getting angry a lot.  Loud snoring.  Headaches in the morning.  Not being able to focus your mind (concentrate).  Forgetting things.  Less interest in sex.  Mood swings.  Personality changes.  Feelings of sadness (depression).  Waking up a lot during the night to pee (urinate).  Dry mouth.  Sore throat. How is this diagnosed?  Your medical history.  A physical exam.  A test that is done when you are sleeping (sleep study). The test is most often done in a sleep lab but may also be done at home. How is this treated?   Sleeping on your side.  Using a medicine to get rid of mucus in your nose (decongestant).  Avoiding the use of alcohol, medicines to help you relax, or certain pain medicines (narcotics).  Losing weight, if needed.  Changing your diet.  Not smoking.  Using a machine to open your airway while you sleep, such as: ? An oral appliance. This is a mouthpiece that shifts your lower jaw forward. ? A CPAP device. This device blows air through a mask when you breathe out (exhale). ? An EPAP  device. This has valves that you put in each nostril. ? A BPAP device. This device blows air through a mask when you breathe in (inhale) and breathe out.  Having surgery if other treatments do not work. It is important to get treatment for sleep apnea. Without treatment, it can lead to:  High blood pressure.  Coronary artery disease.  In men, not being able to have an erection (impotence).  Reduced  thinking ability. Follow these instructions at home: Lifestyle  Make changes that your doctor recommends.  Eat a healthy diet.  Lose weight if needed.  Avoid alcohol, medicines to help you relax, and some pain medicines.  Do not use any products that contain nicotine or tobacco, such as cigarettes, e-cigarettes, and chewing tobacco. If you need help quitting, ask your doctor. General instructions  Take over-the-counter and prescription medicines only as told by your doctor.  If you were given a machine to use while you sleep, use it only as told by your doctor.  If you are having surgery, make sure to tell your doctor you have sleep apnea. You may need to bring your device with you.  Keep all follow-up visits as told by your doctor. This is important. Contact a doctor if:  The machine that you were given to use during sleep bothers you or does not seem to be working.  You do not get better.  You get worse. Get help right away if:  Your chest hurts.  You have trouble breathing in enough air.  You have an uncomfortable feeling in your back, arms, or stomach.  You have trouble talking.  One side of your body feels weak.  A part of your face is hanging down. These symptoms may be an emergency. Do not wait to see if the symptoms will go away. Get medical help right away. Call your local emergency services (911 in the [place] Do not drive yourself to the hospital. Summary  This condition affects breathing during sleep.  The most common cause is a collapsed or blocked airway.  The goal of treatment is to help you breathe normally while you sleep. This information is not intended to replace advice given to you by your health care provider. Make sure you discuss any questions you have with your health care provider. Document Revised: 03/09/2018 Document Reviewed: 01/16/2018 Elsevier Patient Education  2020 [employer].

## 2020-03-10 NOTE — Progress Notes (Addendum)
PATIENT: Darren Hamilton DOB: July 29, 1960  REASON FOR VISIT: follow up HISTORY FROM: patient  No chief complaint on file.    HISTORY OF PRESENT ILLNESS: Today 03/10/20 Darren Hamilton is a 59 y.o. male here today for follow up for OSA on CPAP. He reports that he has done very well with CPAP therapy over the past 6 months.  He notes significant improvement in sleep quality as well as daytime energy levels with consistent usage of CPAP.  He did try using a chinstrap but states that this did not work out well for him.  He does not feel that mouth breathing continues to be a concern.  He does have a significant amount of facial hair.  He feels that his foam FFM slips off the top of his nose.  He was previously using a silicone fullface mask and is curious if that may work better for him.  Otherwise, he is doing very well.  Compliance report dated 02/08/2020 through 03/08/2020 reveals that he used CPAP 30 of the past 30 days for compliance of 100%.  He used CPAP greater than 4 hours all 30 days.  Average usage was 7 hours and 34 minutes.  Residual AHI was elevated at 6.0 on 7 to 16 cm of water pressure and an EPR of 3.  A significant leak remains in the 95th percentile of 48.5 L/min.  HISTORY: (copied from Darren Hamilton note on 09/10/2019)  Darren. 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 The patient is unaccompanied today. I saw him on 06/11/2019, at which time he was fully compliant with his AutoPap, he felt like he was sleeping better.  He had an interim issue with his kidneys, was diagnosed with focal glomerulosclerosis.  He had some issues with the nasal mass causing almost like an acne-like outbreak around the nose.  He had lost weight.  I suggested we proceed with an overnight  pulse oximetry test to make sure his oxygen saturations were adequate while in was on his AutoPap therapy.  He had a overnight pulse oximetry test on 06/14/2019 which showed an average oxygen saturation of 94%, nadir was 84%, time below or at 88% saturation while on room air on AutoPap was 43 seconds.  We attempted to call him with his test results.  He was advised to continue with AutoPap therapy.  Today, 09/09/2019: I reviewed his AutoPap compliance data from 08/10/2019 through 09/08/2019, which is a total of 30 days, during which time he used his machine every night with percent use days greater than 4 hours at 100%, indicating superb compliance with an average usage of 6 hours and 55 minutes, residual AHI borderline at 4.9/h, average pressure for the 95th percentile at 15.8 cm, leak high with a 95th percentile at 53.1 L/min on a pressure range of 7 cm to 16 cm with EPR.  We also reviewed his overnight pulse oximetry test results from 06/14/2019.  He has been compliant with his CPAP, continues to benefit from it but reports significant weight fluctuations, particularly with fluid weight.  He is currently on prednisone.  He was treated with a diuretic recently, follows closely with nephrology.  He keeps his beard trimmed, the full facemask is still comfortable enough with the memory foam and he is mindful about changing his supplies on a timely basis but has  noticed air leaking from the mouth, he has a tendency to have his mouth open at night.  He wants to continue to work on weight loss but it is frustrating for him because of exercise intolerance, feeling fatigued, and fluid retention.  He is motivated to continue with his AutoPap.  The patient's allergies, current medications, family history, past medical history, past social history, past surgical history and problem list were reviewed and updated as appropriate.  Previously:    I first met him on 02/05/2019 at the request of his primary care physician, at  which time the patient reported snoring and excessive daytime somnolence as well as witnessed apneas. He was advised to proceed with sleep study testing. He had a home sleep test on 02/20/2019 which showed severe obstructive sleep apnea with a total AHI of 86.4/hour and O2 nadir of 67%. Since his insurance did not approve a laboratory attended sleep study, he was advised to proceed with AutoPap therapy at home.  I reviewed his AutoPap compliance data from 05/11/2019 through 06/09/2019 which is a total of 30 days, during which time he used his machine every night with percent use days greater than 4 hours at 100%, indicating superb compliance with an average usage of 7 hours and 42 minutes, residual AHI borderline at 4.6/h, 95th percentile of pressure at 14.8 cm with a range of 7 cm to 15 cm with EPR of 3, leak on the higher end with a 95th percentile at 21.8 L/min.    02/05/2019: (He) reports snoring, excessive daytime sleepiness and witnessed apneas per wife's report. I reviewed your office note from 12/25/2018. His Epworth sleepiness score is 19 out of 24, fatigue severity score is 54 out of 63. 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 His bedtime is generally around 11 but sometimes later than that. His rise time is generally around 7 AM. He lives with his wife and 75 year old son who has autism. He has a TV in the bedroom but they hardly use it. They have 2 cats in the household who do not bother them at night. He has recently noticed lower extremity edema. He has been referred for an echocardiogram. He had a stress test several years ago which was benign per his report. He has gained over the past several years. He has rare morning headaches, nocturia about once per average night, admits to drinking quite a bit of caffeine in the form of coffee, about a 8 cup pot per day and 1 serving of tea. He does not have any telltale symptoms of restless leg syndrome but is a restless sleeper.  His wife has commented on his loud snoring which is worse when he is on his back. He tries to sleep on his sides. He is not aware of any family history of sleep apnea but suspects that his mom may have had it, she was a loud snorer, she passed away recently at age 28, father passed away from complications of heart disease at age 40.   REVIEW OF SYSTEMS: Out of a complete 14 system review of symptoms, the patient complains only of the following symptoms, fatigue and all other reviewed systems are negative.  ESS: 2 FSS: 21  ALLERGIES: No Known Allergies  HOME MEDICATIONS: Outpatient Medications Prior to Visit  Medication Sig Dispense Refill  . acetaminophen (TYLENOL) 500 MG tablet Take 500-1,000 mg by mouth every 6 (six) hours as needed for mild pain or moderate pain.    Marland Kitchen albuterol (VENTOLIN HFA) 108 (  90 Base) MCG/ACT inhaler TAKE 2 PUFFS BY MOUTH EVERY 6 HOURS AS NEEDED FOR WHEEZE OR SHORTNESS OF BREATH 8.5 each 11  . allopurinol (ZYLOPRIM) 100 MG tablet Take 100 mg by mouth daily.    . cetirizine (ZYRTEC) 10 MG tablet Take 10 mg by mouth daily.    . Cholecalciferol (DIALYVITE VITAMIN D 5000 PO) Take 5,000 Units by mouth daily.     . colchicine 0.6 MG tablet Take 1 tablet (0.6 mg total) by mouth daily. 30 tablet 0  . escitalopram (LEXAPRO) 20 MG tablet Take 1 tablet (20 mg total) by mouth daily. START 1/2 TAB DAILY, MAY INCREASE TO 1 DAILY AFTER 1 WEEK 90 tablet 3  . gemfibrozil (LOPID) 600 MG tablet TAKE 1 TABLET (600 MG TOTAL) BY MOUTH 2 (TWO) TIMES DAILY BEFORE A MEAL. (Patient taking differently: Take 600 mg by mouth 2 (two) times daily with a meal. ) 180 tablet 3  . lisinopril (ZESTRIL) 10 MG tablet TAKE 1 TABLET BY MOUTH EVERY DAY (Patient taking differently: Take 10 mg by mouth daily. ) 90 tablet 3  . tacrolimus (PROGRAF) 1 MG capsule Take 1 mg by mouth 2 (two) times daily.    Marland Kitchen torsemide (DEMADEX) 100 MG tablet Take 100 mg by mouth daily.    Marland Kitchen oxyCODONE-acetaminophen  (PERCOCET/ROXICET) 5-325 MG tablet Take 2 tablets by mouth every 3 (three) hours as needed for severe pain. 21 tablet 0  . polyethylene glycol (MIRALAX / GLYCOLAX) 17 g packet Take 17 g by mouth daily. 14 each 0  . predniSONE (DELTASONE) 2.5 MG tablet Take 1 tablet (2.5 mg total) by mouth daily with breakfast. 30 tablet 0   No facility-administered medications prior to visit.    PAST MEDICAL HISTORY: Past Medical History:  Diagnosis Date  . Anxiety   . Asthma   . Depression   . Hypertension   . Nephrotic syndrome     PAST SURGICAL HISTORY: Past Surgical History:  Procedure Laterality Date  . RENAL BIOPSY      FAMILY HISTORY: Family History  Problem Relation Age of Onset  . Heart failure Mother   . Heart failure Father     SOCIAL HISTORY: Social History   Socioeconomic History  . Marital status: Single    Spouse name: Not on file  . Number of children: Not on file  . Years of education: Not on file  . Highest education level: Not on file  Occupational History  . Not on file  Tobacco Use  . Smoking status: Never Smoker  . Smokeless tobacco: Never Used  Vaping Use  . Vaping Use: Never used  Substance and Sexual Activity  . Alcohol use: Yes    Alcohol/week: 3.0 standard drinks    Types: 1 Glasses of wine, 1 Cans of beer, 1 Shots of liquor per week    Comment: occasionally  . Drug use: No  . Sexual activity: Not on file  Other Topics Concern  . Not on file  Social History Narrative  . Not on file   Social Determinants of Health   Financial Resource Strain:   . Difficulty of Paying Living Expenses: Not on file  Food Insecurity:   . Worried About Charity fundraiser in the Last Year: Not on file  . Ran Out of Food in the Last Year: Not on file  Transportation Needs:   . Lack of Transportation (Medical): Not on file  . Lack of Transportation (Non-Medical): Not on file  Physical Activity:   .  Days of Exercise per Week: Not on file  . Minutes of Exercise  per Session: Not on file  Stress:   . Feeling of Stress : Not on file  Social Connections:   . Frequency of Communication with Friends and Family: Not on file  . Frequency of Social Gatherings with Friends and Family: Not on file  . Attends Religious Services: Not on file  . Active Member of Clubs or Organizations: Not on file  . Attends Archivist Meetings: Not on file  . Marital Status: Not on file  Intimate Partner Violence:   . Fear of Current or Ex-Partner: Not on file  . Emotionally Abused: Not on file  . Physically Abused: Not on file  . Sexually Abused: Not on file      PHYSICAL EXAM  Vitals:   03/10/20 1524  BP: (!) 141/94  Pulse: 87  Weight: 276 lb (125.2 kg)  Height: _0  (1.727 m)   Body mass index is 41.97 kg/m.  Generalized: Well developed, in no acute distress  Cardiology: normal rate and rhythm, no murmur noted Respiratory: clear to auscultation bilaterally  Neurological examination  Mentation: Alert oriented to time, place, history taking. Follows all commands speech and language fluent Cranial nerve II-XII: Pupils were equal round reactive to light. Extraocular movements were full, visual field were full  Motor: The motor testing reveals 5 over 5 strength of all 4 extremities. Good symmetric motor tone is noted throughout.  Gait and station: Gait is wide but stable    DIAGNOSTIC DATA (LABS, IMAGING, TESTING) - I reviewed patient records, labs, notes, testing and imaging myself where available.  No flowsheet data found.   Lab Results  Component Value Date   WBC 11.4 (H) 11/16/2019   HGB 11.0 (L) 11/16/2019   HCT 33.5 (L) 11/16/2019   MCV 93.3 11/16/2019   PLT 412 (H) 11/16/2019      Component Value Date/Time   NA 139 11/19/2019 0904   NA 138 04/09/2019 0822   K 3.9 11/19/2019 0904   CL 100 11/19/2019 0904   CO2 29 11/19/2019 0904   GLUCOSE 105 (H) 11/19/2019 0904   BUN 49 (H) 11/19/2019 0904   BUN 20 04/09/2019 0822    CREATININE 1.75 (H) 11/19/2019 0904   CREATININE 1.05 12/25/2018 1700   CALCIUM 9.0 11/19/2019 0904   PROT 5.9 (L) 11/08/2019 0503   PROT 4.3 (LL) 04/01/2019 1246   ALBUMIN 2.1 (L) 11/19/2019 0904   ALBUMIN 2.0 (L) 04/01/2019 1246   AST 13 (L) 11/08/2019 0503   ALT 14 11/08/2019 0503   ALKPHOS 49 11/08/2019 0503   BILITOT 0.6 11/08/2019 0503   BILITOT <0.2 04/01/2019 1246   GFRNONAA 43 (L) 11/19/2019 0904   GFRAA 49 (L) 11/19/2019 0904   Lab Results  Component Value Date   CHOL 265 (H) 12/25/2018   HDL 52 12/25/2018   [place]  12/25/2018     Comment:     . LDL cholesterol not calculated. Triglyceride levels greater than 400 mg/dL invalidate calculated LDL results. . Reference range: <100 . Desirable range <100 mg/dL for primary prevention;   <70 mg/dL for patients with CHD or diabetic patients  with > or = 2 CHD risk factors. Marland Kitchen LDL-C is now calculated using the Martin-Hopkins  calculation, which is a validated novel method providing  better accuracy than the Friedewald equation in the  estimation of LDL-C.  Cresenciano Genre et al. Annamaria Helling. 8416;606(30): 2061-2068  (http://education.[facility].com/faq/FAQ164)    TRIG 419 (  H) 12/25/2018   CHOLHDL 5.1 (H) 12/25/2018   Lab Results  Component Value Date   HGBA1C 6.1 (H) 12/25/2018   No results found for: [id number] Lab Results  Component Value Date   TSH 8.620 (H) 04/01/2019       ASSESSMENT AND PLAN 59 y.o. year old male  has a past medical history of Anxiety, Asthma, Depression, Hypertension, and Nephrotic syndrome. here with     ICD-10-CM   1. OSA on CPAP  G47.33 For home use only DME continuous positive airway pressure (CPAP)   Z99.89      Jonni Sanger is doing well with CPAP compliance.  Compliance report reveals daily and 4-hour compliance of 100%.  I have commended him on his excellent compliance and encouraged him to continue using CPAP nightly and for greater than 4 hours each night.  Residual AHI is 6 events per  hour.  I will increase maximum pressure from 16 to 17 cm of water pressure.  He will continue to monitor for correct mask seal and headgear placement at home.  He has not been able to tolerate chinstrap.  He may consider switching back to silicone fullface mask to see if this is a better fit for him.  I suspect that his facial hair is most likely contributing as well.  I will repeat CPAP download in 4 to 6 weeks to assess response and AHI following pressure changes.  Healthy lifestyle habits encouraged.  He will follow-up with [place] in 1 year, sooner if needed.  He verbalizes understanding and agreement with this plan.   Orders Placed This Encounter  Procedures  . For home use only DME continuous positive airway pressure (CPAP)    Please adjust maximum pressure setting to 17cmH20.    Order Specific Question:   Length of Need    Answer:   Lifetime    Order Specific Question:   Patient has OSA or probable OSA    Answer:   Yes    Order Specific Question:   Is the patient currently using CPAP in the home    Answer:   Yes    Order Specific Question:   Settings    Answer:   Other see comments    Order Specific Question:   CPAP supplies needed    Answer:   Mask, headgear, cushions, filters, heated tubing and water chamber     No orders of the defined types were placed in this encounter.     I spent 15 minutes with the patient. 50% of this time was spent counseling and educating patient on plan of care and medications.    Debbora Presto, FNP-C 03/10/2020, 4:04 PM [facility] [address], [place], [place] [address] [phone]  I reviewed the above note and documentation by the Nurse Practitioner and agree with the history, exam, assessment and plan as outlined above. I was available for consultation. Star Age, MD, PhD [facility])

## 2020-03-11 NOTE — Progress Notes (Signed)
Order for pressure adjustment sent to [facility] via community msg. Confirmation received that the order transmitted was successful.

## 2020-03-16 DIAGNOSIS — G4733 Obstructive sleep apnea (adult) (pediatric): Secondary | ICD-10-CM | POA: Diagnosis not present

## 2020-03-17 DIAGNOSIS — F39 Unspecified mood [affective] disorder: Secondary | ICD-10-CM | POA: Diagnosis not present

## 2020-03-26 DIAGNOSIS — N1831 Chronic kidney disease, stage 3a: Secondary | ICD-10-CM | POA: Diagnosis not present

## 2020-03-27 DIAGNOSIS — N1831 Chronic kidney disease, stage 3a: Secondary | ICD-10-CM | POA: Diagnosis not present

## 2020-03-31 ENCOUNTER — Encounter: Payer: Self-pay | Admitting: Family Medicine

## 2020-03-31 ENCOUNTER — Ambulatory Visit (INDEPENDENT_AMBULATORY_CARE_PROVIDER_SITE_OTHER): Payer: BC Managed Care – PPO | Admitting: Family Medicine

## 2020-03-31 ENCOUNTER — Other Ambulatory Visit: Payer: Self-pay

## 2020-03-31 VITALS — BP 131/86 | HR 98 | Ht 68.0 in | Wt 270.6 lb

## 2020-03-31 DIAGNOSIS — F39 Unspecified mood [affective] disorder: Secondary | ICD-10-CM | POA: Diagnosis not present

## 2020-03-31 DIAGNOSIS — N049 Nephrotic syndrome with unspecified morphologic changes: Secondary | ICD-10-CM | POA: Diagnosis not present

## 2020-03-31 DIAGNOSIS — F320 Major depressive disorder, single episode, mild: Secondary | ICD-10-CM | POA: Diagnosis not present

## 2020-03-31 DIAGNOSIS — Z0000 Encounter for general adult medical examination without abnormal findings: Secondary | ICD-10-CM

## 2020-03-31 DIAGNOSIS — G4733 Obstructive sleep apnea (adult) (pediatric): Secondary | ICD-10-CM

## 2020-03-31 DIAGNOSIS — M1A09X0 Idiopathic chronic gout, multiple sites, without tophus (tophi): Secondary | ICD-10-CM

## 2020-03-31 DIAGNOSIS — I10 Essential (primary) hypertension: Secondary | ICD-10-CM | POA: Diagnosis not present

## 2020-03-31 DIAGNOSIS — E785 Hyperlipidemia, unspecified: Secondary | ICD-10-CM

## 2020-03-31 DIAGNOSIS — Z9989 Dependence on other enabling machines and devices: Secondary | ICD-10-CM

## 2020-03-31 DIAGNOSIS — R7982 Elevated C-reactive protein (CRP): Secondary | ICD-10-CM

## 2020-03-31 DIAGNOSIS — R739 Hyperglycemia, unspecified: Secondary | ICD-10-CM

## 2020-03-31 DIAGNOSIS — Z6841 Body Mass Index (BMI) 40.0 and over, adult: Secondary | ICD-10-CM | POA: Insufficient documentation

## 2020-03-31 DIAGNOSIS — R7989 Other specified abnormal findings of blood chemistry: Secondary | ICD-10-CM

## 2020-03-31 NOTE — Progress Notes (Signed)
Office Visit Note   Patient: Darren Hamilton           Date of Birth: 1961-04-09           MRN: [id number] Visit Date: 03/31/2020 Requested by: Lavada Mesi, MD [address] [place],  [place] [address] PCP: Lavada Mesi, MD  Subjective: Chief Complaint  Patient presents with  . Annual Exam    HPI: He is here for annual wellness exam.  Overall he is feeling better.  He was hospitalized recently for severe bilateral leg pain and was negative for DVT, found to have very elevated uric acid level and was treated for a gout attack.  He also had acute respiratory failure due to diastolic heart failure with volume overload contribution from nephrotic syndrome.  He had acute kidney injury as well.  He was in the ICU for about 5 days.  This was in June.  He still has leg edema and fluid retention but his creatinine has improved.  He had metabolic panel and CBC recently.  Hypertension has been well controlled.  Depression is doing well on Lexapro.  He is on Lopid for hyperlipidemia.  Previous labs have been notable for hyperglycemia and elevated TSH, as well as elevated C-reactive protein.  He had a normal colonoscopy about 5 years ago.  He is up-to-date on eye exams and dental exams.  He will see his dermatologist next week.               ROS: He is having some loose stools since his hospitalization.  All other systems were reviewed and are negative.  Objective: Vital Signs: BP 131/86   Pulse 98   Ht 5\' 8"  (1.727 m)   Wt 270 lb 9.6 oz (122.7 kg)   BMI 41.14 kg/m   Physical Exam:  General:  Alert and oriented, in no acute distress. Pulm:  Breathing unlabored. Psy:  Normal mood, congruent affect. Skin: There is a flesh-colored nevus on the back of his neck. HEENT:  [place]/AT, PERRLA, EOM Full, no nystagmus.  Funduscopic examination within normal limits.  No conjunctival erythema.  Tympanic membranes are pearly gray with normal landmarks.  External ear canals are normal.  Nasal  passages are clear.  Oropharynx is clear.  No significant lymphadenopathy.  No thyromegaly or nodules.  2+ carotid pulses without bruits. CV: Regular rate and rhythm without murmurs, rubs, or gallops.  1-2+ peripheral edema.  2+ radial and posterior tibial pulses. Lungs: Clear to auscultation throughout with no wheezing or areas of consolidation. Abd: Bowel sounds are active, no hepatosplenomegaly or masses.  Soft and nontender.  No audible bruits.  No evidence of ascites.   Imaging: No results found.  Assessment & Plan: 1.  Wellness examination -We will draw some additional labs today.  2.  Hypertension well controlled  3.  Depression, well controlled.  4.  Hyperlipidemia  5.  Morbid obesity  6.  Gout -Recheck uric acid level.  7.  Elevated TSH -Recheck today.     Procedures: No procedures performed  No notes on file     PMFS History: Patient Active Problem List   Diagnosis Date Noted  . OSA on CPAP 03/31/2020  . Morbid obesity with BMI of 40.0-44.9, adult (HCC) 03/31/2020  . Idiopathic chronic gout of multiple sites without tophus 03/31/2020  . Cellulitis 11/08/2019  . Cellulitis of left lower extremity without foot 11/07/2019  . Nephrotic syndrome 11/07/2019  . Seasonal allergies 06/26/2018  . Hyperlipidemia 06/26/2018  . Essential  hypertension 06/26/2018  . Depression 11/09/2014   Past Medical History:  Diagnosis Date  . Anxiety   . Asthma   . Depression   . Hypertension   . Nephrotic syndrome     Family History  Problem Relation Age of Onset  . Heart failure Mother   . Heart failure Father   . Celiac disease Sister   . Diabetes Maternal Grandmother   . Heart disease Maternal Grandmother   . Kidney failure Maternal Grandfather   . Lung cancer Paternal Grandmother   . Cancer Paternal Grandmother   . Heart disease Paternal Grandfather   . Celiac disease Other   . Prostate cancer Neg Hx   . Colon cancer Neg Hx     Past Surgical History:   Procedure Laterality Date  . RENAL BIOPSY     Social History   Occupational History  . Not on file  Tobacco Use  . Smoking status: Never Smoker  . Smokeless tobacco: Never Used  Vaping Use  . Vaping Use: Never used  Substance and Sexual Activity  . Alcohol use: Yes    Alcohol/week: 3.0 standard drinks    Types: 1 Glasses of wine, 1 Cans of beer, 1 Shots of liquor per week    Comment: occasionally  . Drug use: No  . Sexual activity: Not on file

## 2020-04-01 ENCOUNTER — Telehealth: Payer: Self-pay | Admitting: Family Medicine

## 2020-04-01 DIAGNOSIS — D631 Anemia in chronic kidney disease: Secondary | ICD-10-CM | POA: Diagnosis not present

## 2020-04-01 DIAGNOSIS — I129 Hypertensive chronic kidney disease with stage 1 through stage 4 chronic kidney disease, or unspecified chronic kidney disease: Secondary | ICD-10-CM | POA: Diagnosis not present

## 2020-04-01 DIAGNOSIS — N1831 Chronic kidney disease, stage 3a: Secondary | ICD-10-CM | POA: Diagnosis not present

## 2020-04-01 DIAGNOSIS — N041 Nephrotic syndrome with focal and segmental glomerular lesions: Secondary | ICD-10-CM | POA: Diagnosis not present

## 2020-04-01 LAB — HEMOGLOBIN A1C
Hgb A1c MFr Bld: 6 % of total Hgb — ABNORMAL HIGH (ref <5.7)
Mean Plasma Glucose: 126 (calc)
eAG (mmol/L): 7 (calc)

## 2020-04-01 LAB — THYROID PANEL WITH TSH
Free Thyroxine Index: 1.4 (ref 1.4–3.8)
T3 Uptake: 32 % (ref 22–35)
T4, Total: 4.3 ug/dL — ABNORMAL LOW (ref 4.9–10.5)
TSH: 4.64 mIU/L — ABNORMAL HIGH (ref 0.40–4.50)

## 2020-04-01 LAB — LIPID PANEL
Cholesterol: 495 mg/dL — ABNORMAL HIGH (ref <200)
HDL: 52 mg/dL (ref >=40)
Non-HDL Cholesterol (Calc): 443 mg/dL (calc) — ABNORMAL HIGH (ref <130)
Total CHOL/HDL Ratio: 9.5 (calc) — ABNORMAL HIGH (ref <5.0)
Triglycerides: 610 mg/dL — ABNORMAL HIGH (ref <150)

## 2020-04-01 LAB — HIGH SENSITIVITY CRP: hs-CRP: 2.2 mg/L

## 2020-04-01 LAB — PSA: PSA: 0.69 ng/mL (ref <=4.0)

## 2020-04-01 LAB — URIC ACID: Uric Acid, Serum: 10.6 mg/dL — ABNORMAL HIGH (ref 4.0–8.0)

## 2020-04-01 NOTE — Telephone Encounter (Signed)
Labs show:  Uric acid still too high at 10.6 (goal is less than 7).  May need to increase allopurinol dosage, but would check with nephrology first to be sure it's ok for kidney function.  CRP looks good.  A1C looks a bit better.  Lipids are very high (Total cholesterol and TG).  Thyroid dysfunction can contribute to this.  May also need to consider a statin.  PSA is normal.  Thyroid is abnormal still with TSH of 4.64.  This could explain fatigue, and may increase the swelling issues.  Could consider starting Synthroid if you'd like.

## 2020-04-14 DIAGNOSIS — F39 Unspecified mood [affective] disorder: Secondary | ICD-10-CM | POA: Diagnosis not present

## 2020-04-27 ENCOUNTER — Telehealth: Payer: Self-pay | Admitting: Family Medicine

## 2020-04-27 NOTE — Telephone Encounter (Signed)
Attempted to call the patient without success.  LM on the VM for the patient to call back VV:OHYW leakage  **If the patient calls back please xfer the call to myself or [place]**

## 2020-04-27 NOTE — Telephone Encounter (Signed)
Please let him know that I have reviewed his most recent download after pressure adjustments. His apneic events are still a little elevated at 6.6 and leak is similar to his last visit at 47 L/min. I know he mentioned trying his old silicone full face mask. Did he switch masks? I will be happy to send him for a mask refitting if he would like. Otherwise, have him continue to use CPAP nightly and greater than 4 hours each night. I will reprint download in 2-3 months and assess need for sooner follow up at that time.

## 2020-04-28 DIAGNOSIS — F39 Unspecified mood [affective] disorder: Secondary | ICD-10-CM | POA: Diagnosis not present

## 2020-04-28 NOTE — Telephone Encounter (Signed)
Pt called back and has been notified of the message below

## 2020-05-06 DIAGNOSIS — L814 Other melanin hyperpigmentation: Secondary | ICD-10-CM | POA: Diagnosis not present

## 2020-05-06 DIAGNOSIS — L821 Other seborrheic keratosis: Secondary | ICD-10-CM | POA: Diagnosis not present

## 2020-05-06 DIAGNOSIS — D225 Melanocytic nevi of trunk: Secondary | ICD-10-CM | POA: Diagnosis not present

## 2020-05-06 DIAGNOSIS — D2261 Melanocytic nevi of right upper limb, including shoulder: Secondary | ICD-10-CM | POA: Diagnosis not present

## 2020-05-08 ENCOUNTER — Other Ambulatory Visit (INDEPENDENT_AMBULATORY_CARE_PROVIDER_SITE_OTHER): Payer: Self-pay | Admitting: Family Medicine

## 2020-05-12 DIAGNOSIS — F39 Unspecified mood [affective] disorder: Secondary | ICD-10-CM | POA: Diagnosis not present

## 2020-05-26 DIAGNOSIS — F39 Unspecified mood [affective] disorder: Secondary | ICD-10-CM | POA: Diagnosis not present

## 2020-05-31 NOTE — Progress Notes (Addendum)
Cardiology Office Note:    Date:  06/02/2020   ID:  Darren Hamilton, DOB 02-Apr-1961, MRN [id number]  PCP:  Lavada Mesi, MD  Cardiologist:  No primary care provider on file.  Electrophysiologist:  None   Referring MD: Lavada Mesi, MD   Chief Complaint  Patient presents with  . Congestive Heart Failure         History of Present Illness:    Darren Hamilton is a 59 y.o. male with a hx of hypertension, hyperlipidemia, OSA, diastolic dysfunction, nephrotic syndrome who presents for follow-up.  Patient presented to the [facility] ED on 02/24/2019 with complaint of leg swelling.  Lower extremity duplex showed no evidence of DVT.  BNP was over 1100.  He was started on Lasix 20 mg daily and discharged from the ED.  He did not respond to Lasix and this was changed to torsemide 20 mg BID as an outpatient.  TTE on 02/25/2019 showed normal LV systolic function, grade 1 diastolic dysfunction, mild LVH, normal RV size and function, no significant valvular disease, small/collapsible IVC.   He was referred to cardiology for initial visit on 04/01/2019.  Given his presentation with only mild diastolic dysfunction with significant volume overloaded, labs were checked and showed significant reduction in albumin (2.0) with 4+ proteinuria on urinalysis.  Spot protein to creatinine ratio showed nephrotic range proteinuria.  He was referred to nephrology for further evaluation.  Underwent renal biopsy, which showed FSGS.    Since last clinic visit, he was hospitalized in June 2021 with worsening leg pain.  Was in hypoxic respiratory failure due to acute diastolic heart failure and volume overload from nephrotic syndrome.  Improved with IV diuresis.  Also found to have AKI, which improved with diuresis.  He was treated for left lower extremity cellulitis/ruptured Baker's cyst and gout flare.  He reports since discharge from the hospital, he has been doing okay.  Reports weight has been stable.  Reports occasional  shortness of breath, denies any chest pain.  Denies any lightheadedness or syncope.   Wt Readings from Last 3 Encounters:  06/02/20 270 lb 9.6 oz (122.7 kg)  03/31/20 270 lb 9.6 oz (122.7 kg)  03/10/20 276 lb (125.2 kg)     Past Medical History:  Diagnosis Date  . Anxiety   . Asthma   . Depression   . Hypertension   . Nephrotic syndrome     Past Surgical History:  Procedure Laterality Date  . RENAL BIOPSY      Current Medications: Current Meds  Medication Sig  . acetaminophen (TYLENOL) 500 MG tablet Take 500-1,000 mg by mouth every 6 (six) hours as needed for mild pain or moderate pain.  Marland Kitchen albuterol (VENTOLIN HFA) 108 (90 Base) MCG/ACT inhaler TAKE 2 PUFFS BY MOUTH EVERY 6 HOURS AS NEEDED FOR WHEEZE OR SHORTNESS OF BREATH  . allopurinol (ZYLOPRIM) 100 MG tablet Take 100 mg by mouth 2 (two) times daily.  . cetirizine (ZYRTEC) 10 MG tablet Take 10 mg by mouth daily.  . Cholecalciferol (DIALYVITE VITAMIN D 5000 PO) Take 5,000 Units by mouth daily.   . colchicine 0.6 MG tablet Take 1 tablet (0.6 mg total) by mouth daily.  Marland Kitchen escitalopram (LEXAPRO) 20 MG tablet Take 1 tablet (20 mg total) by mouth daily. START 1/2 TAB DAILY, MAY INCREASE TO 1 DAILY AFTER 1 WEEK  . FARXIGA 10 MG TABS tablet Take 10 mg by mouth daily.  Marland Kitchen gemfibrozil (LOPID) 600 MG tablet TAKE 1 TABLET (600 MG TOTAL)  BY MOUTH 2 (TWO) TIMES DAILY BEFORE A MEAL. (Patient taking differently: Take 600 mg by mouth 2 (two) times daily with a meal.)  . lisinopril (ZESTRIL) 10 MG tablet TAKE 1 TABLET BY MOUTH EVERY DAY  . tacrolimus (PROGRAF) 1 MG capsule Take 2 mg by mouth 2 (two) times daily.  Marland Kitchen torsemide (DEMADEX) 100 MG tablet Take 100 mg by mouth daily.     Allergies:   Patient has no known allergies.   Social History   Socioeconomic History  . Marital status: Single    Spouse name: Not on file  . Number of children: Not on file  . Years of education: Not on file  . Highest education level: Not on file   Occupational History  . Not on file  Tobacco Use  . Smoking status: Never Smoker  . Smokeless tobacco: Never Used  Vaping Use  . Vaping Use: Never used  Substance and Sexual Activity  . Alcohol use: Yes    Alcohol/week: 3.0 standard drinks    Types: 1 Glasses of wine, 1 Cans of beer, 1 Shots of liquor per week    Comment: occasionally  . Drug use: No  . Sexual activity: Not on file  Other Topics Concern  . Not on file  Social History Narrative  . Not on file   Social Determinants of Health   Financial Resource Strain: Not on file  Food Insecurity: Not on file  Transportation Needs: Not on file  Physical Activity: Not on file  Stress: Not on file  Social Connections: Not on file     Family History: Father was diagnosed with CAD in early 52s, underwent CABG and AVR.    ROS:   Please see the history of present illness.     All other systems reviewed and are negative.  EKGs/Labs/Other Studies Reviewed:    The following studies were reviewed today:   EKG:  EKG is  ordered today.  The ekg ordered today demonstrates sinus tachycardia, rate 101, poor R wave progression, Q waves in leads III, aVF  TTE 02/25/19: 1. Left ventricular ejection fraction, by visual estimation, is 55 to 60%. The left ventricle has normal function. There is mildly increased left ventricular hypertrophy.  2. The mitral valve is normal in structure. No evidence of mitral valve regurgitation. No evidence of mitral stenosis.  3. The aortic valve is tricuspid Aortic valve regurgitation was not visualized by color flow Doppler. Mild aortic valve sclerosis without stenosis.  4. There is mild dilatation of the ascending aorta measuring 38 mm.  5. Global right ventricle has normal systolic function.The right ventricular size is normal. No increase in right ventricular wall thickness.  6. Left atrial size was normal.  7. Right atrial size was normal.  8. Left ventricular diastolic Doppler parameters are  consistent with impaired relaxation pattern of LV diastolic filling.  9. The inferior vena cava is normal in size with greater than 50% respiratory variability, suggesting right atrial pressure of 3 mmHg. 10. The tricuspid valve is normal in structure. Tricuspid valve regurgitation was not visualized by color flow Doppler.  Recent Labs: 11/08/2019: ALT 14 11/16/2019: Hemoglobin 11.0; Platelets 412 11/19/2019: BUN 49; Creatinine, Ser 1.75; Potassium 3.9; Sodium 139 03/31/2020: TSH 4.64  Recent Lipid Panel    Component Value Date/Time   CHOL 495 (H) 03/31/2020 1342   TRIG 610 (H) 03/31/2020 1342   HDL 52 03/31/2020 1342   CHOLHDL 9.5 (H) 03/31/2020 1342   LDLCALC  03/31/2020 1342  Comment:     . LDL cholesterol not calculated. Triglyceride levels greater than 400 mg/dL invalidate calculated LDL results. . Reference range: <100 . Desirable range <100 mg/dL for primary prevention;   <70 mg/dL for patients with CHD or diabetic patients  with > or = 2 CHD risk factors. Marland Kitchen LDL-C is now calculated using the Martin-Hopkins  calculation, which is a validated novel method providing  better accuracy than the Friedewald equation in the  estimation of LDL-C.  Horald Pollen et al. Lenox Ahr. 8099;833(82): 2061-2068  (http://education.[employer].com/faq/FAQ164)     Physical Exam:    VS:  BP 112/82   Pulse (!) 101   Ht 5\' 8"  (1.727 m)   Wt 270 lb 9.6 oz (122.7 kg)   SpO2 93%   BMI 41.14 kg/m     Wt Readings from Last 3 Encounters:  06/02/20 270 lb 9.6 oz (122.7 kg)  03/31/20 270 lb 9.6 oz (122.7 kg)  03/10/20 276 lb (125.2 kg)     GEN:   in no acute distress HEENT: Normal NECK: No JVD  CARDIAC: RRR, no murmurs, rubs, gallops RESPIRATORY:  Clear to auscultation without rales, wheezing or rhonchi  ABDOMEN: Soft, non-tender, non-distended MUSCULOSKELETAL:  Trace LE edema SKIN: Warm and dry NEUROLOGIC:  Alert and oriented x 3 PSYCHIATRIC:  Normal affect   ASSESSMENT:    1.  Lower extremity edema   2. Hyperlipidemia, unspecified hyperlipidemia type   3. Essential hypertension   4. SOB (shortness of breath)    PLAN:     LE Edema/dyspnea: Due to nephrotic syndrome, with renal biopsy showing FSGS.  He is following with nephrology and is on tacrolimus.  Currently taking torsemide 100 mg daily.  Weight has been stable.  Mild diastolic dysfunction on TTE, do not suspect it is contributing much to his presentation.  Continue diuretics per nephrology.  Hypertension: On lisinopril 10 mg daily.  Appears controlled  Hyperlipidemia: Gemfibrozil 600 mg twice daily.  Labs on 03/31/2020 showed triglycerides 610.  Will recheck fasting lipid panel  OSA: continue APAP  RTC in 1 year   Medication Adjustments/Labs and Tests Ordered: Current medicines are reviewed at length with the patient today.  Concerns regarding medicines are outlined above.  Orders Placed This Encounter  Procedures  . Lipid panel   No orders of the defined types were placed in this encounter.   Patient Instructions  Medication Instructions:  Your physician recommends that you continue on your current medications as directed. Please refer to the Current Medication list given to you today.  *If you need a refill on your cardiac medications before your next appointment, please call your pharmacy*   Lab Work: Return for FASTING Lipid panel-order provided  Follow-Up: At [facility], you and your health needs are our priority.  As part of our continuing mission to provide you with exceptional heart care, we have created designated Provider Care Teams.  These Care Teams include your primary Cardiologist (physician) and Advanced Practice Providers (APPs -  Physician Assistants and Nurse Practitioners) who all work together to provide you with the care you need, when you need it.  We recommend signing up for the patient portal called "MyChart".  Sign up information is provided on this After Visit  Summary.  MyChart is used to connect with patients for Virtual Visits (Telemedicine).  Patients are able to view lab/test results, encounter notes, upcoming appointments, etc.  Non-urgent messages can be sent to your provider as well.   To learn more about what  you can do with MyChart, go to [email or web address].    Your next appointment:   12 month(s)  The format for your next appointment:   In Person  Provider:   Epifanio Lescheshristopher Charleston Vierling, MD       Signed, [place] L Rozelle Caudle, MD  06/02/2020 5:18 PM    [facility]

## 2020-06-02 ENCOUNTER — Ambulatory Visit: Payer: BC Managed Care – PPO | Admitting: Cardiology

## 2020-06-02 ENCOUNTER — Encounter: Payer: Self-pay | Admitting: Cardiology

## 2020-06-02 ENCOUNTER — Other Ambulatory Visit: Payer: Self-pay

## 2020-06-02 VITALS — BP 112/82 | HR 101 | Ht 68.0 in | Wt 270.6 lb

## 2020-06-02 DIAGNOSIS — I10 Essential (primary) hypertension: Secondary | ICD-10-CM

## 2020-06-02 DIAGNOSIS — R0602 Shortness of breath: Secondary | ICD-10-CM

## 2020-06-02 DIAGNOSIS — E785 Hyperlipidemia, unspecified: Secondary | ICD-10-CM | POA: Diagnosis not present

## 2020-06-02 DIAGNOSIS — R600 Localized edema: Secondary | ICD-10-CM

## 2020-06-02 NOTE — Patient Instructions (Signed)
Medication Instructions:  Your physician recommends that you continue on your current medications as directed. Please refer to the Current Medication list given to you today.  *If you need a refill on your cardiac medications before your next appointment, please call your pharmacy*   Lab Work: Return for FASTING Lipid panel-order provided  Follow-Up: At [facility], you and your health needs are our priority.  As part of our continuing mission to provide you with exceptional heart care, we have created designated Provider Care Teams.  These Care Teams include your primary Cardiologist (physician) and Advanced Practice Providers (APPs -  Physician Assistants and Nurse Practitioners) who all work together to provide you with the care you need, when you need it.  We recommend signing up for the patient portal called "MyChart".  Sign up information is provided on this After Visit Summary.  MyChart is used to connect with patients for Virtual Visits (Telemedicine).  Patients are able to view lab/test results, encounter notes, upcoming appointments, etc.  Non-urgent messages can be sent to your provider as well.   To learn more about what you can do with MyChart, go to [email or web address].    Your next appointment:   12 month(s)  The format for your next appointment:   In Person  Provider:   Epifanio Lesches, MD

## 2020-06-03 DIAGNOSIS — N1831 Chronic kidney disease, stage 3a: Secondary | ICD-10-CM | POA: Diagnosis not present

## 2020-06-03 NOTE — Addendum Note (Signed)
Addended by: Dorris Fetch on: 06/03/2020 02:19 PM   Modules accepted: Orders

## 2020-06-09 DIAGNOSIS — F39 Unspecified mood [affective] disorder: Secondary | ICD-10-CM | POA: Diagnosis not present

## 2020-06-10 DIAGNOSIS — N1831 Chronic kidney disease, stage 3a: Secondary | ICD-10-CM | POA: Diagnosis not present

## 2020-06-10 DIAGNOSIS — N041 Nephrotic syndrome with focal and segmental glomerular lesions: Secondary | ICD-10-CM | POA: Diagnosis not present

## 2020-06-10 DIAGNOSIS — I129 Hypertensive chronic kidney disease with stage 1 through stage 4 chronic kidney disease, or unspecified chronic kidney disease: Secondary | ICD-10-CM | POA: Diagnosis not present

## 2020-06-10 DIAGNOSIS — D631 Anemia in chronic kidney disease: Secondary | ICD-10-CM | POA: Diagnosis not present

## 2020-06-16 DIAGNOSIS — G4733 Obstructive sleep apnea (adult) (pediatric): Secondary | ICD-10-CM | POA: Diagnosis not present

## 2020-06-23 ENCOUNTER — Telehealth: Payer: Self-pay | Admitting: Family Medicine

## 2020-06-23 NOTE — Telephone Encounter (Signed)
Please let Mr Gulla know that I have reviewed his download. The overall all apneic events look better, now at 5.2, previously 6.6. Compliance is excellent. I do see that leak continues to be a concern. Did he switch to silicone mask? Remind him to monitor for leak at home and reach out to DME for any concerns of continued leak. Everything else looks great.

## 2020-06-23 NOTE — Telephone Encounter (Signed)
I called Pt regarding CPAP compliance from Amy. Pt didn't answer, left VM asking him to give [place] a call back

## 2020-07-06 DIAGNOSIS — E785 Hyperlipidemia, unspecified: Secondary | ICD-10-CM | POA: Diagnosis not present

## 2020-07-06 DIAGNOSIS — N1831 Chronic kidney disease, stage 3a: Secondary | ICD-10-CM | POA: Diagnosis not present

## 2020-07-07 DIAGNOSIS — F39 Unspecified mood [affective] disorder: Secondary | ICD-10-CM | POA: Diagnosis not present

## 2020-07-07 LAB — LIPID PANEL
Chol/HDL Ratio: 9 ratio — ABNORMAL HIGH (ref 0.0–5.0)
Cholesterol, Total: 404 mg/dL — ABNORMAL HIGH (ref 100–199)
HDL: 45 mg/dL (ref >39)
LDL Chol Calc (NIH): 213 mg/dL — ABNORMAL HIGH (ref 0–99)
Triglycerides: 642 mg/dL (ref 0–149)
VLDL Cholesterol Cal: 146 mg/dL — ABNORMAL HIGH (ref 5–40)

## 2020-07-10 ENCOUNTER — Other Ambulatory Visit: Payer: Self-pay | Admitting: *Deleted

## 2020-07-10 DIAGNOSIS — E785 Hyperlipidemia, unspecified: Secondary | ICD-10-CM

## 2020-07-10 MED ORDER — ROSUVASTATIN CALCIUM 20 MG PO TABS
20.0000 mg | ORAL_TABLET | Freq: Every day | ORAL | 3 refills | Status: DC
Start: 2020-07-10 — End: 2021-04-02

## 2020-07-21 DIAGNOSIS — F39 Unspecified mood [affective] disorder: Secondary | ICD-10-CM | POA: Diagnosis not present

## 2020-08-04 DIAGNOSIS — F39 Unspecified mood [affective] disorder: Secondary | ICD-10-CM | POA: Diagnosis not present

## 2020-08-17 ENCOUNTER — Other Ambulatory Visit (INDEPENDENT_AMBULATORY_CARE_PROVIDER_SITE_OTHER): Payer: Self-pay | Admitting: Family Medicine

## 2020-08-18 DIAGNOSIS — F39 Unspecified mood [affective] disorder: Secondary | ICD-10-CM | POA: Diagnosis not present

## 2020-08-24 DIAGNOSIS — N1831 Chronic kidney disease, stage 3a: Secondary | ICD-10-CM | POA: Diagnosis not present

## 2020-09-01 DIAGNOSIS — F39 Unspecified mood [affective] disorder: Secondary | ICD-10-CM | POA: Diagnosis not present

## 2020-09-04 DIAGNOSIS — I129 Hypertensive chronic kidney disease with stage 1 through stage 4 chronic kidney disease, or unspecified chronic kidney disease: Secondary | ICD-10-CM | POA: Diagnosis not present

## 2020-09-04 DIAGNOSIS — N041 Nephrotic syndrome with focal and segmental glomerular lesions: Secondary | ICD-10-CM | POA: Diagnosis not present

## 2020-09-04 DIAGNOSIS — D631 Anemia in chronic kidney disease: Secondary | ICD-10-CM | POA: Diagnosis not present

## 2020-09-04 DIAGNOSIS — N1831 Chronic kidney disease, stage 3a: Secondary | ICD-10-CM | POA: Diagnosis not present

## 2020-09-15 DIAGNOSIS — F39 Unspecified mood [affective] disorder: Secondary | ICD-10-CM | POA: Diagnosis not present

## 2020-10-02 DIAGNOSIS — N1831 Chronic kidney disease, stage 3a: Secondary | ICD-10-CM | POA: Diagnosis not present

## 2020-10-06 DIAGNOSIS — F39 Unspecified mood [affective] disorder: Secondary | ICD-10-CM | POA: Diagnosis not present

## 2020-10-23 DIAGNOSIS — N1831 Chronic kidney disease, stage 3a: Secondary | ICD-10-CM | POA: Diagnosis not present

## 2020-10-24 DIAGNOSIS — N1831 Chronic kidney disease, stage 3a: Secondary | ICD-10-CM | POA: Diagnosis not present

## 2020-10-27 DIAGNOSIS — F39 Unspecified mood [affective] disorder: Secondary | ICD-10-CM | POA: Diagnosis not present

## 2020-10-28 DIAGNOSIS — I129 Hypertensive chronic kidney disease with stage 1 through stage 4 chronic kidney disease, or unspecified chronic kidney disease: Secondary | ICD-10-CM | POA: Diagnosis not present

## 2020-10-28 DIAGNOSIS — N041 Nephrotic syndrome with focal and segmental glomerular lesions: Secondary | ICD-10-CM | POA: Diagnosis not present

## 2020-10-28 DIAGNOSIS — D631 Anemia in chronic kidney disease: Secondary | ICD-10-CM | POA: Diagnosis not present

## 2020-10-28 DIAGNOSIS — N1831 Chronic kidney disease, stage 3a: Secondary | ICD-10-CM | POA: Diagnosis not present

## 2020-11-10 DIAGNOSIS — F39 Unspecified mood [affective] disorder: Secondary | ICD-10-CM | POA: Diagnosis not present

## 2020-11-24 DIAGNOSIS — F39 Unspecified mood [affective] disorder: Secondary | ICD-10-CM | POA: Diagnosis not present

## 2020-11-27 DIAGNOSIS — N1831 Chronic kidney disease, stage 3a: Secondary | ICD-10-CM | POA: Diagnosis not present

## 2020-12-01 ENCOUNTER — Other Ambulatory Visit: Payer: Self-pay | Admitting: Family Medicine

## 2020-12-08 DIAGNOSIS — F39 Unspecified mood [affective] disorder: Secondary | ICD-10-CM | POA: Diagnosis not present

## 2021-01-05 DIAGNOSIS — F39 Unspecified mood [affective] disorder: Secondary | ICD-10-CM | POA: Diagnosis not present

## 2021-01-07 DIAGNOSIS — G4733 Obstructive sleep apnea (adult) (pediatric): Secondary | ICD-10-CM | POA: Diagnosis not present

## 2021-01-19 DIAGNOSIS — F39 Unspecified mood [affective] disorder: Secondary | ICD-10-CM | POA: Diagnosis not present

## 2021-02-02 DIAGNOSIS — F39 Unspecified mood [affective] disorder: Secondary | ICD-10-CM | POA: Diagnosis not present

## 2021-02-03 DIAGNOSIS — F840 Autistic disorder: Secondary | ICD-10-CM | POA: Diagnosis not present

## 2021-02-03 DIAGNOSIS — F411 Generalized anxiety disorder: Secondary | ICD-10-CM | POA: Diagnosis not present

## 2021-02-03 DIAGNOSIS — F6381 Intermittent explosive disorder: Secondary | ICD-10-CM | POA: Diagnosis not present

## 2021-02-09 DIAGNOSIS — N1831 Chronic kidney disease, stage 3a: Secondary | ICD-10-CM | POA: Diagnosis not present

## 2021-02-16 DIAGNOSIS — F39 Unspecified mood [affective] disorder: Secondary | ICD-10-CM | POA: Diagnosis not present

## 2021-02-17 DIAGNOSIS — D631 Anemia in chronic kidney disease: Secondary | ICD-10-CM | POA: Diagnosis not present

## 2021-02-17 DIAGNOSIS — I129 Hypertensive chronic kidney disease with stage 1 through stage 4 chronic kidney disease, or unspecified chronic kidney disease: Secondary | ICD-10-CM | POA: Diagnosis not present

## 2021-02-17 DIAGNOSIS — N1831 Chronic kidney disease, stage 3a: Secondary | ICD-10-CM | POA: Diagnosis not present

## 2021-02-17 DIAGNOSIS — N041 Nephrotic syndrome with focal and segmental glomerular lesions: Secondary | ICD-10-CM | POA: Diagnosis not present

## 2021-03-02 DIAGNOSIS — F39 Unspecified mood [affective] disorder: Secondary | ICD-10-CM | POA: Diagnosis not present

## 2021-03-11 ENCOUNTER — Telehealth: Payer: Self-pay | Admitting: Family Medicine

## 2021-03-11 NOTE — Telephone Encounter (Signed)
LVM for patient to call back to reschedule because Amy will be out this day. Also sent mychart message.

## 2021-03-16 ENCOUNTER — Ambulatory Visit: Payer: BC Managed Care – PPO | Admitting: Family Medicine

## 2021-03-16 DIAGNOSIS — F39 Unspecified mood [affective] disorder: Secondary | ICD-10-CM | POA: Diagnosis not present

## 2021-04-01 ENCOUNTER — Other Ambulatory Visit: Payer: Self-pay | Admitting: Cardiology

## 2021-04-03 IMAGING — CR DG CHEST 2V
2 series · 2 of 2 positions shown · non-contrast
Comparison: 11/09/2014

CLINICAL DATA: Leg swelling

EXAM:
CHEST - 2 VIEW

[chest pa]
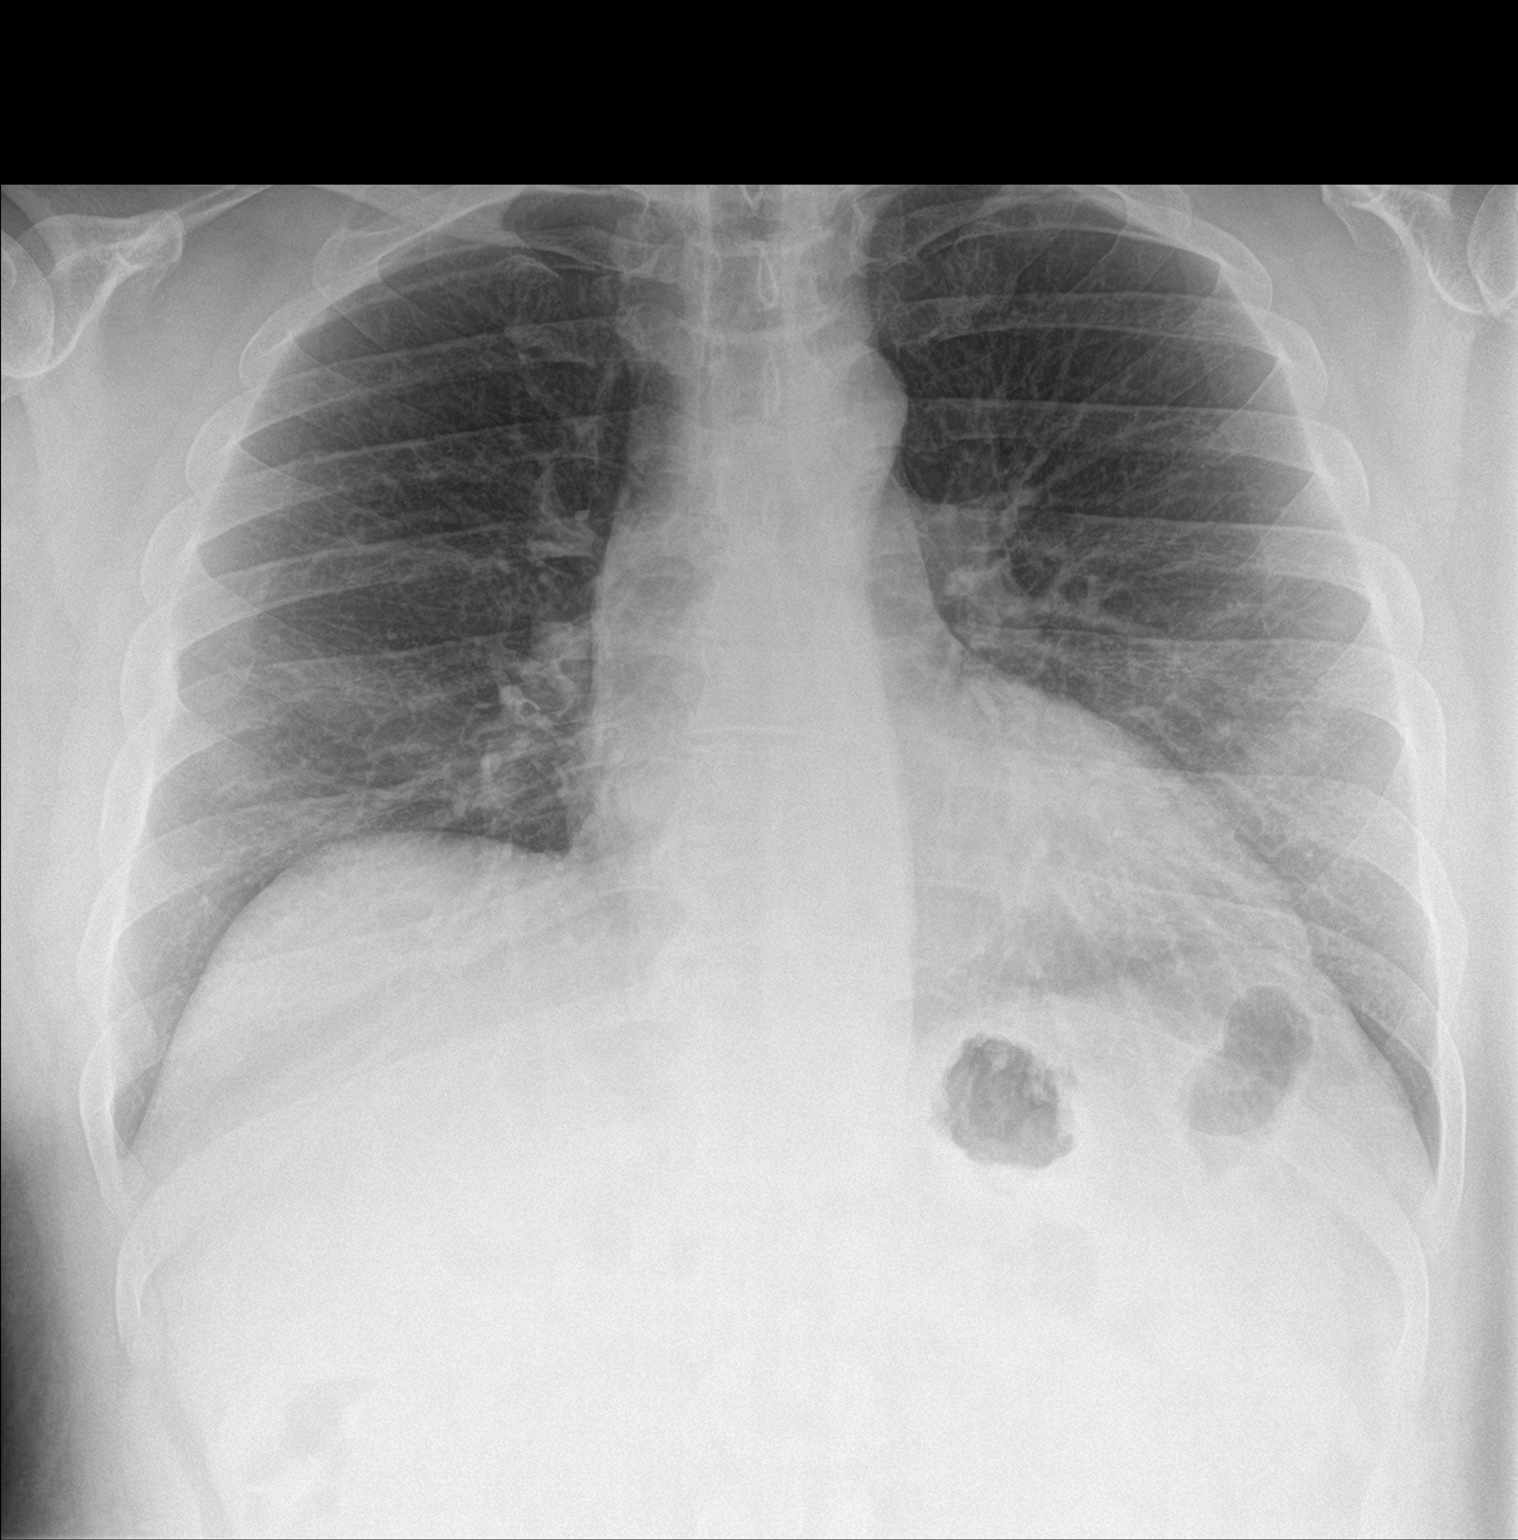

[chest lat]
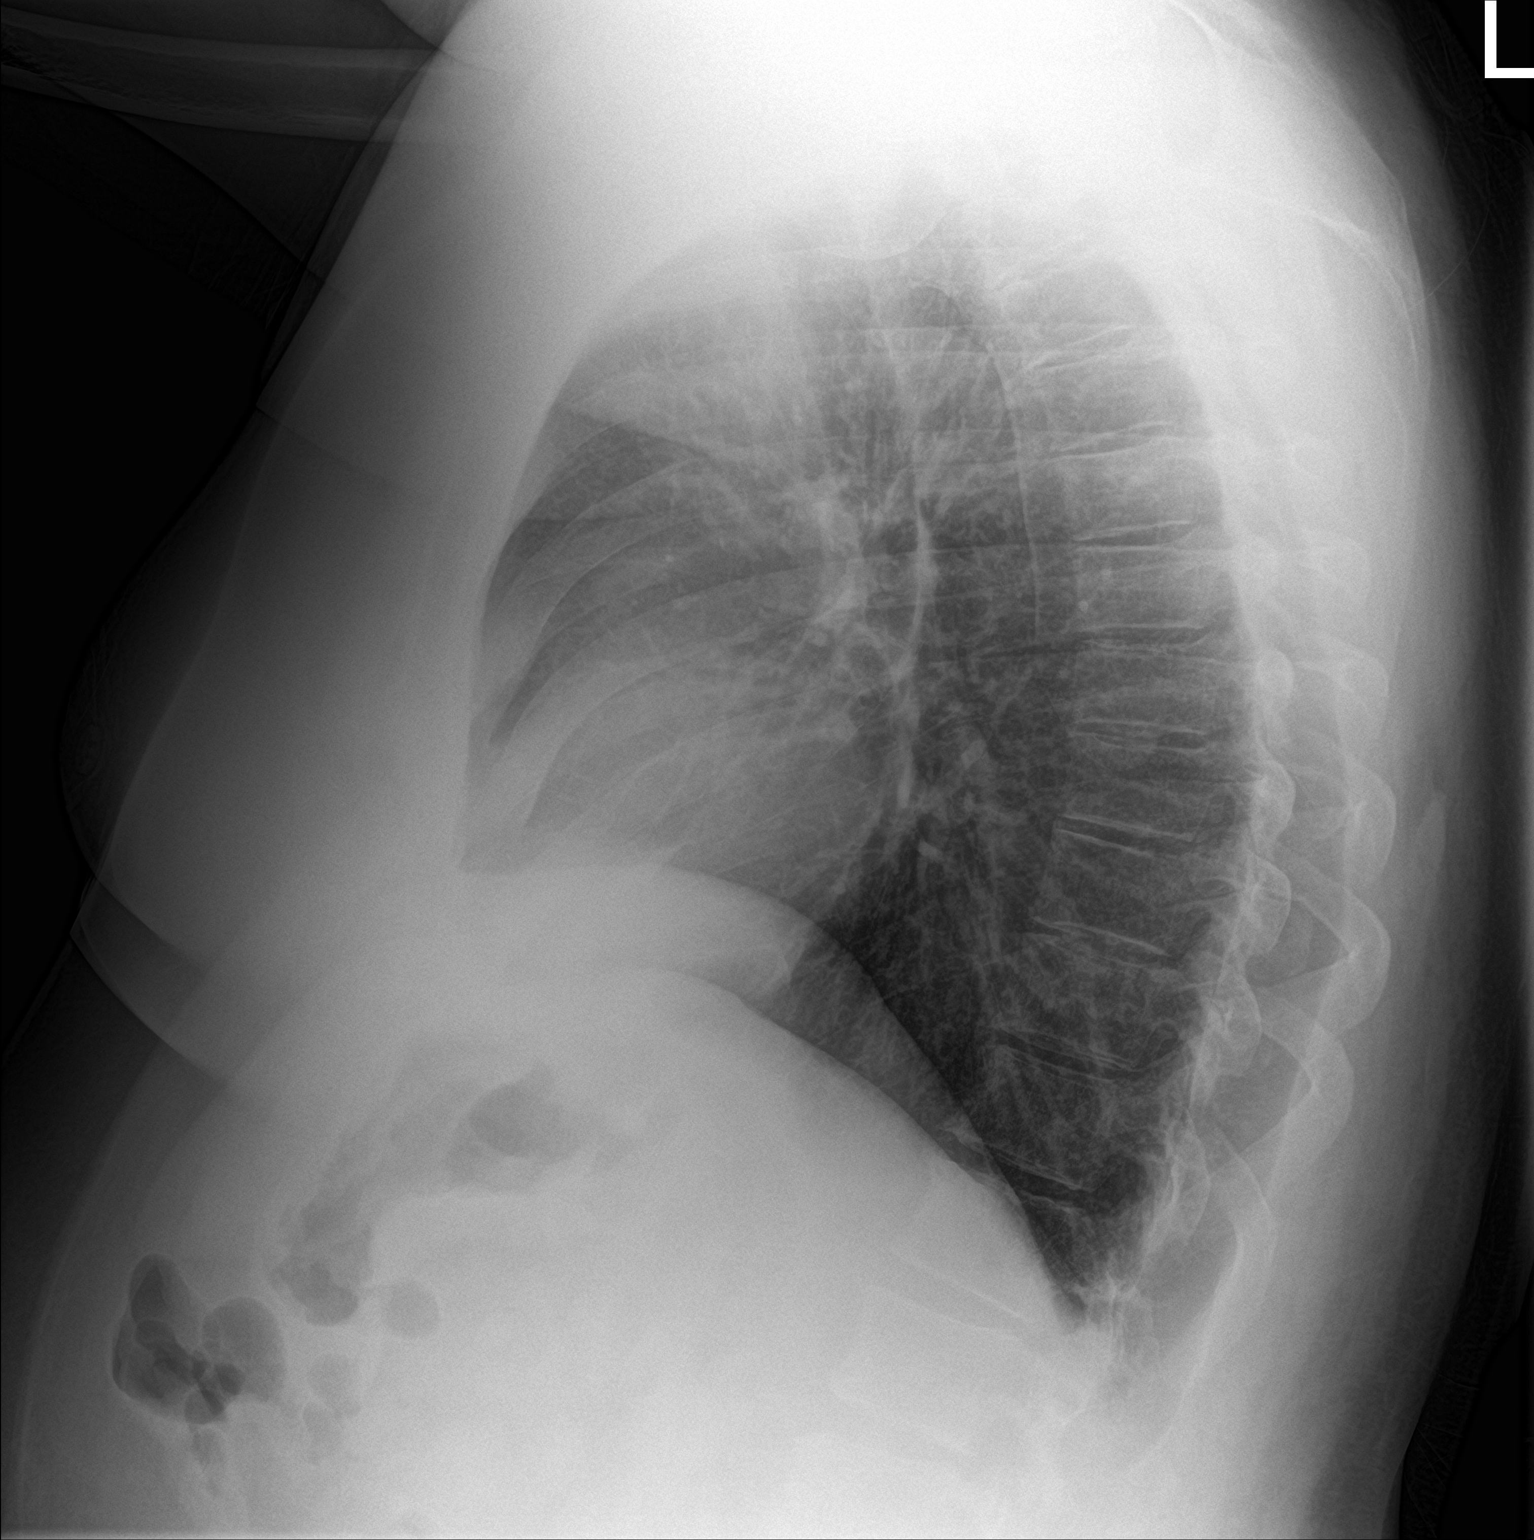

[2 of 2 positions shown; findings below may reference images not displayed]

FINDINGS: The heart size and mediastinal contours are within normal limits.
Both lungs are clear. The visualized skeletal structures are
unremarkable.
IMPRESSION: No acute abnormality of the lungs.

## 2021-04-06 DIAGNOSIS — F39 Unspecified mood [affective] disorder: Secondary | ICD-10-CM | POA: Diagnosis not present

## 2021-04-07 DIAGNOSIS — G4733 Obstructive sleep apnea (adult) (pediatric): Secondary | ICD-10-CM | POA: Diagnosis not present

## 2021-04-13 DIAGNOSIS — G4733 Obstructive sleep apnea (adult) (pediatric): Secondary | ICD-10-CM | POA: Diagnosis not present

## 2021-04-20 DIAGNOSIS — F39 Unspecified mood [affective] disorder: Secondary | ICD-10-CM | POA: Diagnosis not present

## 2021-05-04 DIAGNOSIS — F39 Unspecified mood [affective] disorder: Secondary | ICD-10-CM | POA: Diagnosis not present

## 2021-05-05 ENCOUNTER — Telehealth: Payer: Self-pay | Admitting: Family Medicine

## 2021-05-05 NOTE — Telephone Encounter (Signed)
Medical records emailed to [facility]. 

## 2021-05-07 ENCOUNTER — Ambulatory Visit
Admission: RE | Admit: 2021-05-07 | Discharge: 2021-05-07 | Disposition: A | Discharge status: Home / Self Care | Payer: BC Managed Care – PPO | Source: Ambulatory Visit | Attending: Family Medicine | Admitting: Family Medicine

## 2021-05-07 ENCOUNTER — Other Ambulatory Visit: Payer: Self-pay | Admitting: Family Medicine

## 2021-05-07 DIAGNOSIS — G8929 Other chronic pain: Secondary | ICD-10-CM | POA: Diagnosis not present

## 2021-05-07 DIAGNOSIS — M47812 Spondylosis without myelopathy or radiculopathy, cervical region: Secondary | ICD-10-CM | POA: Diagnosis not present

## 2021-05-07 DIAGNOSIS — G4733 Obstructive sleep apnea (adult) (pediatric): Secondary | ICD-10-CM | POA: Diagnosis not present

## 2021-05-07 DIAGNOSIS — M47814 Spondylosis without myelopathy or radiculopathy, thoracic region: Secondary | ICD-10-CM | POA: Diagnosis not present

## 2021-05-10 DIAGNOSIS — N1831 Chronic kidney disease, stage 3a: Secondary | ICD-10-CM | POA: Diagnosis not present

## 2021-05-18 DIAGNOSIS — F39 Unspecified mood [affective] disorder: Secondary | ICD-10-CM | POA: Diagnosis not present

## 2021-05-20 DIAGNOSIS — N1831 Chronic kidney disease, stage 3a: Secondary | ICD-10-CM | POA: Diagnosis not present

## 2021-05-20 DIAGNOSIS — I129 Hypertensive chronic kidney disease with stage 1 through stage 4 chronic kidney disease, or unspecified chronic kidney disease: Secondary | ICD-10-CM | POA: Diagnosis not present

## 2021-05-20 DIAGNOSIS — N041 Nephrotic syndrome with focal and segmental glomerular lesions: Secondary | ICD-10-CM | POA: Diagnosis not present

## 2021-05-20 DIAGNOSIS — D631 Anemia in chronic kidney disease: Secondary | ICD-10-CM | POA: Diagnosis not present

## 2021-05-31 IMAGING — US US RENAL
1 series · 14 of 25 positions shown · non-contrast
Comparison: None.

CLINICAL DATA: Chronic kidney disease

EXAM:
RENAL / URINARY TRACT ULTRASOUND COMPLETE

[Series 1: us renal · 0.30mm/px · 14 of 28 slices shown]
[im 1/28]
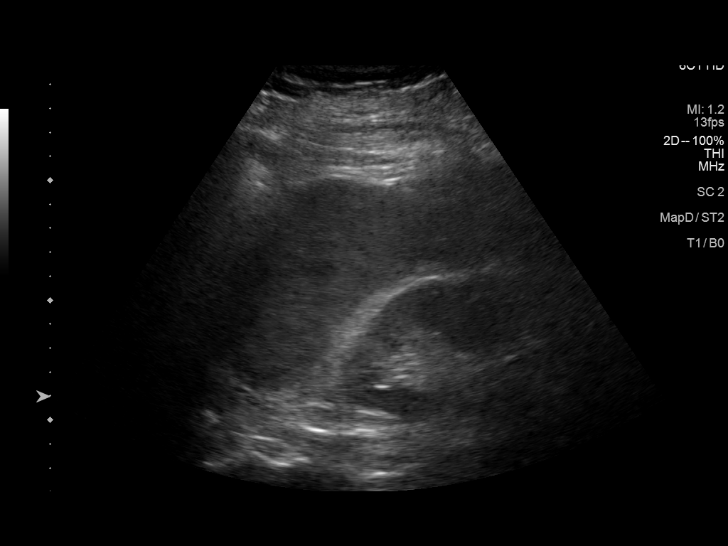
[im 3/28]
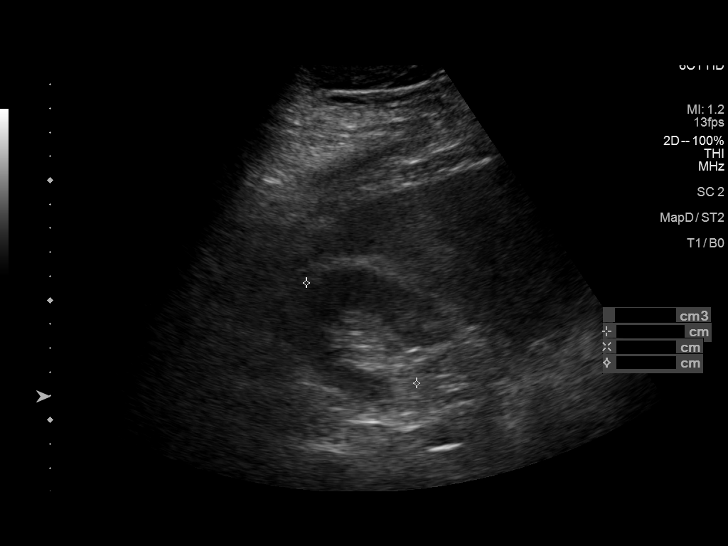
[im 5/28]
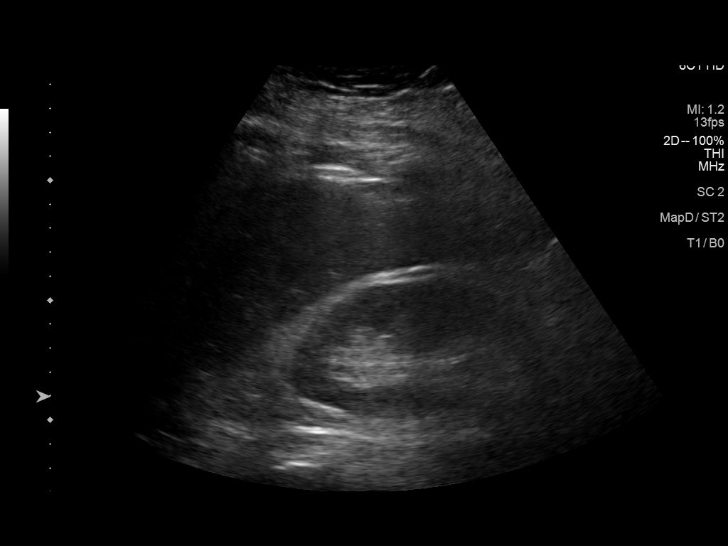
[im 7/28]
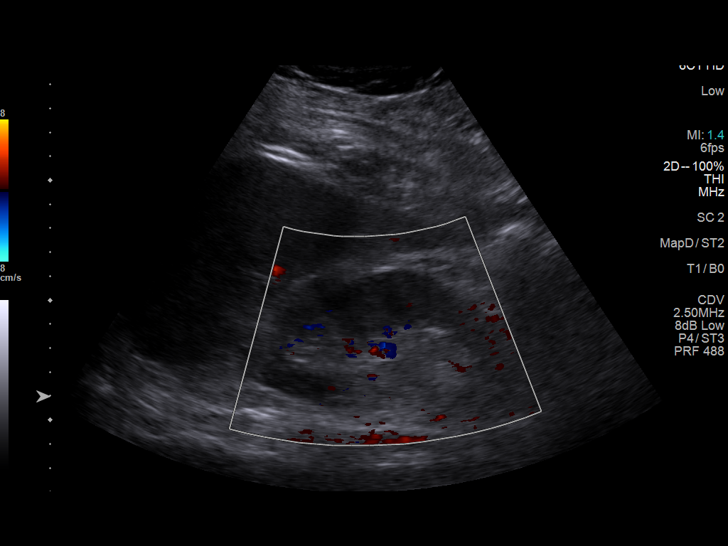
[im 10/28]
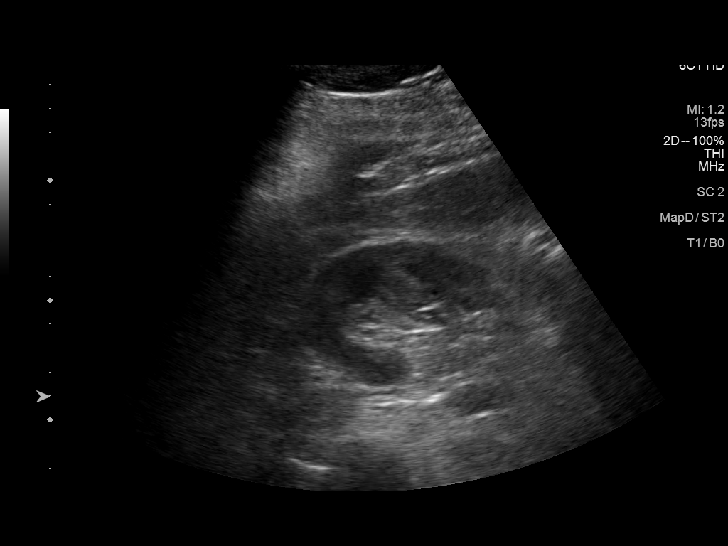
[im 11/28]
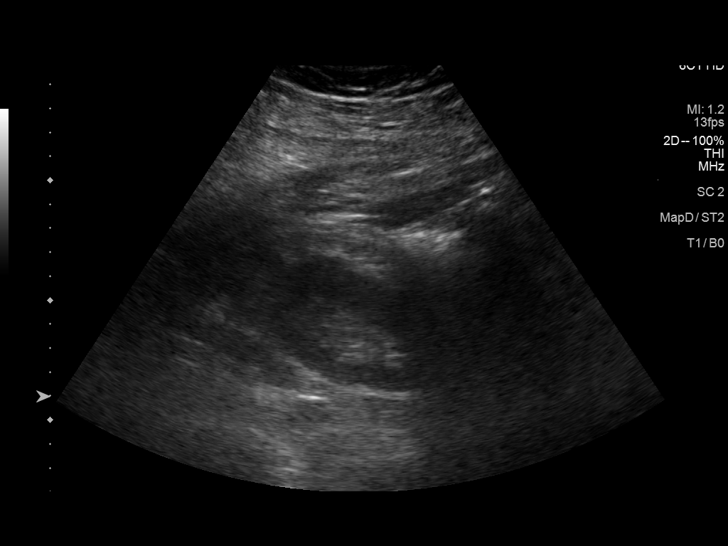
[im 13/28]
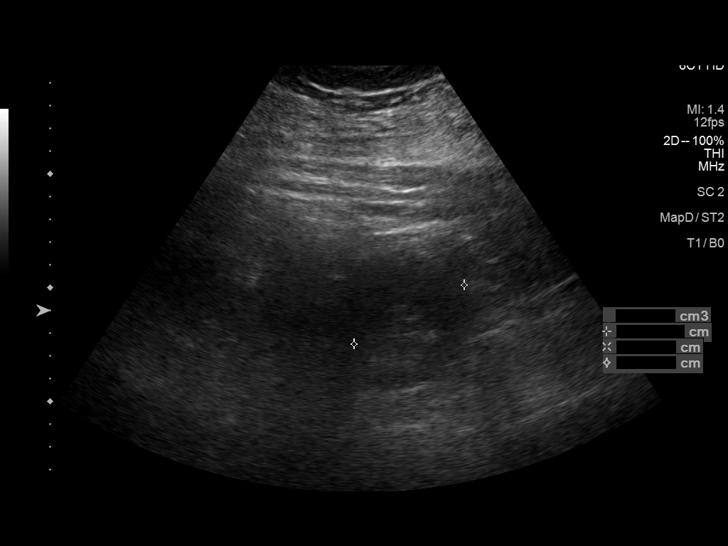
[im 15/28]
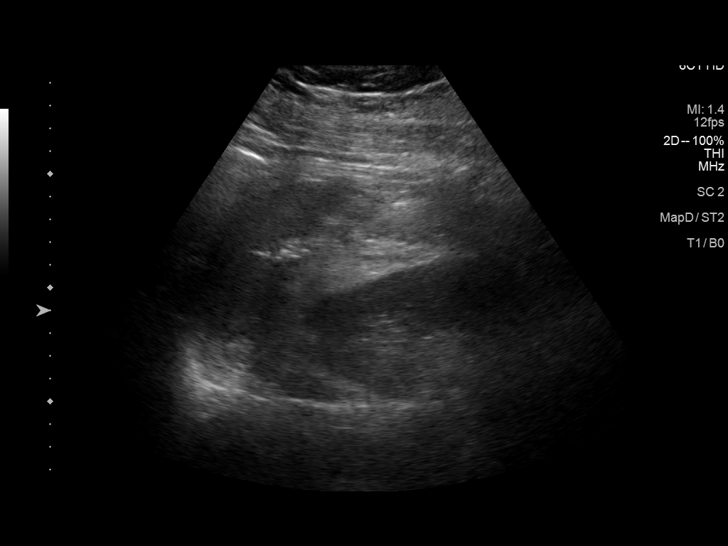
[im 17/28]
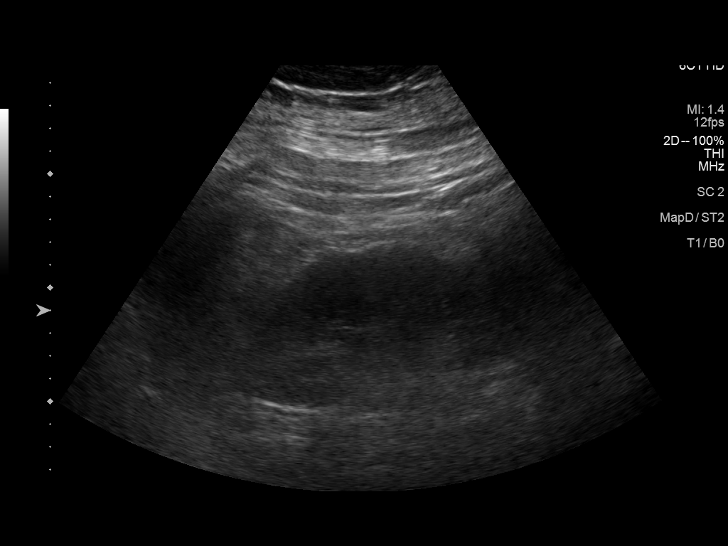
[im 19/28]
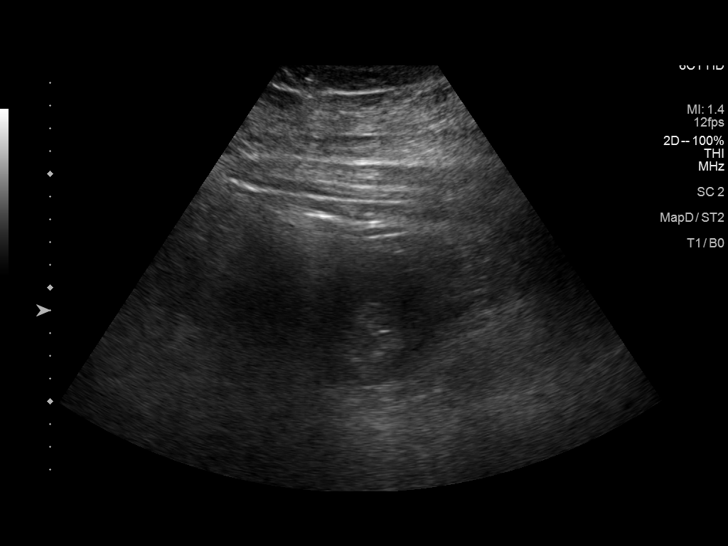
[im 21/28]
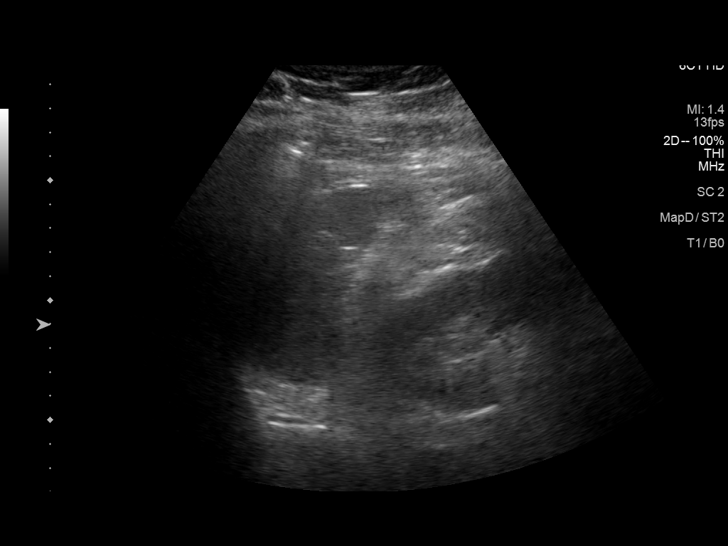
[im 23/28]
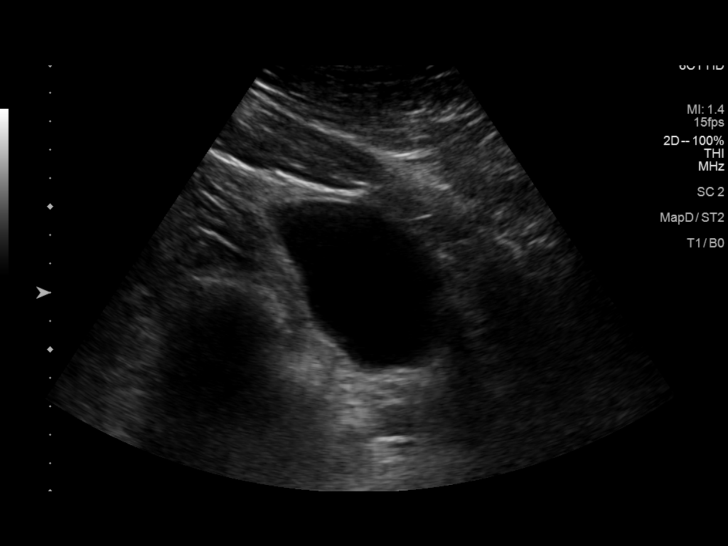
[im 25/28]
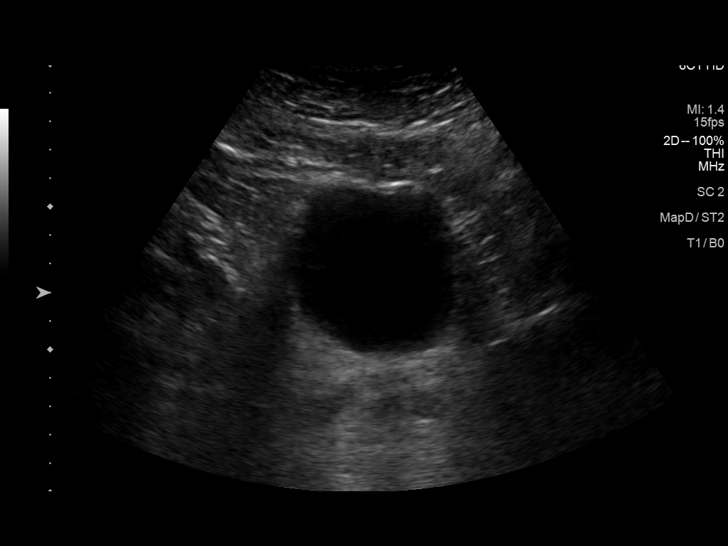
[im 28/28]
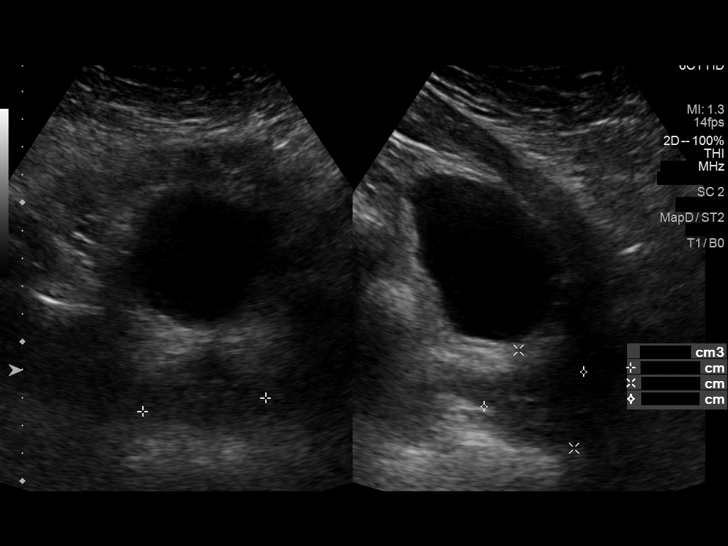

[14 of 25 positions shown; findings below may reference images not displayed]

FINDINGS: Right Kidney:

Renal measurements: 12.5 x 6.1 x 6.2 cm = volume: 247 mL .
Echogenicity within normal limits. No mass or hydronephrosis
visualized.

Left Kidney:

Renal measurements: 13.5 x 6.5 x 5.5 cm = volume: 253 mL.
Echogenicity within normal limits. No mass or hydronephrosis
visualized.

Bladder:

Appears normal for degree of bladder distention. Slightly enlarged
prostate with volume of 36 mL.

Other:

None.
IMPRESSION: Negative renal ultrasound

## 2021-06-01 DIAGNOSIS — F39 Unspecified mood [affective] disorder: Secondary | ICD-10-CM | POA: Diagnosis not present

## 2021-06-07 DIAGNOSIS — G4733 Obstructive sleep apnea (adult) (pediatric): Secondary | ICD-10-CM | POA: Diagnosis not present

## 2021-06-09 DIAGNOSIS — G2569 Other tics of organic origin: Secondary | ICD-10-CM | POA: Diagnosis not present

## 2021-06-09 DIAGNOSIS — F6381 Intermittent explosive disorder: Secondary | ICD-10-CM | POA: Diagnosis not present

## 2021-06-09 DIAGNOSIS — F840 Autistic disorder: Secondary | ICD-10-CM | POA: Diagnosis not present

## 2021-06-15 DIAGNOSIS — F39 Unspecified mood [affective] disorder: Secondary | ICD-10-CM | POA: Diagnosis not present

## 2021-06-29 DIAGNOSIS — F39 Unspecified mood [affective] disorder: Secondary | ICD-10-CM | POA: Diagnosis not present

## 2021-07-13 DIAGNOSIS — F39 Unspecified mood [affective] disorder: Secondary | ICD-10-CM | POA: Diagnosis not present

## 2021-07-14 DIAGNOSIS — N1831 Chronic kidney disease, stage 3a: Secondary | ICD-10-CM | POA: Diagnosis not present

## 2021-07-16 DIAGNOSIS — M1A00X0 Idiopathic chronic gout, unspecified site, without tophus (tophi): Secondary | ICD-10-CM | POA: Diagnosis not present

## 2021-07-22 NOTE — Patient Instructions (Addendum)
Please continue using your CPAP regularly. While your insurance requires that you use CPAP at least 4 hours each night on 70% of the nights, I recommend, that you not skip any nights and use it throughout the night if you can. Getting used to CPAP and staying with the treatment long term does take time and patience and discipline. Untreated obstructive sleep apnea when it is moderate to severe can have an adverse impact on cardiovascular health and raise her risk for heart disease, arrhythmias, hypertension, congestive heart failure, stroke and diabetes. Untreated obstructive sleep apnea causes sleep disruption, nonrestorative sleep, and sleep deprivation. This can have an impact on your day to day functioning and cause daytime sleepiness and impairment of cognitive function, memory loss, mood disturbance, and problems focussing. Using CPAP regularly can improve these symptoms.   Please continue to monitor leak at home. If you wish to try a different mask, please let DME know. Monitor apneic events at home. If events continue to increase, we could consider titration study.   I will recheck download in 2-3 months to make sure apneic events are not worsening.   Follow up in 1 year

## 2021-07-22 NOTE — Progress Notes (Signed)
PATIENT: Darren Hamilton DOB: July 29, 1960  REASON FOR VISIT: follow up HISTORY FROM: patient  Chief Complaint  Patient presents with   Obstructive Sleep Apnea    Rm 10, alone. Here for yearly CPAP fu. Pt reports doing well. Pt was having issues with mask and straps, pt went to [facility] and is now doing better.      HISTORY OF PRESENT ILLNESS: 07/26/21 ALL:  Callin returns for follow up for OSA on CPAP. He is doing well on therapy. He is using his machine every night. He does continue to note increased leak. He has tried numerous mask refitting but does not feel it helped. He is most comfortable with FFM. He wakes feeling refreshed. He feels that he sleeps well. He denies concerns with CPAP or supplies.     03/10/2020 ALL:  Darren Hamilton is a 61 y.o. male here today for follow up for OSA on CPAP. He reports that he has done very well with CPAP therapy over the past 6 months.  He notes significant improvement in sleep quality as well as daytime energy levels with consistent usage of CPAP.  He did try using a chinstrap but states that this did not work out well for him.  He does not feel that mouth breathing continues to be a concern.  He does have a significant amount of facial hair.  He feels that his foam FFM slips off the top of his nose.  He was previously using a silicone fullface mask and is curious if that may work better for him.  Otherwise, he is doing very well.  Compliance report dated 02/08/2020 through 03/08/2020 reveals that he used CPAP 30 of the past 30 days for compliance of 100%.  He used CPAP greater than 4 hours all 30 days.  Average usage was 7 hours and 34 minutes.  Residual AHI was elevated at 6.0 on 7 to 16 cm of water pressure and an EPR of 3.  A significant leak remains in the 95th percentile of 48.5 L/min.  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  The patient is unaccompanied today. I saw him on 06/11/2019, at which time he was fully compliant with his AutoPap, he felt like he was sleeping better.  He had an interim issue with his kidneys, was diagnosed with focal glomerulosclerosis.  He had some issues with the nasal mass causing almost like an acne-like outbreak around the nose.  He had lost weight.  I suggested we proceed with an overnight pulse oximetry test to make sure his oxygen saturations were adequate while in was on his AutoPap therapy.  He had a overnight pulse oximetry test on 06/14/2019 which showed an average oxygen saturation of 94%, nadir was 84%, time below or at 88% saturation while on room air on AutoPap was 43 seconds.  We attempted to call him with his test results.  He was advised to continue with AutoPap therapy.   Today, 09/09/2019: I reviewed his AutoPap compliance data from 08/10/2019 through 09/08/2019, which is a total of 30 days, during which time he used his machine every night with percent use days greater than 4 hours at 100%, indicating superb compliance with an average usage of 6 hours and 55 minutes, residual AHI borderline at  4.9/h, average pressure for the 95th percentile at 15.8 cm, leak high with a 95th percentile at 53.1 L/min on a pressure range of 7 cm to 16 cm with EPR.  We also reviewed his overnight pulse oximetry test results from 06/14/2019.  He has been compliant with his CPAP, continues to benefit from it but reports significant weight fluctuations, particularly with fluid weight.  He is currently on prednisone.  He was treated with a diuretic recently, follows closely with nephrology.  He keeps his beard trimmed, the full facemask is still comfortable enough with the memory foam and he is mindful about changing his supplies on a timely basis but has  noticed air leaking from the mouth, he has a tendency to have his mouth open at night.  He wants to continue to work on weight loss but it is frustrating for him because of exercise intolerance, feeling fatigued, and fluid retention.  He is motivated to continue with his AutoPap.   The patient's allergies, current medications, family history, past medical history, past social history, past surgical history and problem list were reviewed and updated as appropriate.    Previously:    I first met him on 02/05/2019 at the request of his primary care physician, at which time the patient reported snoring and excessive daytime somnolence as well as witnessed apneas.  He was advised to proceed with sleep study testing.  He had a home sleep test on 02/20/2019 which showed severe obstructive sleep apnea with a total AHI of 86.4/hour and O2 nadir of 67%.  Since his insurance did not approve a laboratory attended sleep study, he was advised to proceed with AutoPap therapy at home.   I reviewed his AutoPap compliance data from 05/11/2019 through 06/09/2019 which is a total of 30 days, during which time he used his machine every night with percent use days greater than 4 hours at 100%, indicating superb compliance with an average usage of 7 hours and 42 minutes, residual AHI borderline at 4.6/h, 95th percentile of pressure at 14.8 cm with a range of 7 cm to 15 cm with EPR of 3, leak on the higher end with a 95th percentile at 21.8 L/min.      02/05/2019: (He) reports snoring, excessive daytime sleepiness and witnessed apneas per wife's report.  I reviewed your office note from 12/25/2018. His Epworth sleepiness score is 19 out of 24, fatigue severity score is 54 out of 63.  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  His bedtime is generally around 11 but sometimes later than that.  His rise time is generally around 7 AM.  He lives with his wife and 19 year old son who has autism.  He has a TV in the bedroom but they  hardly use it.  They have 2 cats in the household who do not bother them at night.  He has recently noticed lower extremity edema.  He has been referred for an echocardiogram.  He had a stress test several years ago which was benign per his report.  He has gained over the past several years.  He has rare morning headaches, nocturia about once per average night, admits to drinking quite a bit of caffeine in the form of coffee, about a 8 cup pot per day and 1 serving of tea.  He does not have any telltale symptoms of restless leg syndrome but is a restless sleeper.  His wife has commented on his loud snoring which is worse when he is on  his back.  He tries to sleep on his sides.  He is not aware of any family history of sleep apnea but suspects that his mom may have had it, she was a loud snorer, she passed away recently at age 30, father passed away from complications of heart disease at age 71.   REVIEW OF SYSTEMS: Out of a complete 14 system review of symptoms, the patient complains only of the following symptoms, fatigue and all other reviewed systems are negative.  ESS: 4  ALLERGIES: No Known Allergies  HOME MEDICATIONS: Outpatient Medications Prior to Visit  Medication Sig Dispense Refill   acetaminophen (TYLENOL) 500 MG tablet Take 500-1,000 mg by mouth every 6 (six) hours as needed for mild pain or moderate pain.     albuterol (VENTOLIN HFA) 108 (90 Base) MCG/ACT inhaler TAKE 2 PUFFS BY MOUTH EVERY 6 HOURS AS NEEDED FOR WHEEZE OR SHORTNESS OF BREATH 8.5 each 11   cetirizine (ZYRTEC) 10 MG tablet Take 10 mg by mouth daily.     Cholecalciferol (DIALYVITE VITAMIN D 5000 PO) Take 5,000 Units by mouth daily.      colchicine 0.6 MG tablet Take 1 tablet (0.6 mg total) by mouth daily. 30 tablet 0   escitalopram (LEXAPRO) 20 MG tablet TAKE 1 TABLET (20 MG TOTAL) BY MOUTH DAILY. START 1/2 TAB DAILY, MAY INCREASE TO 1 DAILY AFTER 1 WEEK 90 tablet 3   FARXIGA 10 MG TABS tablet Take 10 mg by mouth daily.      gemfibrozil (LOPID) 600 MG tablet Take 1 tablet (600 mg total) by mouth 2 (two) times daily with a meal. 180 tablet 3   lisinopril (ZESTRIL) 10 MG tablet TAKE 1 TABLET BY MOUTH EVERY DAY 90 tablet 3   pegloticase (KRYSTEXXA) 8 MG/ML injection Inject into the vein every 14 (fourteen) days.     rosuvastatin (CRESTOR) 20 MG tablet TAKE 1 TABLET BY MOUTH EVERY DAY 90 tablet 1   tacrolimus (PROGRAF) 1 MG capsule Take 2 mg by mouth 2 (two) times daily.     torsemide (DEMADEX) 100 MG tablet Take 100 mg by mouth daily.     allopurinol (ZYLOPRIM) 100 MG tablet Take 100 mg by mouth 2 (two) times daily.     No facility-administered medications prior to visit.    PAST MEDICAL HISTORY: Past Medical History:  Diagnosis Date   Anxiety    Asthma    Depression    Hypertension    Nephrotic syndrome     PAST SURGICAL HISTORY: Past Surgical History:  Procedure Laterality Date   RENAL BIOPSY      FAMILY HISTORY: Family History  Problem Relation Age of Onset   Heart failure Mother    Heart failure Father    Celiac disease Sister    Diabetes Maternal Grandmother    Heart disease Maternal Grandmother    Kidney failure Maternal Grandfather    Lung cancer Paternal Grandmother    Cancer Paternal Grandmother    Heart disease Paternal Grandfather    Celiac disease Other    Prostate cancer Neg Hx    Colon cancer Neg Hx     SOCIAL HISTORY: Social History   Socioeconomic History   Marital status: Single    Spouse name: Not on file   Number of children: Not on file   Years of education: Not on file   Highest education level: Not on file  Occupational History   Not on file  Tobacco Use   Smoking status: Never  Smokeless tobacco: Never  Vaping Use   Vaping Use: Never used  Substance and Sexual Activity   Alcohol use: Yes    Alcohol/week: 3.0 standard drinks    Types: 1 Glasses of wine, 1 Cans of beer, 1 Shots of liquor per week    Comment: occasionally   Drug use: No   Sexual  activity: Not on file  Other Topics Concern   Not on file  Social History Narrative   Not on file   Social Determinants of Health   Financial Resource Strain: Not on file  Food Insecurity: Not on file  Transportation Needs: Not on file  Physical Activity: Not on file  Stress: Not on file  Social Connections: Not on file  Intimate Partner Violence: Not on file      PHYSICAL EXAM  Vitals:   07/26/21 0802  BP: (!) 140/91  Pulse: 85  Weight: 273 lb (123.8 kg)  Height: 5' 8"  (1.727 m)    Body mass index is 41.51 kg/m.  Generalized: Well developed, in no acute distress  Cardiology: normal rate and rhythm, no murmur noted Respiratory: clear to auscultation bilaterally  Neurological examination  Mentation: Alert oriented to time, place, history taking. Follows all commands speech and language fluent Cranial nerve II-XII: Pupils were equal round reactive to light. Extraocular movements were full, visual field were full  Motor: The motor testing reveals 5 over 5 strength of all 4 extremities. Good symmetric motor tone is noted throughout.  Gait and station: Gait is wide but stable    DIAGNOSTIC DATA (LABS, IMAGING, TESTING) - I reviewed patient records, labs, notes, testing and imaging myself where available.  No flowsheet data found.   Lab Results  Component Value Date   WBC 11.4 (H) 11/16/2019   HGB 11.0 (L) 11/16/2019   HCT 33.5 (L) 11/16/2019   MCV 93.3 11/16/2019   PLT 412 (H) 11/16/2019      Component Value Date/Time   NA 139 11/19/2019 0904   NA 138 04/09/2019 0822   K 3.9 11/19/2019 0904   CL 100 11/19/2019 0904   CO2 29 11/19/2019 0904   GLUCOSE 105 (H) 11/19/2019 0904   BUN 49 (H) 11/19/2019 0904   BUN 20 04/09/2019 0822   CREATININE 1.75 (H) 11/19/2019 0904   CREATININE 1.05 12/25/2018 1700   CALCIUM 9.0 11/19/2019 0904   PROT 5.9 (L) 11/08/2019 0503   PROT 4.3 (LL) 04/01/2019 1246   ALBUMIN 2.1 (L) 11/19/2019 0904   ALBUMIN 2.0 (L) 04/01/2019  1246   AST 13 (L) 11/08/2019 0503   ALT 14 11/08/2019 0503   ALKPHOS 49 11/08/2019 0503   BILITOT 0.6 11/08/2019 0503   BILITOT <0.2 04/01/2019 1246   GFRNONAA 43 (L) 11/19/2019 0904   GFRAA 49 (L) 11/19/2019 0904   Lab Results  Component Value Date   CHOL 404 (H) 07/06/2020   HDL 45 07/06/2020   LDLCALC 213 (H) 07/06/2020   TRIG 642 (HH) 07/06/2020   CHOLHDL 9.0 (H) 07/06/2020   Lab Results  Component Value Date   HGBA1C 6.0 (H) 03/31/2020   No results found for: [id number] Lab Results  Component Value Date   TSH 4.64 (H) 03/31/2020       ASSESSMENT AND PLAN 61 y.o. year old male  has a past medical history of Anxiety, Asthma, Depression, Hypertension, and Nephrotic syndrome. here with     ICD-10-CM   1. OSA on CPAP  G47.33 For home use only DME continuous positive airway pressure (CPAP)  Z99.89         Jonni Sanger is doing well with CPAP compliance.  Compliance report reveals daily and 4-hour compliance of 100%.  I have commended him on his excellent compliance and encouraged him to continue using CPAP nightly and for greater than 4 hours each night.  Residual AHI is 6 events per hour. Baseline AHI 86.4/hr. I suspect that his facial hair is most likely contributing to leak. He has tried multiple mask and feels that current FFM works best for him. He will continue to monitor for leak and for worsening apneic events at home. I will repeat CPAP download in 8-12 to assess response and AHI following pressure changes. May consider titration  study in the future if needed. Healthy lifestyle habits encouraged.  He will follow-up with [place] in 1 year, sooner if needed.  He verbalizes understanding and agreement with this plan.   Orders Placed This Encounter  Procedures   For home use only DME continuous positive airway pressure (CPAP)    Supplies    Order Specific Question:   Length of Need    Answer:   Lifetime    Order Specific Question:   Patient has OSA or probable OSA     Answer:   Yes    Order Specific Question:   Is the patient currently using CPAP in the home    Answer:   Yes    Order Specific Question:   Settings    Answer:   Other see comments    Order Specific Question:   CPAP supplies needed    Answer:   Mask, headgear, cushions, filters, heated tubing and water chamber     No orders of the defined types were placed in this encounter.    Debbora Presto, FNP-C 07/26/2021, 10:31 AM [facility] [address] [place], [place] [address] [phone]

## 2021-07-26 ENCOUNTER — Ambulatory Visit: Payer: BC Managed Care – PPO | Admitting: Family Medicine

## 2021-07-26 ENCOUNTER — Encounter: Payer: Self-pay | Admitting: Family Medicine

## 2021-07-26 VITALS — BP 140/91 | HR 85 | Ht 68.0 in | Wt 273.0 lb

## 2021-07-26 DIAGNOSIS — G4733 Obstructive sleep apnea (adult) (pediatric): Secondary | ICD-10-CM

## 2021-07-26 DIAGNOSIS — Z9989 Dependence on other enabling machines and devices: Secondary | ICD-10-CM | POA: Diagnosis not present

## 2021-07-26 NOTE — Progress Notes (Signed)
CM sent to [facility] for new order ?

## 2021-07-27 DIAGNOSIS — G4733 Obstructive sleep apnea (adult) (pediatric): Secondary | ICD-10-CM | POA: Diagnosis not present

## 2021-07-29 DIAGNOSIS — F39 Unspecified mood [affective] disorder: Secondary | ICD-10-CM | POA: Diagnosis not present

## 2021-07-29 DIAGNOSIS — M1A00X0 Idiopathic chronic gout, unspecified site, without tophus (tophi): Secondary | ICD-10-CM | POA: Diagnosis not present

## 2021-08-11 DIAGNOSIS — N1831 Chronic kidney disease, stage 3a: Secondary | ICD-10-CM | POA: Diagnosis not present

## 2021-08-13 DIAGNOSIS — M1A00X0 Idiopathic chronic gout, unspecified site, without tophus (tophi): Secondary | ICD-10-CM | POA: Diagnosis not present

## 2021-08-19 DIAGNOSIS — F39 Unspecified mood [affective] disorder: Secondary | ICD-10-CM | POA: Diagnosis not present

## 2021-08-24 DIAGNOSIS — G4733 Obstructive sleep apnea (adult) (pediatric): Secondary | ICD-10-CM | POA: Diagnosis not present

## 2021-08-25 DIAGNOSIS — N1831 Chronic kidney disease, stage 3a: Secondary | ICD-10-CM | POA: Diagnosis not present

## 2021-08-25 DIAGNOSIS — M109 Gout, unspecified: Secondary | ICD-10-CM | POA: Diagnosis not present

## 2021-08-25 DIAGNOSIS — N041 Nephrotic syndrome with focal and segmental glomerular lesions: Secondary | ICD-10-CM | POA: Diagnosis not present

## 2021-08-25 DIAGNOSIS — I129 Hypertensive chronic kidney disease with stage 1 through stage 4 chronic kidney disease, or unspecified chronic kidney disease: Secondary | ICD-10-CM | POA: Diagnosis not present

## 2021-08-27 DIAGNOSIS — M1A00X0 Idiopathic chronic gout, unspecified site, without tophus (tophi): Secondary | ICD-10-CM | POA: Diagnosis not present

## 2021-09-02 DIAGNOSIS — F39 Unspecified mood [affective] disorder: Secondary | ICD-10-CM | POA: Diagnosis not present

## 2021-09-06 NOTE — Progress Notes (Signed)
?Cardiology Office Note:   ? ?Date:  09/07/2021  ? ?ID:  Darren Hamilton, DOB 12-26-1960, MRN [id number] ? ?PCP:  Eunice Blase, MD  ?Cardiologist:  None  ?Electrophysiologist:  None  ? ?Referring MD: Eunice Blase, MD  ? ?No chief complaint on file. ? ? ?History of Present Illness:   ? ?Darren Hamilton is a 61 y.o. male with a hx of hypertension, hyperlipidemia, OSA, diastolic dysfunction, nephrotic syndrome who presents for follow-up.  Patient presented to the [facility] ED on 02/24/2019 with complaint of leg swelling.  Lower extremity duplex showed no evidence of DVT.  BNP was over 1100.  He was started on Lasix 20 mg daily and discharged from the ED.  He did not respond to Lasix and this was changed to torsemide 20 mg BID as an outpatient.  TTE on 02/25/2019 showed normal LV systolic function, grade 1 diastolic dysfunction, mild LVH, normal RV size and function, no significant valvular disease, small/collapsible IVC.   He was referred to cardiology for initial visit on 04/01/2019.  Given his presentation with only mild diastolic dysfunction with significant volume overloaded, labs were checked and showed significant reduction in albumin (2.0) with 4+ proteinuria on urinalysis.  Spot protein to creatinine ratio showed nephrotic range proteinuria.  He was referred to nephrology for further evaluation.  Underwent renal biopsy, which showed FSGS.   ? ?Since last clinic visit, he reports that he has been doing well.  Has been taking tacrolimus for his FSGS, albumin has normalized.  Continues to take torsemide 100 mg daily.  States that he has been euvolemic on this dose.  Denies any chest pain, dyspnea, lower extremity edema, or palpitations.  Reports sometimes feels lightheaded if not keeping up with his fluids while taking torsemide. ? ? ?Wt Readings from Last 3 Encounters:  ?09/07/21 268 lb 9.6 oz (121.8 kg)  ?07/26/21 273 lb (123.8 kg)  ?06/02/20 270 lb 9.6 oz (122.7 kg)  ? ? ? ?Past Medical History:  ?Diagnosis Date   ? Anxiety   ? Asthma   ? Depression   ? Hypertension   ? Nephrotic syndrome   ? ? ?Past Surgical History:  ?Procedure Laterality Date  ? RENAL BIOPSY    ? ? ?Current Medications: ?Current Meds  ?Medication Sig  ? acetaminophen (TYLENOL) 500 MG tablet Take 500-1,000 mg by mouth every 6 (six) hours as needed for mild pain or moderate pain.  ? albuterol (VENTOLIN HFA) 108 (90 Base) MCG/ACT inhaler TAKE 2 PUFFS BY MOUTH EVERY 6 HOURS AS NEEDED FOR WHEEZE OR SHORTNESS OF BREATH  ? Cholecalciferol (DIALYVITE VITAMIN D 5000 PO) Take 5,000 Units by mouth daily.   ? colchicine 0.6 MG tablet Take 1 tablet (0.6 mg total) by mouth daily.  ? escitalopram (LEXAPRO) 20 MG tablet TAKE 1 TABLET (20 MG TOTAL) BY MOUTH DAILY. START 1/2 TAB DAILY, MAY INCREASE TO 1 DAILY AFTER 1 WEEK  ? FARXIGA 10 MG TABS tablet Take 10 mg by mouth daily.  ? gemfibrozil (LOPID) 600 MG tablet Take 1 tablet (600 mg total) by mouth 2 (two) times daily with a meal.  ? levocetirizine (XYZAL) 5 MG tablet daily.  ? lisinopril (ZESTRIL) 10 MG tablet TAKE 1 TABLET BY MOUTH EVERY DAY  ? pegloticase (KRYSTEXXA) 8 MG/ML injection Inject into the vein every 14 (fourteen) days.  ? QVAR REDIHALER 80 MCG/ACT inhaler 1 puff 2 (two) times daily.  ? rosuvastatin (CRESTOR) 20 MG tablet TAKE 1 TABLET BY MOUTH EVERY DAY  ?  tacrolimus (PROGRAF) 1 MG capsule Take 2 mg by mouth 2 (two) times daily. 2mg  morning 1mg  at night  ? torsemide (DEMADEX) 100 MG tablet Take 100 mg by mouth daily.  ?  ? ?Allergies:   Patient has no known allergies.  ? ?Social History  ? ?Socioeconomic History  ? Marital status: Married  ?  Spouse name: Not on file  ? Number of children: Not on file  ? Years of education: Not on file  ? Highest education level: Not on file  ?Occupational History  ? Not on file  ?Tobacco Use  ? Smoking status: Never  ? Smokeless tobacco: Never  ?Vaping Use  ? Vaping Use: Never used  ?Substance and Sexual Activity  ? Alcohol use: Yes  ?  Alcohol/week: 3.0 standard drinks   ?  Types: 1 Glasses of wine, 1 Cans of beer, 1 Shots of liquor per week  ?  Comment: occasionally  ? Drug use: No  ? Sexual activity: Not on file  ?Other Topics Concern  ? Not on file  ?Social History Narrative  ? Not on file  ? ?Social Determinants of Health  ? ?Financial Resource Strain: Not on file  ?Food Insecurity: Not on file  ?Transportation Needs: Not on file  ?Physical Activity: Not on file  ?Stress: Not on file  ?Social Connections: Not on file  ?  ? ?Family History: ?Father was diagnosed with CAD in early 54s, underwent CABG and AVR.   ? ?ROS:   ?Please see the history of present illness.    ? All other systems reviewed and are negative. ? ?EKGs/Labs/Other Studies Reviewed:   ? ?The following studies were reviewed today: ? ? ?EKG:   ?09/07/2021 normal sinus rhythm, rate 72, Q waves in leads III, aVF, poor R wave progression, unchanged from prior ? ?TTE 02/25/19: ?1. Left ventricular ejection fraction, by visual estimation, is 55 to 60%. The left ventricle has normal function. There is mildly increased left ventricular hypertrophy. ? 2. The mitral valve is normal in structure. No evidence of mitral valve regurgitation. No evidence of mitral stenosis. ? 3. The aortic valve is tricuspid Aortic valve regurgitation was not visualized by color flow Doppler. Mild aortic valve sclerosis without stenosis. ? 4. There is mild dilatation of the ascending aorta measuring 38 mm. ? 5. Global right ventricle has normal systolic function.The right ventricular size is normal. No increase in right ventricular wall thickness. ? 6. Left atrial size was normal. ? 7. Right atrial size was normal. ? 8. Left ventricular diastolic Doppler parameters are consistent with impaired relaxation pattern of LV diastolic filling. ? 9. The inferior vena cava is normal in size with greater than 50% respiratory variability, suggesting right atrial pressure of 3 mmHg. ?10. The tricuspid valve is normal in structure. Tricuspid valve regurgitation  was not visualized by color flow Doppler. ? ?Recent Labs: ?No results found for requested labs within last 8760 hours.  ?Recent Lipid Panel ?   ?Component Value Date/Time  ? CHOL 404 (H) 07/06/2020 0835  ? TRIG 642 ([place]) 07/06/2020 0835  ? HDL 45 07/06/2020 0835  ? CHOLHDL 9.0 (H) 07/06/2020 0835  ? CHOLHDL 9.5 (H) 03/31/2020 1342  ? [place] 213 (H) 07/06/2020 [id number]  ? [place]  03/31/2020 1342  ?   Comment:  ?   . ?LDL cholesterol not calculated. Triglyceride levels ?greater than 400 mg/dL invalidate calculated LDL results. ?. ?Reference range: <100 ?Marland Kitchen ?Desirable range <100 mg/dL for primary prevention;   ?<70 mg/dL  for patients with CHD or diabetic patients  ?with > or = 2 CHD risk factors. ?. ?LDL-C is now calculated using the Martin-Hopkins  ?calculation, which is a validated novel method providing  ?better accuracy than the Friedewald equation in the  ?estimation of LDL-C.  ?Cresenciano Genre et al. Annamaria Helling. [id number]): 2061-2068  ?(http://education.[facility].com/faq/FAQ164) ?  ? ? ?Physical Exam:   ? ?VS:  BP 114/80   Pulse 72   Ht 5\' 8"  (1.727 m)   Wt 268 lb 9.6 oz (121.8 kg)   SpO2 94%   BMI 40.84 kg/m?    ? ?Wt Readings from Last 3 Encounters:  ?09/07/21 268 lb 9.6 oz (121.8 kg)  ?07/26/21 273 lb (123.8 kg)  ?06/02/20 270 lb 9.6 oz (122.7 kg)  ?  ? ?GEN:   in no acute distress ?HEENT: Normal ?NECK: No JVD  ?CARDIAC: RRR, no murmurs, rubs, gallops ?RESPIRATORY:  Clear to auscultation without rales, wheezing or rhonchi  ?ABDOMEN: Soft, non-tender, non-distended ?MUSCULOSKELETAL:  No LE edema ?SKIN: Warm and dry ?NEUROLOGIC:  Alert and oriented x 3 ?PSYCHIATRIC:  Normal affect  ? ?ASSESSMENT:   ? ?1. Essential hypertension   ?2. Hyperlipidemia, unspecified hyperlipidemia type   ?3. DOE (dyspnea on exertion)   ?4. Lower extremity edema   ? ? ?PLAN:   ? ? ?LE Edema/dyspnea: Due to nephrotic syndrome, with renal biopsy showing FSGS.  He is following with nephrology and is on tacrolimus.  Currently taking  torsemide 100 mg daily.  Weight has been stable.  Mild diastolic dysfunction on TTE, do not suspect it is contributing much to his presentation.  Continue diuretics per nephrology. ? ?Hypertension: On lisinopril

## 2021-09-07 ENCOUNTER — Encounter: Payer: Self-pay | Admitting: Cardiology

## 2021-09-07 ENCOUNTER — Ambulatory Visit: Payer: BC Managed Care – PPO | Admitting: Cardiology

## 2021-09-07 VITALS — BP 114/80 | HR 72 | Ht 68.0 in | Wt 268.6 lb

## 2021-09-07 DIAGNOSIS — R600 Localized edema: Secondary | ICD-10-CM

## 2021-09-07 DIAGNOSIS — R0609 Other forms of dyspnea: Secondary | ICD-10-CM

## 2021-09-07 DIAGNOSIS — I10 Essential (primary) hypertension: Secondary | ICD-10-CM | POA: Diagnosis not present

## 2021-09-07 DIAGNOSIS — E785 Hyperlipidemia, unspecified: Secondary | ICD-10-CM

## 2021-09-07 LAB — LIPID PANEL
Chol/HDL Ratio: 5.7 ratio — ABNORMAL HIGH (ref 0.0–5.0)
Cholesterol, Total: 204 mg/dL — ABNORMAL HIGH (ref 100–199)
HDL: 36 mg/dL — ABNORMAL LOW (ref >39)
LDL Chol Calc (NIH): 104 mg/dL — ABNORMAL HIGH (ref 0–99)
Triglycerides: 376 mg/dL — ABNORMAL HIGH (ref 0–149)
VLDL Cholesterol Cal: 64 mg/dL — ABNORMAL HIGH (ref 5–40)

## 2021-09-07 NOTE — Patient Instructions (Signed)
Medication Instructions:  ?Your physician recommends that you continue on your current medications as directed. Please refer to the Current Medication list given to you today. ? ?*If you need a refill on your cardiac medications before your next appointment, please call your pharmacy* ? ? ?Lab Work: ?Lipid panel today ? ?If you have labs (blood work) drawn today and your tests are completely normal, you will receive your results only by: ?MyChart Message (if you have MyChart) OR ?A paper copy in the mail ?If you have any lab test that is abnormal or we need to change your treatment, we will call you to review the results. ? ? ?Testing/Procedures: ?CT coronary calcium score.  ? ?Test locations:  ?[facility] ([address] 3rd Floor [place], [place] [address]) ?[facility] ([address] [place], [place] [address])  ? ?This is $99 out of pocket. ? ? ?Coronary CalciumScan ?A coronary calcium scan is an imaging test used to look for deposits of calcium and other fatty materials (plaques) in the inner lining of the blood vessels of the heart (coronary arteries). These deposits of calcium and plaques can partly clog and narrow the coronary arteries without producing any symptoms or warning signs. This puts a person at risk for a heart attack. This test can detect these deposits before symptoms develop. ?Tell a health care provider about: ?Any allergies you have. ?All medicines you are taking, including vitamins, herbs, eye drops, creams, and over-the-counter medicines. ?Any problems you or family members have had with anesthetic medicines. ?Any blood disorders you have. ?Any surgeries you have had. ?Any medical conditions you have. ?Whether you are pregnant or may be pregnant. ?What are the risks? ?Generally, this is a safe procedure. However, problems may occur, including: ?Harm to a pregnant woman and her unborn baby. This test involves the use of radiation. Radiation exposure can be dangerous to a pregnant  woman and her unborn baby. If you are pregnant, you generally should not have this procedure done. ?Slight increase in the risk of cancer. This is because of the radiation involved in the test. ?What happens before the procedure? ?No preparation is needed for this procedure. ?What happens during the procedure? ?You will undress and remove any jewelry around your neck or chest. ?You will put on a hospital gown. ?Sticky electrodes will be placed on your chest. The electrodes will be connected to an electrocardiogram (ECG) machine to record a tracing of the electrical activity of your heart. ?A CT scanner will take pictures of your heart. During this time, you will be asked to lie still and hold your breath for 2-3 seconds while a picture of your heart is being taken. ?The procedure may vary among health care providers and hospitals. ?What happens after the procedure? ?You can get dressed. ?You can return to your normal activities. ?It is up to you to get the results of your test. Ask your health care provider, or the department that is doing the test, when your results will be ready. ?Summary ?A coronary calcium scan is an imaging test used to look for deposits of calcium and other fatty materials (plaques) in the inner lining of the blood vessels of the heart (coronary arteries). ?Generally, this is a safe procedure. Tell your health care provider if you are pregnant or may be pregnant. ?No preparation is needed for this procedure. ?A CT scanner will take pictures of your heart. ?You can return to your normal activities after the scan is done. ?This information is not intended  to replace advice given to you by your health care provider. Make sure you discuss any questions you have with your health care provider. ?Document Released: 11/19/2007 Document Revised: 04/11/2016 Document Reviewed: 04/11/2016 ?Elsevier Interactive Patient Education ? 2017 Elsevier Inc. ? ?Follow-Up: ?At [facility], you and your health  needs are our priority.  As part of our continuing mission to provide you with exceptional heart care, we have created designated Provider Care Teams.  These Care Teams include your primary Cardiologist (physician) and Advanced Practice Providers (APPs -  Physician Assistants and Nurse Practitioners) who all work together to provide you with the care you need, when you need it. ? ?We recommend signing up for the patient portal called "MyChart".  Sign up information is provided on this After Visit Summary.  MyChart is used to connect with patients for Virtual Visits (Telemedicine).  Patients are able to view lab/test results, encounter notes, upcoming appointments, etc.  Non-urgent messages can be sent to your provider as well.   ?To learn more about what you can do with MyChart, go to [email or web address].   ? ?Your next appointment:   ?12 month(s) ? ?The format for your next appointment:   ?In Person ? ?Provider:   ?Dr. Bjorn Pippin ? ?

## 2021-09-08 DIAGNOSIS — N1831 Chronic kidney disease, stage 3a: Secondary | ICD-10-CM | POA: Diagnosis not present

## 2021-09-16 DIAGNOSIS — F39 Unspecified mood [affective] disorder: Secondary | ICD-10-CM | POA: Diagnosis not present

## 2021-09-24 DIAGNOSIS — G4733 Obstructive sleep apnea (adult) (pediatric): Secondary | ICD-10-CM | POA: Diagnosis not present

## 2021-09-30 DIAGNOSIS — F39 Unspecified mood [affective] disorder: Secondary | ICD-10-CM | POA: Diagnosis not present

## 2021-10-14 DIAGNOSIS — F39 Unspecified mood [affective] disorder: Secondary | ICD-10-CM | POA: Diagnosis not present

## 2021-10-18 ENCOUNTER — Ambulatory Visit (INDEPENDENT_AMBULATORY_CARE_PROVIDER_SITE_OTHER)
Admission: RE | Admit: 2021-10-18 | Discharge: 2021-10-18 | Disposition: A | Discharge status: Home / Self Care | Payer: Self-pay | Source: Ambulatory Visit | Attending: Cardiology | Admitting: Cardiology

## 2021-10-18 DIAGNOSIS — E785 Hyperlipidemia, unspecified: Secondary | ICD-10-CM

## 2021-10-19 ENCOUNTER — Other Ambulatory Visit: Payer: Self-pay | Admitting: *Deleted

## 2021-10-19 DIAGNOSIS — N1831 Chronic kidney disease, stage 3a: Secondary | ICD-10-CM | POA: Diagnosis not present

## 2021-10-19 MED ORDER — EZETIMIBE 10 MG PO TABS: 10.0000 mg | ORAL_TABLET | Freq: Every day | ORAL | 3 refills | Status: DC

## 2021-10-20 ENCOUNTER — Telehealth: Payer: Self-pay | Admitting: Family Medicine

## 2021-10-20 NOTE — Telephone Encounter (Signed)
Will you please get me an updated compliance report? TY! ?

## 2021-10-28 DIAGNOSIS — F39 Unspecified mood [affective] disorder: Secondary | ICD-10-CM | POA: Diagnosis not present

## 2021-11-11 DIAGNOSIS — F39 Unspecified mood [affective] disorder: Secondary | ICD-10-CM | POA: Diagnosis not present

## 2021-11-26 DIAGNOSIS — N1831 Chronic kidney disease, stage 3a: Secondary | ICD-10-CM | POA: Diagnosis not present

## 2021-12-01 DIAGNOSIS — G4733 Obstructive sleep apnea (adult) (pediatric): Secondary | ICD-10-CM | POA: Diagnosis not present

## 2021-12-08 DIAGNOSIS — F840 Autistic disorder: Secondary | ICD-10-CM | POA: Diagnosis not present

## 2021-12-08 DIAGNOSIS — F411 Generalized anxiety disorder: Secondary | ICD-10-CM | POA: Diagnosis not present

## 2021-12-08 DIAGNOSIS — G2569 Other tics of organic origin: Secondary | ICD-10-CM | POA: Diagnosis not present

## 2021-12-08 DIAGNOSIS — F6381 Intermittent explosive disorder: Secondary | ICD-10-CM | POA: Diagnosis not present

## 2021-12-09 DIAGNOSIS — F39 Unspecified mood [affective] disorder: Secondary | ICD-10-CM | POA: Diagnosis not present

## 2021-12-16 DIAGNOSIS — N1831 Chronic kidney disease, stage 3a: Secondary | ICD-10-CM | POA: Diagnosis not present

## 2021-12-20 DIAGNOSIS — M109 Gout, unspecified: Secondary | ICD-10-CM | POA: Diagnosis not present

## 2021-12-20 DIAGNOSIS — I129 Hypertensive chronic kidney disease with stage 1 through stage 4 chronic kidney disease, or unspecified chronic kidney disease: Secondary | ICD-10-CM | POA: Diagnosis not present

## 2021-12-20 DIAGNOSIS — N041 Nephrotic syndrome with focal and segmental glomerular lesions: Secondary | ICD-10-CM | POA: Diagnosis not present

## 2021-12-20 DIAGNOSIS — N1831 Chronic kidney disease, stage 3a: Secondary | ICD-10-CM | POA: Diagnosis not present

## 2021-12-21 DIAGNOSIS — F39 Unspecified mood [affective] disorder: Secondary | ICD-10-CM | POA: Diagnosis not present

## 2021-12-23 IMAGING — DX DG CHEST 1V
1 series · 1 of 1 positions shown · non-contrast
Comparison: 11/12/2019

CLINICAL DATA: Shortness of breath.

EXAM:
CHEST  1 VIEW

[chest ap]
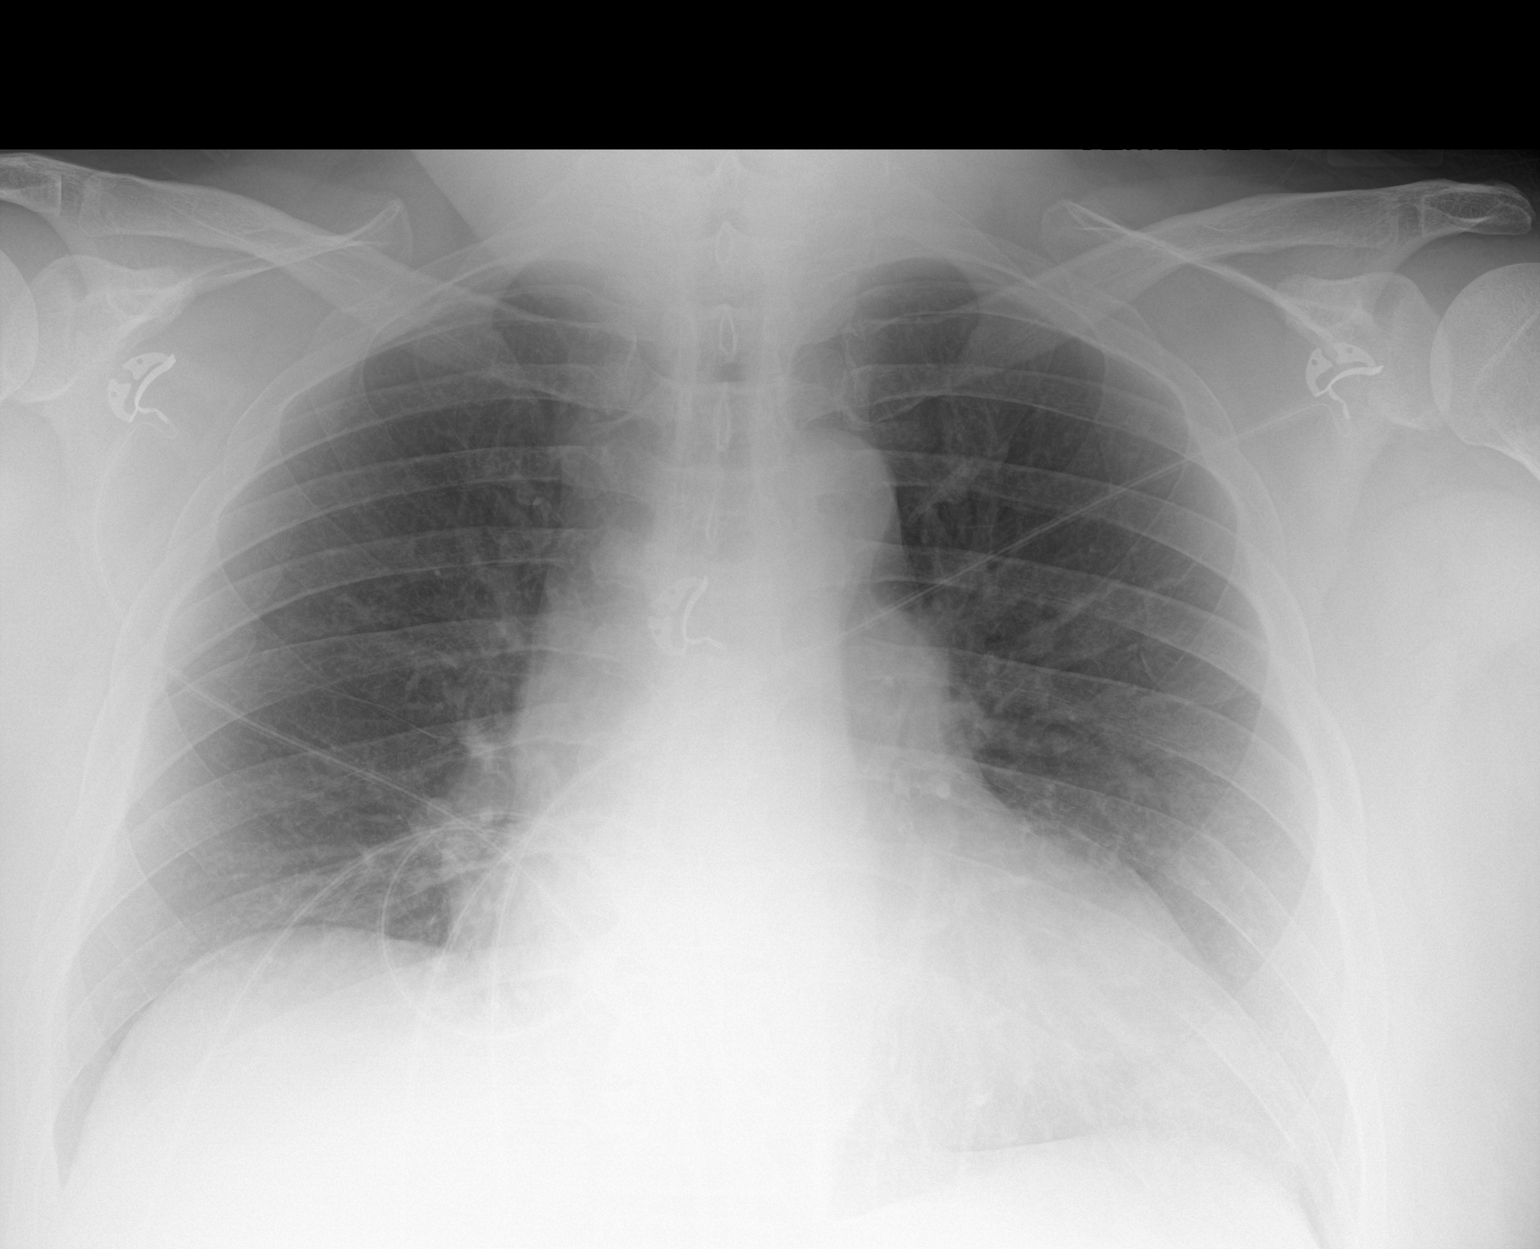

[1 of 1 positions shown; findings below may reference images not displayed]

FINDINGS: Bilateral lung opacities on exam 3 days ago have near completely
resolved. Slightly improved lung volumes from prior. Cardiomegaly.
No new or progressive airspace disease. No pneumothorax or
significant pleural effusion.
IMPRESSION: 1. Near complete resolution of bilateral lung opacities since exam 3
days ago, favoring resolved pulmonary edema. Improved lung aeration
from prior. No new abnormality.
2. Cardiomegaly.

## 2021-12-31 DIAGNOSIS — G4733 Obstructive sleep apnea (adult) (pediatric): Secondary | ICD-10-CM | POA: Diagnosis not present

## 2022-01-06 DIAGNOSIS — F39 Unspecified mood [affective] disorder: Secondary | ICD-10-CM | POA: Diagnosis not present

## 2022-01-19 DIAGNOSIS — N1831 Chronic kidney disease, stage 3a: Secondary | ICD-10-CM | POA: Diagnosis not present

## 2022-01-19 DIAGNOSIS — N041 Nephrotic syndrome with focal and segmental glomerular lesions: Secondary | ICD-10-CM | POA: Diagnosis not present

## 2022-01-20 DIAGNOSIS — F39 Unspecified mood [affective] disorder: Secondary | ICD-10-CM | POA: Diagnosis not present

## 2022-01-31 DIAGNOSIS — G4733 Obstructive sleep apnea (adult) (pediatric): Secondary | ICD-10-CM | POA: Diagnosis not present

## 2022-02-17 DIAGNOSIS — F39 Unspecified mood [affective] disorder: Secondary | ICD-10-CM | POA: Diagnosis not present

## 2022-02-21 DIAGNOSIS — N1831 Chronic kidney disease, stage 3a: Secondary | ICD-10-CM | POA: Diagnosis not present

## 2022-03-07 DIAGNOSIS — F39 Unspecified mood [affective] disorder: Secondary | ICD-10-CM | POA: Diagnosis not present

## 2022-03-08 DIAGNOSIS — G4733 Obstructive sleep apnea (adult) (pediatric): Secondary | ICD-10-CM | POA: Diagnosis not present

## 2022-03-16 DIAGNOSIS — N1831 Chronic kidney disease, stage 3a: Secondary | ICD-10-CM | POA: Diagnosis not present

## 2022-03-17 DIAGNOSIS — F39 Unspecified mood [affective] disorder: Secondary | ICD-10-CM | POA: Diagnosis not present

## 2022-03-22 DIAGNOSIS — I10 Essential (primary) hypertension: Secondary | ICD-10-CM | POA: Diagnosis not present

## 2022-03-22 DIAGNOSIS — N1831 Chronic kidney disease, stage 3a: Secondary | ICD-10-CM | POA: Diagnosis not present

## 2022-03-22 DIAGNOSIS — N041 Nephrotic syndrome with focal and segmental glomerular lesions: Secondary | ICD-10-CM | POA: Diagnosis not present

## 2022-03-31 DIAGNOSIS — F39 Unspecified mood [affective] disorder: Secondary | ICD-10-CM | POA: Diagnosis not present

## 2022-04-08 DIAGNOSIS — G4733 Obstructive sleep apnea (adult) (pediatric): Secondary | ICD-10-CM | POA: Diagnosis not present

## 2022-04-14 DIAGNOSIS — F39 Unspecified mood [affective] disorder: Secondary | ICD-10-CM | POA: Diagnosis not present

## 2022-04-20 DIAGNOSIS — L814 Other melanin hyperpigmentation: Secondary | ICD-10-CM | POA: Diagnosis not present

## 2022-04-20 DIAGNOSIS — L570 Actinic keratosis: Secondary | ICD-10-CM | POA: Diagnosis not present

## 2022-04-20 DIAGNOSIS — D225 Melanocytic nevi of trunk: Secondary | ICD-10-CM | POA: Diagnosis not present

## 2022-04-20 DIAGNOSIS — D2261 Melanocytic nevi of right upper limb, including shoulder: Secondary | ICD-10-CM | POA: Diagnosis not present

## 2022-04-20 DIAGNOSIS — D2262 Melanocytic nevi of left upper limb, including shoulder: Secondary | ICD-10-CM | POA: Diagnosis not present

## 2022-04-26 DIAGNOSIS — N041 Nephrotic syndrome with focal and segmental glomerular lesions: Secondary | ICD-10-CM | POA: Diagnosis not present

## 2022-05-08 DIAGNOSIS — G4733 Obstructive sleep apnea (adult) (pediatric): Secondary | ICD-10-CM | POA: Diagnosis not present

## 2022-05-11 DIAGNOSIS — M546 Pain in thoracic spine: Secondary | ICD-10-CM | POA: Diagnosis not present

## 2022-05-11 DIAGNOSIS — M542 Cervicalgia: Secondary | ICD-10-CM | POA: Diagnosis not present

## 2022-05-12 DIAGNOSIS — F39 Unspecified mood [affective] disorder: Secondary | ICD-10-CM | POA: Diagnosis not present

## 2022-05-25 DIAGNOSIS — N041 Nephrotic syndrome with focal and segmental glomerular lesions: Secondary | ICD-10-CM | POA: Diagnosis not present

## 2022-05-25 DIAGNOSIS — N1831 Chronic kidney disease, stage 3a: Secondary | ICD-10-CM | POA: Diagnosis not present

## 2022-05-26 DIAGNOSIS — F39 Unspecified mood [affective] disorder: Secondary | ICD-10-CM | POA: Diagnosis not present

## 2022-06-09 DIAGNOSIS — F39 Unspecified mood [affective] disorder: Secondary | ICD-10-CM | POA: Diagnosis not present

## 2022-06-17 DIAGNOSIS — G4733 Obstructive sleep apnea (adult) (pediatric): Secondary | ICD-10-CM | POA: Diagnosis not present

## 2022-06-22 DIAGNOSIS — N1831 Chronic kidney disease, stage 3a: Secondary | ICD-10-CM | POA: Diagnosis not present

## 2022-06-23 DIAGNOSIS — F39 Unspecified mood [affective] disorder: Secondary | ICD-10-CM | POA: Diagnosis not present

## 2022-06-30 DIAGNOSIS — N041 Nephrotic syndrome with focal and segmental glomerular lesions: Secondary | ICD-10-CM | POA: Diagnosis not present

## 2022-06-30 DIAGNOSIS — I10 Essential (primary) hypertension: Secondary | ICD-10-CM | POA: Diagnosis not present

## 2022-06-30 DIAGNOSIS — N1831 Chronic kidney disease, stage 3a: Secondary | ICD-10-CM | POA: Diagnosis not present

## 2022-07-07 DIAGNOSIS — F39 Unspecified mood [affective] disorder: Secondary | ICD-10-CM | POA: Diagnosis not present

## 2022-07-18 DIAGNOSIS — G4733 Obstructive sleep apnea (adult) (pediatric): Secondary | ICD-10-CM | POA: Diagnosis not present

## 2022-07-21 DIAGNOSIS — F39 Unspecified mood [affective] disorder: Secondary | ICD-10-CM | POA: Diagnosis not present

## 2022-07-22 DIAGNOSIS — N1831 Chronic kidney disease, stage 3a: Secondary | ICD-10-CM | POA: Diagnosis not present

## 2022-07-28 NOTE — Patient Instructions (Signed)

## 2022-07-28 NOTE — Progress Notes (Signed)
PATIENT: Darren Hamilton DOB: 1960-06-27  REASON FOR VISIT: follow up HISTORY FROM: patient  Chief Complaint  Patient presents with   Follow-up    Pt in room 1, here for cpap follow up. Pt reports doing well on cpap, no concerns.     HISTORY OF PRESENT ILLNESS:  08/01/22 ALL:  Darren Hamilton returns for follow up for OSA on CPAP. He continues to do well on therapy. He continues to do well on therapy. He denies concerns with machine or supplies. He uses memory foam FFM. He denies concerns of leak. He has significant facial hair. He was diagnosed with Covid in 05/2022 for the first time. He experienced for trouble with fatigue than respiratory symptoms. He reports feeling back to baseline within 5 weeks. He is no longer on tacrolimus, pegloticase or torsemide. Focal glomerulosclerosis (FSGS) is in remission. Labs have improved.     07/26/2021 ALL: Darren Hamilton returns for follow up for OSA on CPAP. He is doing well on therapy. He is using his machine every night. He does continue to note increased leak. He has tried numerous mask refitting but does not feel it helped. He is most comfortable with FFM. He wakes feeling refreshed. He feels that he sleeps well. He denies concerns with CPAP or supplies.     03/10/2020 ALL:  Darren Hamilton is a 62 y.o. male here today for follow up for OSA on CPAP. He reports that he has done very well with CPAP therapy over the past 6 months.  He notes significant improvement in sleep quality as well as daytime energy levels with consistent usage of CPAP.  He did try using a chinstrap but states that this did not work out well for him.  He does not feel that mouth breathing continues to be a concern.  He does have a significant amount of facial hair.  He feels that his foam FFM slips off the top of his nose.  He was previously using a silicone fullface mask and is curious if that may work better for him.  Otherwise, he is doing very well.  Compliance report dated  02/08/2020 through 03/08/2020 reveals that he used CPAP 30 of the past 30 days for compliance of 100%.  He used CPAP greater than 4 hours all 30 days.  Average usage was 7 hours and 34 minutes.  Residual AHI was elevated at 6.0 on 7 to 16 cm of water pressure and an EPR of 3.  A significant leak remains in the 95th percentile of 48.5 L/min.  HISTORY: (copied from Darren [name] note on 09/10/2019)  Darren. 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  The patient is unaccompanied today. I saw him on 06/11/2019, at which time he was fully compliant with his AutoPap, he felt like he was sleeping better.  He had an interim issue with his kidneys, was diagnosed with focal glomerulosclerosis.  He had some issues with the nasal mass causing almost like an acne-like outbreak around the nose.  He had lost weight.  I suggested we proceed with an overnight pulse oximetry test to make sure his oxygen saturations were adequate while in was on his AutoPap therapy.  He had a overnight pulse oximetry test on 06/14/2019 which showed an average oxygen saturation of 94%, nadir was 84%, time below or at 88% saturation while  on room air on AutoPap was 43 seconds.  We attempted to call him with his test results.  He was advised to continue with AutoPap therapy.   Today, 09/09/2019: I reviewed his AutoPap compliance data from 08/10/2019 through 09/08/2019, which is a total of 30 days, during which time he used his machine every night with percent use days greater than 4 hours at 100%, indicating superb compliance with an average usage of 6 hours and 55 minutes, residual AHI borderline at 4.9/h, average pressure for the 95th percentile at 15.8 cm, leak high with a 95th percentile at 53.1 L/min on a pressure range of 7 cm to 16 cm with  EPR.  We also reviewed his overnight pulse oximetry test results from 06/14/2019.  He has been compliant with his CPAP, continues to benefit from it but reports significant weight fluctuations, particularly with fluid weight.  He is currently on prednisone.  He was treated with a diuretic recently, follows closely with nephrology.  He keeps his beard trimmed, the full facemask is still comfortable enough with the memory foam and he is mindful about changing his supplies on a timely basis but has noticed air leaking from the mouth, he has a tendency to have his mouth open at night.  He wants to continue to work on weight loss but it is frustrating for him because of exercise intolerance, feeling fatigued, and fluid retention.  He is motivated to continue with his AutoPap.   The patient's allergies, current medications, family history, past medical history, past social history, past surgical history and problem list were reviewed and updated as appropriate.    Previously:    I first met him on 02/05/2019 at the request of his primary care physician, at which time the patient reported snoring and excessive daytime somnolence as well as witnessed apneas.  He was advised to proceed with sleep study testing.  He had a home sleep test on 02/20/2019 which showed severe obstructive sleep apnea with a total AHI of 86.4/hour and O2 nadir of 67%.  Since his insurance did not approve a laboratory attended sleep study, he was advised to proceed with AutoPap therapy at home.   I reviewed his AutoPap compliance data from 05/11/2019 through 06/09/2019 which is a total of 30 days, during which time he used his machine every night with percent use days greater than 4 hours at 100%, indicating superb compliance with an average usage of 7 hours and 42 minutes, residual AHI borderline at 4.6/h, 95th percentile of pressure at 14.8 cm with a range of 7 cm to 15 cm with EPR of 3, leak on the higher end with a 95th percentile at 21.8 L/min.       02/05/2019: (He) reports snoring, excessive daytime sleepiness and witnessed apneas per wife's report.  I reviewed your office note from 12/25/2018. His Epworth sleepiness score is 19 out of 24, fatigue severity score is 54 out of 63.  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  His bedtime is generally around 11 but sometimes later than that.  His rise time is generally around 7 AM.  He lives with his wife and 31 year old son who has autism.  He has a TV in the bedroom but they hardly use it.  They have 2 cats in the household who do not bother them at night.  He has recently noticed lower extremity edema.  He has been referred for an echocardiogram.  He had a stress test several years ago which  was benign per his report.  He has gained over the past several years.  He has rare morning headaches, nocturia about once per average night, admits to drinking quite a bit of caffeine in the form of coffee, about a 8 cup pot per day and 1 serving of tea.  He does not have any telltale symptoms of restless leg syndrome but is a restless sleeper.  His wife has commented on his loud snoring which is worse when he is on his back.  He tries to sleep on his sides.  He is not aware of any family history of sleep apnea but suspects that his mom may have had it, she was a loud snorer, she passed away recently at age 34, father passed away from complications of heart disease at age 47.   REVIEW OF SYSTEMS: Out of a complete 14 system review of symptoms, the patient complains only of the following symptoms, fatigue and all other reviewed systems are negative.  ESS: 4/24  ALLERGIES: No Known Allergies  HOME MEDICATIONS: Outpatient Medications Prior to Visit  Medication Sig Dispense Refill   acetaminophen (TYLENOL) 500 MG tablet Take 500-1,000 mg by mouth every 6 (six) hours as needed for mild pain or moderate pain.     albuterol (VENTOLIN HFA) 108 (90 Base) MCG/ACT inhaler TAKE 2 PUFFS BY MOUTH EVERY 6 HOURS  AS NEEDED FOR WHEEZE OR SHORTNESS OF BREATH 8.5 each 11   Cholecalciferol (DIALYVITE VITAMIN D 5000 PO) Take 5,000 Units by mouth daily.      colchicine 0.6 MG tablet Take 1 tablet (0.6 mg total) by mouth daily. 30 tablet 0   escitalopram (LEXAPRO) 20 MG tablet TAKE 1 TABLET (20 MG TOTAL) BY MOUTH DAILY. START 1/2 TAB DAILY, MAY INCREASE TO 1 DAILY AFTER 1 WEEK 90 tablet 3   ezetimibe (ZETIA) 10 MG tablet Take 1 tablet (10 mg total) by mouth daily. 90 tablet 3   FARXIGA 10 MG TABS tablet Take 10 mg by mouth daily.     gemfibrozil (LOPID) 600 MG tablet Take 1 tablet (600 mg total) by mouth 2 (two) times daily with a meal. 180 tablet 3   levocetirizine (XYZAL) 5 MG tablet daily.     lisinopril (ZESTRIL) 10 MG tablet TAKE 1 TABLET BY MOUTH EVERY DAY 90 tablet 3   QVAR REDIHALER 80 MCG/ACT inhaler 1 puff 2 (two) times daily.     rosuvastatin (CRESTOR) 20 MG tablet TAKE 1 TABLET BY MOUTH EVERY DAY 90 tablet 1   tacrolimus (PROGRAF) 1 MG capsule Take 2 mg by mouth 2 (two) times daily. '2mg'$  morning '1mg'$  at night (Patient not taking: Reported on 08/01/2022)     torsemide (DEMADEX) 100 MG tablet Take 100 mg by mouth daily. (Patient not taking: Reported on 08/01/2022)     pegloticase (KRYSTEXXA) 8 MG/ML injection Inject into the vein every 14 (fourteen) days. (Patient not taking: Reported on 08/01/2022)     No facility-administered medications prior to visit.    PAST MEDICAL HISTORY: Past Medical History:  Diagnosis Date   Anxiety    Asthma    Depression    Hypertension    Nephrotic syndrome     PAST SURGICAL HISTORY: Past Surgical History:  Procedure Laterality Date   RENAL BIOPSY      FAMILY HISTORY: Family History  Problem Relation Age of Onset   Heart failure Mother    Heart failure Father    Celiac disease Sister    Diabetes Maternal Grandmother  Heart disease Maternal Grandmother    Kidney failure Maternal Grandfather    Lung cancer Paternal Grandmother    Cancer Paternal  Grandmother    Heart disease Paternal Grandfather    Celiac disease Other    Prostate cancer Neg Hx    Colon cancer Neg Hx     SOCIAL HISTORY: Social History   Socioeconomic History   Marital status: Married    Spouse name: Not on file   Number of children: Not on file   Years of education: Not on file   Highest education level: Not on file  Occupational History   Not on file  Tobacco Use   Smoking status: Never   Smokeless tobacco: Never  Vaping Use   Vaping Use: Never used  Substance and Sexual Activity   Alcohol use: Yes    Alcohol/week: 3.0 standard drinks of alcohol    Types: 1 Glasses of wine, 1 Cans of beer, 1 Shots of liquor per week    Comment: occasionally   Drug use: No   Sexual activity: Not on file  Other Topics Concern   Not on file  Social History Narrative   Not on file   Social Determinants of Health   Financial Resource Strain: Not on file  Food Insecurity: Not on file  Transportation Needs: Not on file  Physical Activity: Not on file  Stress: Not on file  Social Connections: Not on file  Intimate Partner Violence: Not on file      PHYSICAL EXAM  Vitals:   08/01/22 0910  BP: 129/84  Pulse: 94  Weight: 273 lb (123.8 kg)  Height: '5\' 8"'$  (1.727 m)     Body mass index is 41.51 kg/m.  Generalized: Well developed, in no acute distress  Cardiology: normal rate and rhythm, no murmur noted Respiratory: clear to auscultation bilaterally  Neurological examination  Mentation: Alert oriented to time, place, history taking. Follows all commands speech and language fluent Cranial nerve II-XII: Pupils were equal round reactive to light. Extraocular movements were full, visual field were full  Motor: The motor testing reveals 5 over 5 strength of all 4 extremities. Good symmetric motor tone is noted throughout.  Gait and station: Gait is wide but stable    DIAGNOSTIC DATA (LABS, IMAGING, TESTING) - I reviewed patient records, labs, notes,  testing and imaging myself where available.      No data to display           Lab Results  Component Value Date   WBC 11.4 (H) 11/16/2019   HGB 11.0 (L) 11/16/2019   HCT 33.5 (L) 11/16/2019   MCV 93.3 11/16/2019   PLT 412 (H) 11/16/2019      Component Value Date/Time   NA 139 11/19/2019 0904   NA 138 04/09/2019 0822   K 3.9 11/19/2019 0904   CL 100 11/19/2019 0904   CO2 29 11/19/2019 0904   GLUCOSE 105 (H) 11/19/2019 0904   BUN 49 (H) 11/19/2019 0904   BUN 20 04/09/2019 0822   CREATININE 1.75 (H) 11/19/2019 0904   CREATININE 1.05 12/25/2018 1700   CALCIUM 9.0 11/19/2019 0904   PROT 5.9 (L) 11/08/2019 0503   PROT 4.3 (LL) 04/01/2019 1246   ALBUMIN 2.1 (L) 11/19/2019 0904   ALBUMIN 2.0 (L) 04/01/2019 1246   AST 13 (L) 11/08/2019 0503   ALT 14 11/08/2019 0503   ALKPHOS 49 11/08/2019 0503   BILITOT 0.6 11/08/2019 0503   BILITOT <0.2 04/01/2019 1246   GFRNONAA 43 (L)  11/19/2019 0904   GFRAA 49 (L) 11/19/2019 0904   Lab Results  Component Value Date   CHOL 204 (H) 09/07/2021   HDL 36 (L) 09/07/2021   LDLCALC 104 (H) 09/07/2021   TRIG 376 (H) 09/07/2021   CHOLHDL 5.7 (H) 09/07/2021   Lab Results  Component Value Date   HGBA1C 6.0 (H) 03/31/2020   No results found for: "VITAMINB12" Lab Results  Component Value Date   TSH 4.64 (H) 03/31/2020       ASSESSMENT AND PLAN 62 y.o. year old male  has a past medical history of Anxiety, Asthma, Depression, Hypertension, and Nephrotic syndrome. here with     ICD-10-CM   1. OSA on CPAP  G47.33 For home use only DME continuous positive airway pressure (CPAP)       Darren Hamilton is doing well with CPAP compliance.  Compliance report reveals daily and 4-hour compliance of 100%.  I have commended him on his excellent compliance and encouraged him to continue using CPAP nightly and for greater than 4 hours each night.  Residual AHI is 3.8 events per hour. Baseline AHI 86.4/hr. I suspect that his facial hair is most likely  contributing to leak. He has tried multiple mask and feels that current FFM works best for him. He will continue to monitor for leak at home. Healthy lifestyle habits encouraged.  He will follow-up with [place] in 1 year, sooner if needed.  He verbalizes understanding and agreement with this plan.   Orders Placed This Encounter  Procedures   For home use only DME continuous positive airway pressure (CPAP)    Supplies    Order Specific Question:   Length of Need    Answer:   Lifetime    Order Specific Question:   Patient has OSA or probable OSA    Answer:   Yes    Order Specific Question:   Is the patient currently using CPAP in the home    Answer:   Yes    Order Specific Question:   Settings    Answer:   Other see comments    Order Specific Question:   CPAP supplies needed    Answer:   Mask, headgear, cushions, filters, heated tubing and water chamber     No orders of the defined types were placed in this encounter.    Debbora Presto, FNP-C 08/01/2022, 9:47 AM [facility] [address] [place], [place] [address] [phone]

## 2022-08-01 ENCOUNTER — Encounter: Payer: Self-pay | Admitting: Family Medicine

## 2022-08-01 ENCOUNTER — Ambulatory Visit: Payer: BC Managed Care – PPO | Admitting: Family Medicine

## 2022-08-01 VITALS — BP 129/84 | HR 94 | Ht 68.0 in | Wt 273.0 lb

## 2022-08-01 DIAGNOSIS — G4733 Obstructive sleep apnea (adult) (pediatric): Secondary | ICD-10-CM

## 2022-08-04 DIAGNOSIS — F39 Unspecified mood [affective] disorder: Secondary | ICD-10-CM | POA: Diagnosis not present

## 2022-08-16 DIAGNOSIS — G4733 Obstructive sleep apnea (adult) (pediatric): Secondary | ICD-10-CM | POA: Diagnosis not present

## 2022-08-18 DIAGNOSIS — F39 Unspecified mood [affective] disorder: Secondary | ICD-10-CM | POA: Diagnosis not present

## 2022-08-22 DIAGNOSIS — N1831 Chronic kidney disease, stage 3a: Secondary | ICD-10-CM | POA: Diagnosis not present

## 2022-09-01 DIAGNOSIS — F39 Unspecified mood [affective] disorder: Secondary | ICD-10-CM | POA: Diagnosis not present

## 2022-09-15 DIAGNOSIS — F39 Unspecified mood [affective] disorder: Secondary | ICD-10-CM | POA: Diagnosis not present

## 2022-09-22 DIAGNOSIS — N1831 Chronic kidney disease, stage 3a: Secondary | ICD-10-CM | POA: Diagnosis not present

## 2022-09-27 DIAGNOSIS — N1831 Chronic kidney disease, stage 3a: Secondary | ICD-10-CM | POA: Diagnosis not present

## 2022-09-27 DIAGNOSIS — N041 Nephrotic syndrome with focal and segmental glomerular lesions: Secondary | ICD-10-CM | POA: Diagnosis not present

## 2022-09-27 DIAGNOSIS — I10 Essential (primary) hypertension: Secondary | ICD-10-CM | POA: Diagnosis not present

## 2022-09-29 DIAGNOSIS — F39 Unspecified mood [affective] disorder: Secondary | ICD-10-CM | POA: Diagnosis not present

## 2022-10-02 ENCOUNTER — Other Ambulatory Visit: Payer: Self-pay | Admitting: Cardiology

## 2022-10-12 DIAGNOSIS — G4733 Obstructive sleep apnea (adult) (pediatric): Secondary | ICD-10-CM | POA: Diagnosis not present

## 2022-10-13 DIAGNOSIS — F39 Unspecified mood [affective] disorder: Secondary | ICD-10-CM | POA: Diagnosis not present

## 2022-10-31 ENCOUNTER — Other Ambulatory Visit: Payer: Self-pay | Admitting: Cardiology

## 2022-11-01 DIAGNOSIS — N1831 Chronic kidney disease, stage 3a: Secondary | ICD-10-CM | POA: Diagnosis not present

## 2022-11-03 DIAGNOSIS — F39 Unspecified mood [affective] disorder: Secondary | ICD-10-CM | POA: Diagnosis not present

## 2022-11-10 ENCOUNTER — Other Ambulatory Visit: Payer: Self-pay | Admitting: Cardiology

## 2022-11-12 DIAGNOSIS — G4733 Obstructive sleep apnea (adult) (pediatric): Secondary | ICD-10-CM | POA: Diagnosis not present

## 2022-11-17 DIAGNOSIS — F39 Unspecified mood [affective] disorder: Secondary | ICD-10-CM | POA: Diagnosis not present

## 2022-11-29 DIAGNOSIS — N1831 Chronic kidney disease, stage 3a: Secondary | ICD-10-CM | POA: Diagnosis not present

## 2022-11-30 DIAGNOSIS — M546 Pain in thoracic spine: Secondary | ICD-10-CM | POA: Diagnosis not present

## 2022-12-01 DIAGNOSIS — F39 Unspecified mood [affective] disorder: Secondary | ICD-10-CM | POA: Diagnosis not present

## 2022-12-06 NOTE — Progress Notes (Signed)
Cardiology Office Note:    Date:  12/09/2022   ID:  Darren Hamilton, DOB 1960-10-10, MRN [id number]  PCP:  Lavada Mesi, MD  Cardiologist:  None  Electrophysiologist:  None   Referring MD: Lavada Mesi, MD   No chief complaint on file.   History of Present Illness:    Darren Hamilton is a 62 y.o. male with a hx of hypertension, hyperlipidemia, OSA, diastolic dysfunction, nephrotic syndrome who presents for follow-up.  Patient presented to the [facility] ED on 02/24/2019 with complaint of leg swelling.  Lower extremity duplex showed no evidence of DVT.  BNP was over 1100.  He was started on Lasix 20 mg daily and discharged from the ED.  He did not respond to Lasix and this was changed to torsemide 20 mg BID as an outpatient.  TTE on 02/25/2019 showed normal LV systolic function, grade 1 diastolic dysfunction, mild LVH, normal RV size and function, no significant valvular disease, small/collapsible IVC.   He was referred to cardiology for initial visit on 04/01/2019.  Given his presentation with only mild diastolic dysfunction with significant volume overloaded, labs were checked and showed significant reduction in albumin (2.0) with 4+ proteinuria on urinalysis.  Spot protein to creatinine ratio showed nephrotic range proteinuria.  He was referred to nephrology for further evaluation.  Underwent renal biopsy, which showed FSGS.  Calcium score 102 on 10/18/2021 (73rd percentile).  Since last clinic visit, he reports he is doing well.  States his FSGS is in remission.  He is off tacrolimus and is now taking torsemide as needed.  Denies any chest pain, dyspnea, or palpitations.  Reports some lightheadedness when he takes his diuretics but denies any syncope.  Has not been exercising.  Continues to practice outside podiatrist, but no longer doing surgery.  Wt Readings from Last 3 Encounters:  12/09/22 283 lb 12.8 oz (128.7 kg)  08/01/22 273 lb (123.8 kg)  09/07/21 268 lb 9.6 oz (121.8 kg)      Past Medical History:  Diagnosis Date   Anxiety    Asthma    Depression    Hypertension    Nephrotic syndrome     Past Surgical History:  Procedure Laterality Date   RENAL BIOPSY      Current Medications: Current Meds  Medication Sig   acetaminophen (TYLENOL) 500 MG tablet Take 500-1,000 mg by mouth every 6 (six) hours as needed for mild pain or moderate pain.   albuterol (VENTOLIN HFA) 108 (90 Base) MCG/ACT inhaler TAKE 2 PUFFS BY MOUTH EVERY 6 HOURS AS NEEDED FOR WHEEZE OR SHORTNESS OF BREATH   Cholecalciferol (DIALYVITE VITAMIN D 5000 PO) Take 5,000 Units by mouth daily.    colchicine 0.6 MG tablet Take 1 tablet (0.6 mg total) by mouth daily.   escitalopram (LEXAPRO) 20 MG tablet TAKE 1 TABLET (20 MG TOTAL) BY MOUTH DAILY. START 1/2 TAB DAILY, MAY INCREASE TO 1 DAILY AFTER 1 WEEK   ezetimibe (ZETIA) 10 MG tablet Take 1 tablet (10 mg total) by mouth daily. PT. WILL NEED TO MAKE AN APPOINTMENT IN ORDER TO RECEIVE FUTURE REFILLS, FIRST ATTEMPT.   FARXIGA 10 MG TABS tablet Take 10 mg by mouth daily.   febuxostat (ULORIC) 40 MG tablet Take 40 mg by mouth daily.   gemfibrozil (LOPID) 600 MG tablet Take 1 tablet (600 mg total) by mouth 2 (two) times daily with a meal.   levocetirizine (XYZAL) 5 MG tablet daily.   lisinopril (ZESTRIL) 10 MG tablet TAKE 1 TABLET  BY MOUTH EVERY DAY   montelukast (SINGULAIR) 10 MG tablet Take 10 mg by mouth at bedtime.   QVAR REDIHALER 80 MCG/ACT inhaler 1 puff 2 (two) times daily.   rosuvastatin (CRESTOR) 20 MG tablet TAKE 1 TABLET BY MOUTH EVERY DAY   torsemide (DEMADEX) 100 MG tablet Take 100 mg by mouth as needed.     Allergies:   Patient has no known allergies.   Social History   Socioeconomic History   Marital status: Married    Spouse name: Not on file   Number of children: Not on file   Years of education: Not on file   Highest education level: Not on file  Occupational History   Not on file  Tobacco Use   Smoking status: Never    Smokeless tobacco: Never  Vaping Use   Vaping Use: Never used  Substance and Sexual Activity   Alcohol use: Yes    Alcohol/week: 3.0 standard drinks of alcohol    Types: 1 Glasses of wine, 1 Cans of beer, 1 Shots of liquor per week    Comment: occasionally   Drug use: No   Sexual activity: Not on file  Other Topics Concern   Not on file  Social History Narrative   Not on file   Social Determinants of Health   Financial Resource Strain: Not on file  Food Insecurity: Not on file  Transportation Needs: Not on file  Physical Activity: Not on file  Stress: Not on file  Social Connections: Not on file     Family History: Father was diagnosed with CAD in early 11s, underwent CABG and AVR.    ROS:   Please see the history of present illness.     All other systems reviewed and are negative.  EKGs/Labs/Other Studies Reviewed:    The following studies were reviewed today:   EKG:   09/07/2021 normal sinus rhythm, rate 72, Q waves in leads III, aVF, poor R wave progression, unchanged from prior 12/09/2022: Normal sinus rhythm, rate 71, Q waves in lead III, aVF, poor R wave progression  TTE 02/25/19: 1. Left ventricular ejection fraction, by visual estimation, is 55 to 60%. The left ventricle has normal function. There is mildly increased left ventricular hypertrophy.  2. The mitral valve is normal in structure. No evidence of mitral valve regurgitation. No evidence of mitral stenosis.  3. The aortic valve is tricuspid Aortic valve regurgitation was not visualized by color flow Doppler. Mild aortic valve sclerosis without stenosis.  4. There is mild dilatation of the ascending aorta measuring 38 mm.  5. Global right ventricle has normal systolic function.The right ventricular size is normal. No increase in right ventricular wall thickness.  6. Left atrial size was normal.  7. Right atrial size was normal.  8. Left ventricular diastolic Doppler parameters are consistent with impaired  relaxation pattern of LV diastolic filling.  9. The inferior vena cava is normal in size with greater than 50% respiratory variability, suggesting right atrial pressure of 3 mmHg. 10. The tricuspid valve is normal in structure. Tricuspid valve regurgitation was not visualized by color flow Doppler.  Recent Labs: No results found for requested labs within last 365 days.  Recent Lipid Panel    Component Value Date/Time   CHOL 204 (H) 09/07/2021 0846   TRIG 376 (H) 09/07/2021 0846   HDL 36 (L) 09/07/2021 0846   CHOLHDL 5.7 (H) 09/07/2021 0846   CHOLHDL 9.5 (H) 03/31/2020 1342   LDLCALC 104 (H) 09/07/2021 1610  LDLCALC  03/31/2020 1342     Comment:     . LDL cholesterol not calculated. Triglyceride levels greater than 400 mg/dL invalidate calculated LDL results. . Reference range: <100 . Desirable range <100 mg/dL for primary prevention;   <70 mg/dL for patients with CHD or diabetic patients  with > or = 2 CHD risk factors. Marland Kitchen LDL-C is now calculated using the Martin-Hopkins  calculation, which is a validated novel method providing  better accuracy than the Friedewald equation in the  estimation of LDL-C.  Horald Pollen et al. Lenox Ahr. 6578;469(62): 2061-2068  (http://education.[employer].com/faq/FAQ164)     Physical Exam:    VS:  BP 130/82 (BP Location: Right Arm, Patient Position: Sitting, Cuff Size: Large)   Pulse 71   Ht 5\' 8"  (1.727 m)   Wt 283 lb 12.8 oz (128.7 kg)   BMI 43.15 kg/m     Wt Readings from Last 3 Encounters:  12/09/22 283 lb 12.8 oz (128.7 kg)  08/01/22 273 lb (123.8 kg)  09/07/21 268 lb 9.6 oz (121.8 kg)     GEN:   in no acute distress HEENT: Normal NECK: No JVD  CARDIAC: RRR, no murmurs, rubs, gallops RESPIRATORY:  Clear to auscultation without rales, wheezing or rhonchi  ABDOMEN: Soft, non-tender, non-distended MUSCULOSKELETAL:  No LE edema SKIN: Warm and dry NEUROLOGIC:  Alert and oriented x 3 PSYCHIATRIC:  Normal affect   ASSESSMENT:     1. Coronary artery disease involving native coronary artery of native heart without angina pectoris   2. Hyperlipidemia, unspecified hyperlipidemia type   3. Essential hypertension   4. Lower extremity edema      PLAN:    CAD: Calcium score 102 on 10/18/2021 (73rd percentile).  Denies any anginal symptoms -Continue rosuvastatin 20 mg daily and Zetia 10 mg daily.  Check lipid panel  LE Edema/dyspnea: Due to nephrotic syndrome, with renal biopsy showing FSGS.  He is following with nephrology and was on tacrolimus.  Had good response, FSGS now in remission.  He is on tacrolimus and is now taking torsemide as needed.  Mild diastolic dysfunction on TTE, do not suspect it is contributing much to his presentation.  Continue management per nephrology.  Hypertension: On lisinopril 10 mg daily.  Appears controlled  Hyperlipidemia: LDL 213 on 07/06/2020, started rosuvastatin 20 mg daily.  LDL 104 on 09/07/2021.  Calcium score 102 on 10/18/2021 (73rd percentile).  Zetia 10 mg daily added.  Check lipid panel, goal LDL less than 70 given CAD  OSA: continue CPAP, reports compliance  RTC in 1 year   Medication Adjustments/Labs and Tests Ordered: Current medicines are reviewed at length with the patient today.  Concerns regarding medicines are outlined above.  Orders Placed This Encounter  Procedures   Lipid panel   EKG 12-Lead   No orders of the defined types were placed in this encounter.   Patient Instructions  Medication Instructions:  Continue same medications *If you need a refill on your cardiac medications before your next appointment, please call your pharmacy*   Lab Work: Lipid panel today   Testing/Procedures: None ordered   Follow-Up: At [facility], you and your health needs are our priority.  As part of our continuing mission to provide you with exceptional heart care, we have created designated Provider Care Teams.  These Care Teams include your primary  Cardiologist (physician) and Advanced Practice Providers (APPs -  Physician Assistants and Nurse Practitioners) who all work together to provide you with the care you need,  when you need it.  We recommend signing up for the patient portal called "MyChart".  Sign up information is provided on this After Visit Summary.  MyChart is used to connect with patients for Virtual Visits (Telemedicine).  Patients are able to view lab/test results, encounter notes, upcoming appointments, etc.  Non-urgent messages can be sent to your provider as well.   To learn more about what you can do with MyChart, go to [email or web address].    Your next appointment:  1 year     Call in March to schedule July appointment     Provider:  Dr.Jaelee Laughter     Signed, Little Ishikawa, MD  12/09/2022 9:11 AM    [facility]

## 2022-12-09 ENCOUNTER — Ambulatory Visit (INDEPENDENT_AMBULATORY_CARE_PROVIDER_SITE_OTHER): Payer: BC Managed Care – PPO | Admitting: Cardiology

## 2022-12-09 ENCOUNTER — Encounter (HOSPITAL_BASED_OUTPATIENT_CLINIC_OR_DEPARTMENT_OTHER): Payer: Self-pay | Admitting: Cardiology

## 2022-12-09 VITALS — BP 130/82 | HR 71 | Ht 68.0 in | Wt 283.8 lb

## 2022-12-09 DIAGNOSIS — R0609 Other forms of dyspnea: Secondary | ICD-10-CM | POA: Diagnosis not present

## 2022-12-09 DIAGNOSIS — E785 Hyperlipidemia, unspecified: Secondary | ICD-10-CM

## 2022-12-09 DIAGNOSIS — R600 Localized edema: Secondary | ICD-10-CM | POA: Diagnosis not present

## 2022-12-09 DIAGNOSIS — I2510 Atherosclerotic heart disease of native coronary artery without angina pectoris: Secondary | ICD-10-CM | POA: Diagnosis not present

## 2022-12-09 DIAGNOSIS — I10 Essential (primary) hypertension: Secondary | ICD-10-CM | POA: Diagnosis not present

## 2022-12-09 NOTE — Patient Instructions (Signed)
Medication Instructions:  Continue same medications *If you need a refill on your cardiac medications before your next appointment, please call your pharmacy*   Lab Work: Lipid panel today   Testing/Procedures: None ordered   Follow-Up: At [facility], you and your health needs are our priority.  As part of our continuing mission to provide you with exceptional heart care, we have created designated Provider Care Teams.  These Care Teams include your primary Cardiologist (physician) and Advanced Practice Providers (APPs -  Physician Assistants and Nurse Practitioners) who all work together to provide you with the care you need, when you need it.  We recommend signing up for the patient portal called "MyChart".  Sign up information is provided on this After Visit Summary.  MyChart is used to connect with patients for Virtual Visits (Telemedicine).  Patients are able to view lab/test results, encounter notes, upcoming appointments, etc.  Non-urgent messages can be sent to your provider as well.   To learn more about what you can do with MyChart, go to [email or web address].    Your next appointment:  1 year     Call in March to schedule July appointment     Provider:  Dr.Schumann

## 2022-12-10 LAB — LIPID PANEL
Chol/HDL Ratio: 3.1 ratio (ref 0.0–5.0)
Cholesterol, Total: 159 mg/dL (ref 100–199)
HDL: 52 mg/dL (ref >39)
LDL Chol Calc (NIH): 94 mg/dL (ref 0–99)
Triglycerides: 64 mg/dL (ref 0–149)
VLDL Cholesterol Cal: 13 mg/dL (ref 5–40)

## 2022-12-12 DIAGNOSIS — G4733 Obstructive sleep apnea (adult) (pediatric): Secondary | ICD-10-CM | POA: Diagnosis not present

## 2022-12-13 ENCOUNTER — Other Ambulatory Visit: Payer: Self-pay

## 2022-12-13 ENCOUNTER — Telehealth: Payer: Self-pay | Admitting: Cardiology

## 2022-12-13 DIAGNOSIS — E785 Hyperlipidemia, unspecified: Secondary | ICD-10-CM

## 2022-12-13 DIAGNOSIS — N1831 Chronic kidney disease, stage 3a: Secondary | ICD-10-CM | POA: Diagnosis not present

## 2022-12-13 DIAGNOSIS — N041 Nephrotic syndrome with focal and segmental glomerular lesions: Secondary | ICD-10-CM | POA: Diagnosis not present

## 2022-12-13 DIAGNOSIS — I2510 Atherosclerotic heart disease of native coronary artery without angina pectoris: Secondary | ICD-10-CM

## 2022-12-13 DIAGNOSIS — I10 Essential (primary) hypertension: Secondary | ICD-10-CM | POA: Diagnosis not present

## 2022-12-13 MED ORDER — ROSUVASTATIN CALCIUM 40 MG PO TABS: 40.0000 mg | ORAL_TABLET | Freq: Every day | ORAL | 3 refills | Status: DC

## 2022-12-13 NOTE — Telephone Encounter (Signed)
Pt returning call for lab results  

## 2022-12-13 NOTE — Telephone Encounter (Signed)
Discussed patient lab results.  Lab ordered and new RX sent.  Patient aware that Labs are fasting.

## 2022-12-15 DIAGNOSIS — F39 Unspecified mood [affective] disorder: Secondary | ICD-10-CM | POA: Diagnosis not present

## 2022-12-18 ENCOUNTER — Other Ambulatory Visit: Payer: Self-pay | Admitting: Cardiology

## 2022-12-28 DIAGNOSIS — N1831 Chronic kidney disease, stage 3a: Secondary | ICD-10-CM | POA: Diagnosis not present

## 2023-01-05 DIAGNOSIS — F39 Unspecified mood [affective] disorder: Secondary | ICD-10-CM | POA: Diagnosis not present

## 2023-01-19 DIAGNOSIS — F39 Unspecified mood [affective] disorder: Secondary | ICD-10-CM | POA: Diagnosis not present

## 2023-01-24 DIAGNOSIS — G4733 Obstructive sleep apnea (adult) (pediatric): Secondary | ICD-10-CM | POA: Diagnosis not present

## 2023-01-30 DIAGNOSIS — N1831 Chronic kidney disease, stage 3a: Secondary | ICD-10-CM | POA: Diagnosis not present

## 2023-02-02 DIAGNOSIS — F39 Unspecified mood [affective] disorder: Secondary | ICD-10-CM | POA: Diagnosis not present

## 2023-02-16 DIAGNOSIS — F39 Unspecified mood [affective] disorder: Secondary | ICD-10-CM | POA: Diagnosis not present

## 2023-02-24 DIAGNOSIS — G4733 Obstructive sleep apnea (adult) (pediatric): Secondary | ICD-10-CM | POA: Diagnosis not present

## 2023-03-07 DIAGNOSIS — N1831 Chronic kidney disease, stage 3a: Secondary | ICD-10-CM | POA: Diagnosis not present

## 2023-03-15 DIAGNOSIS — I10 Essential (primary) hypertension: Secondary | ICD-10-CM | POA: Diagnosis not present

## 2023-03-15 DIAGNOSIS — N041 Nephrotic syndrome with focal and segmental glomerular lesions: Secondary | ICD-10-CM | POA: Diagnosis not present

## 2023-03-15 DIAGNOSIS — N1831 Chronic kidney disease, stage 3a: Secondary | ICD-10-CM | POA: Diagnosis not present

## 2023-03-15 DIAGNOSIS — E66813 Obesity, class 3: Secondary | ICD-10-CM | POA: Diagnosis not present

## 2023-03-16 DIAGNOSIS — F39 Unspecified mood [affective] disorder: Secondary | ICD-10-CM | POA: Diagnosis not present

## 2023-03-22 ENCOUNTER — Telehealth: Payer: Self-pay | Admitting: Family Medicine

## 2023-03-22 ENCOUNTER — Encounter: Payer: Self-pay | Admitting: Family Medicine

## 2023-03-22 NOTE — Telephone Encounter (Signed)
LVM and sent letter in mail informing pt of need to reschedule 08/08/23 appt - NP out

## 2023-03-26 DIAGNOSIS — G4733 Obstructive sleep apnea (adult) (pediatric): Secondary | ICD-10-CM | POA: Diagnosis not present

## 2023-03-30 DIAGNOSIS — F39 Unspecified mood [affective] disorder: Secondary | ICD-10-CM | POA: Diagnosis not present

## 2023-04-03 DIAGNOSIS — N1831 Chronic kidney disease, stage 3a: Secondary | ICD-10-CM | POA: Diagnosis not present

## 2023-04-06 DIAGNOSIS — F411 Generalized anxiety disorder: Secondary | ICD-10-CM | POA: Diagnosis not present

## 2023-04-06 DIAGNOSIS — F6381 Intermittent explosive disorder: Secondary | ICD-10-CM | POA: Diagnosis not present

## 2023-04-06 DIAGNOSIS — F840 Autistic disorder: Secondary | ICD-10-CM | POA: Diagnosis not present

## 2023-04-13 DIAGNOSIS — F39 Unspecified mood [affective] disorder: Secondary | ICD-10-CM | POA: Diagnosis not present

## 2023-04-24 DIAGNOSIS — G4733 Obstructive sleep apnea (adult) (pediatric): Secondary | ICD-10-CM | POA: Diagnosis not present

## 2023-04-25 DIAGNOSIS — D2261 Melanocytic nevi of right upper limb, including shoulder: Secondary | ICD-10-CM | POA: Diagnosis not present

## 2023-04-25 DIAGNOSIS — L570 Actinic keratosis: Secondary | ICD-10-CM | POA: Diagnosis not present

## 2023-04-25 DIAGNOSIS — D485 Neoplasm of uncertain behavior of skin: Secondary | ICD-10-CM | POA: Diagnosis not present

## 2023-04-25 DIAGNOSIS — L814 Other melanin hyperpigmentation: Secondary | ICD-10-CM | POA: Diagnosis not present

## 2023-04-25 DIAGNOSIS — L821 Other seborrheic keratosis: Secondary | ICD-10-CM | POA: Diagnosis not present

## 2023-04-25 DIAGNOSIS — D225 Melanocytic nevi of trunk: Secondary | ICD-10-CM | POA: Diagnosis not present

## 2023-04-27 DIAGNOSIS — F39 Unspecified mood [affective] disorder: Secondary | ICD-10-CM | POA: Diagnosis not present

## 2023-04-27 DIAGNOSIS — G4733 Obstructive sleep apnea (adult) (pediatric): Secondary | ICD-10-CM | POA: Diagnosis not present

## 2023-05-11 DIAGNOSIS — F39 Unspecified mood [affective] disorder: Secondary | ICD-10-CM | POA: Diagnosis not present

## 2023-05-15 DIAGNOSIS — N1831 Chronic kidney disease, stage 3a: Secondary | ICD-10-CM | POA: Diagnosis not present

## 2023-05-24 DIAGNOSIS — G4733 Obstructive sleep apnea (adult) (pediatric): Secondary | ICD-10-CM | POA: Diagnosis not present

## 2023-05-25 DIAGNOSIS — F39 Unspecified mood [affective] disorder: Secondary | ICD-10-CM | POA: Diagnosis not present

## 2023-06-06 DIAGNOSIS — F39 Unspecified mood [affective] disorder: Secondary | ICD-10-CM | POA: Diagnosis not present

## 2023-06-08 DIAGNOSIS — N1831 Chronic kidney disease, stage 3a: Secondary | ICD-10-CM | POA: Diagnosis not present

## 2023-06-12 DIAGNOSIS — I10 Essential (primary) hypertension: Secondary | ICD-10-CM | POA: Diagnosis not present

## 2023-06-12 DIAGNOSIS — N1831 Chronic kidney disease, stage 3a: Secondary | ICD-10-CM | POA: Diagnosis not present

## 2023-06-12 DIAGNOSIS — N041 Nephrotic syndrome with focal and segmental glomerular lesions: Secondary | ICD-10-CM | POA: Diagnosis not present

## 2023-06-22 DIAGNOSIS — F39 Unspecified mood [affective] disorder: Secondary | ICD-10-CM | POA: Diagnosis not present

## 2023-06-24 DIAGNOSIS — G4733 Obstructive sleep apnea (adult) (pediatric): Secondary | ICD-10-CM | POA: Diagnosis not present

## 2023-07-06 DIAGNOSIS — F39 Unspecified mood [affective] disorder: Secondary | ICD-10-CM | POA: Diagnosis not present

## 2023-07-17 ENCOUNTER — Other Ambulatory Visit: Payer: Self-pay | Admitting: Cardiology

## 2023-07-17 DIAGNOSIS — N1831 Chronic kidney disease, stage 3a: Secondary | ICD-10-CM | POA: Diagnosis not present

## 2023-07-20 DIAGNOSIS — F39 Unspecified mood [affective] disorder: Secondary | ICD-10-CM | POA: Diagnosis not present

## 2023-07-29 DIAGNOSIS — G4733 Obstructive sleep apnea (adult) (pediatric): Secondary | ICD-10-CM | POA: Diagnosis not present

## 2023-08-03 DIAGNOSIS — F39 Unspecified mood [affective] disorder: Secondary | ICD-10-CM | POA: Diagnosis not present

## 2023-08-07 ENCOUNTER — Telehealth: Payer: Self-pay

## 2023-08-07 NOTE — Telephone Encounter (Signed)
 Pt called and LVM wanting to know if he was still on the schedule for 08/08/23. I called pt back and had to LVM. I informed him that back in Oct he was called and was also sent a letter letting him know that the provider was going to be out and he will be needing to r/s his appt.

## 2023-08-07 NOTE — Telephone Encounter (Signed)
 Patient trying to call and schedule a follow up appt with Amy but unsuccessful at getting through to the main line, please call the patient back to schedule. Thanks!!

## 2023-08-07 NOTE — Telephone Encounter (Signed)
 1st attempt lvm mychart msg sent

## 2023-08-08 ENCOUNTER — Ambulatory Visit: Payer: BC Managed Care – PPO | Admitting: Family Medicine

## 2023-08-14 DIAGNOSIS — N1831 Chronic kidney disease, stage 3a: Secondary | ICD-10-CM | POA: Diagnosis not present

## 2023-08-17 DIAGNOSIS — F39 Unspecified mood [affective] disorder: Secondary | ICD-10-CM | POA: Diagnosis not present

## 2023-08-21 NOTE — Progress Notes (Unsigned)
 PATIENT: Darren Hamilton DOB: 1961-02-26  REASON FOR VISIT: follow up HISTORY FROM: patient  No chief complaint on file.    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     08/01/2022 ALL:  Noor returns for follow up for OSA on CPAP. He continues to do well on therapy. He continues to do well on therapy. He denies concerns with machine or supplies. He uses memory foam FFM. He denies concerns of leak. He has significant facial hair. He was diagnosed with Covid in 05/2022 for the first time. He experienced for trouble with fatigue than respiratory symptoms. He reports feeling back to baseline within 5 weeks. He is no longer on tacrolimus, pegloticase or torsemide. Focal glomerulosclerosis (FSGS) is in remission. Labs have improved.     07/26/2021 ALL: Izrael returns for follow up for OSA on CPAP. He is doing well on therapy. He is using his machine every night. He does continue to note increased leak. He has tried numerous mask refitting but does not feel it helped. He is most comfortable with FFM. He wakes feeling refreshed. He feels that he sleeps well. He denies concerns with CPAP or supplies.     03/10/2020 ALL:  Bijon Mineer is a 63 y.o. male here today for follow up for OSA on CPAP. He reports that he has done very well with CPAP therapy over the past 6 months.  He notes significant improvement in sleep quality as well as daytime energy levels with consistent usage of CPAP.  He did try using a chinstrap but states that this did not work out well for him.  He does not feel that mouth breathing continues to be a concern.  He does have a significant amount of facial hair.  He feels that his foam FFM slips off the top of his nose.  He was previously using a silicone fullface mask and is curious if that may work better for him.  Otherwise, he is doing very well.  Compliance report dated 02/08/2020 through 03/08/2020 reveals that he  used CPAP 30 of the past 30 days for compliance of 100%.  He used CPAP greater than 4 hours all 30 days.  Average usage was 7 hours and 34 minutes.  Residual AHI was elevated at 6.0 on 7 to 16 cm of water pressure and an EPR of 3.  A significant leak remains in the 95th percentile of 48.5 L/min.  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  The patient is unaccompanied today. I saw him on 06/11/2019, at which time he was fully compliant with his AutoPap, he felt like he was sleeping better.  He had an interim issue with his kidneys, was diagnosed with focal glomerulosclerosis.  He had some issues with the nasal mass causing almost like an acne-like outbreak around the nose.  He had lost weight.  I suggested we proceed with an overnight pulse oximetry test to make sure his oxygen saturations were adequate while in was on his AutoPap therapy.  He had a overnight pulse oximetry test on 06/14/2019 which showed an average oxygen saturation of 94%, nadir was 84%, time below or at 88% saturation while on room air on AutoPap was 43 seconds.  We attempted to call him with his test results.  He was advised to continue with AutoPap therapy.   Today, 09/09/2019: I reviewed his AutoPap compliance data from 08/10/2019 through 09/08/2019, which is a total of 30 days, during which time he used his machine every night with percent use days greater than 4 hours at 100%, indicating superb compliance with an average usage of 6 hours and 55 minutes, residual AHI borderline at 4.9/h, average pressure for the 95th percentile at 15.8 cm, leak high with a 95th percentile at 53.1 L/min on a pressure range of 7 cm to 16 cm with EPR.  We also reviewed his overnight pulse  oximetry test results from 06/14/2019.  He has been compliant with his CPAP, continues to benefit from it but reports significant weight fluctuations, particularly with fluid weight.  He is currently on prednisone.  He was treated with a diuretic recently, follows closely with nephrology.  He keeps his beard trimmed, the full facemask is still comfortable enough with the memory foam and he is mindful about changing his supplies on a timely basis but has noticed air leaking from the mouth, he has a tendency to have his mouth open at night.  He wants to continue to work on weight loss but it is frustrating for him because of exercise intolerance, feeling fatigued, and fluid retention.  He is motivated to continue with his AutoPap.   The patient's allergies, current medications, family history, past medical history, past social history, past surgical history and problem list were reviewed and updated as appropriate.    Previously:    I first met him on 02/05/2019 at the request of his primary care physician, at which time the patient reported snoring and excessive daytime somnolence as well as witnessed apneas.  He was advised to proceed with sleep study testing.  He had a home sleep test on 02/20/2019 which showed severe obstructive sleep apnea with a total AHI of 86.4/hour and O2 nadir of 67%.  Since his insurance did not approve a laboratory attended sleep study, he was advised to proceed with AutoPap therapy at home.   I reviewed his AutoPap compliance data from 05/11/2019 through 06/09/2019 which is a total of 30 days, during which time he used his machine every night with percent use days greater than 4 hours at 100%, indicating superb compliance with an average usage of 7 hours and 42 minutes, residual AHI borderline at 4.6/h, 95th percentile of pressure at 14.8 cm with a range of 7 cm to 15 cm with EPR of 3, leak on the higher end with a 95th percentile at 21.8 L/min.      02/05/2019: (He) reports snoring,  excessive daytime sleepiness and witnessed apneas per wife's report.  I reviewed your office note from 12/25/2018. His Epworth sleepiness score is 19 out of 24, fatigue severity score is 54 out of 63.  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  His bedtime is generally around 11 but sometimes later than that.  His rise time is generally around 7 AM.  He lives with his wife and 63 year old son who has autism.  He has a TV in the bedroom but they hardly use it.  They have 2 cats in the household who do not bother them at night.  He has recently noticed lower extremity edema.  He has been referred for an echocardiogram.  He had a stress test several years ago which was benign per his report.  He has gained  over the past several years.  He has rare morning headaches, nocturia about once per average night, admits to drinking quite a bit of caffeine in the form of coffee, about a 8 cup pot per day and 1 serving of tea.  He does not have any telltale symptoms of restless leg syndrome but is a restless sleeper.  His wife has commented on his loud snoring which is worse when he is on his back.  He tries to sleep on his sides.  He is not aware of any family history of sleep apnea but suspects that his mom may have had it, she was a loud snorer, she passed away recently at age 69, father passed away from complications of heart disease at age 69.   REVIEW OF SYSTEMS: Out of a complete 14 system review of symptoms, the patient complains only of the following symptoms, fatigue and all other reviewed systems are negative.  ESS: 4/24  ALLERGIES: No Known Allergies  HOME MEDICATIONS: Outpatient Medications Prior to Visit  Medication Sig Dispense Refill   acetaminophen (TYLENOL) 500 MG tablet Take 500-1,000 mg by mouth every 6 (six) hours as needed for mild pain or moderate pain.     albuterol (VENTOLIN HFA) 108 (90 Base) MCG/ACT inhaler TAKE 2 PUFFS BY MOUTH EVERY 6 HOURS AS NEEDED FOR WHEEZE OR SHORTNESS OF  BREATH 8.5 each 11   Cholecalciferol (DIALYVITE VITAMIN D 5000 PO) Take 5,000 Units by mouth daily.      colchicine 0.6 MG tablet Take 1 tablet (0.6 mg total) by mouth daily. 30 tablet 0   escitalopram (LEXAPRO) 20 MG tablet TAKE 1 TABLET (20 MG TOTAL) BY MOUTH DAILY. START 1/2 TAB DAILY, MAY INCREASE TO 1 DAILY AFTER 1 WEEK 90 tablet 3   ezetimibe (ZETIA) 10 MG tablet Take 1 tablet (10 mg total) by mouth daily. 90 tablet 1   FARXIGA 10 MG TABS tablet Take 10 mg by mouth daily.     febuxostat (ULORIC) 40 MG tablet Take 40 mg by mouth daily.     gemfibrozil (LOPID) 600 MG tablet Take 1 tablet (600 mg total) by mouth 2 (two) times daily with a meal. 180 tablet 3   levocetirizine (XYZAL) 5 MG tablet daily.     lisinopril (ZESTRIL) 10 MG tablet TAKE 1 TABLET BY MOUTH EVERY DAY 90 tablet 3   montelukast (SINGULAIR) 10 MG tablet Take 10 mg by mouth at bedtime.     QVAR REDIHALER 80 MCG/ACT inhaler 1 puff 2 (two) times daily.     rosuvastatin (CRESTOR) 40 MG tablet Take 1 tablet (40 mg total) by mouth daily. 90 tablet 3   torsemide (DEMADEX) 100 MG tablet Take 100 mg by mouth as needed.     No facility-administered medications prior to visit.    PAST MEDICAL HISTORY: Past Medical History:  Diagnosis Date   Anxiety    Asthma    Depression    Hypertension    Nephrotic syndrome     PAST SURGICAL HISTORY: Past Surgical History:  Procedure Laterality Date   RENAL BIOPSY      FAMILY HISTORY: Family History  Problem Relation Age of Onset   Heart failure Mother    Heart failure Father    Celiac disease Sister    Diabetes Maternal Grandmother    Heart disease Maternal Grandmother    Kidney failure Maternal Grandfather    Lung cancer Paternal Grandmother    Cancer Paternal Grandmother    Heart disease Paternal Grandfather  Celiac disease Other    Prostate cancer Neg Hx    Colon cancer Neg Hx     SOCIAL HISTORY: Social History   Socioeconomic History   Marital status: Married     Spouse name: Not on file   Number of children: Not on file   Years of education: Not on file   Highest education level: Not on file  Occupational History   Not on file  Tobacco Use   Smoking status: Never   Smokeless tobacco: Never  Vaping Use   Vaping status: Never Used  Substance and Sexual Activity   Alcohol use: Yes    Alcohol/week: 3.0 standard drinks of alcohol    Types: 1 Glasses of wine, 1 Cans of beer, 1 Shots of liquor per week    Comment: occasionally   Drug use: No   Sexual activity: Not on file  Other Topics Concern   Not on file  Social History Narrative   Not on file   Social Drivers of Health   Financial Resource Strain: Not on file  Food Insecurity: Not on file  Transportation Needs: Not on file  Physical Activity: Not on file  Stress: Not on file  Social Connections: Not on file  Intimate Partner Violence: Not on file      PHYSICAL EXAM  There were no vitals filed for this visit.    There is no height or weight on file to calculate BMI.  Generalized: Well developed, in no acute distress  Cardiology: normal rate and rhythm, no murmur noted Respiratory: clear to auscultation bilaterally  Neurological examination  Mentation: Alert oriented to time, place, history taking. Follows all commands speech and language fluent Cranial nerve II-XII: Pupils were equal round reactive to light. Extraocular movements were full, visual field were full  Motor: The motor testing reveals 5 over 5 strength of all 4 extremities. Good symmetric motor tone is noted throughout.  Gait and station: Gait is wide but stable    DIAGNOSTIC DATA (LABS, IMAGING, TESTING) - I reviewed patient records, labs, notes, testing and imaging myself where available.      No data to display           Lab Results  Component Value Date   WBC 11.4 (H) 11/16/2019   HGB 11.0 (L) 11/16/2019   HCT 33.5 (L) 11/16/2019   MCV 93.3 11/16/2019   PLT 412 (H) 11/16/2019       Component Value Date/Time   NA 139 11/19/2019 0904   NA 138 04/09/2019 0822   K 3.9 11/19/2019 0904   CL 100 11/19/2019 0904   CO2 29 11/19/2019 0904   GLUCOSE 105 (H) 11/19/2019 0904   BUN 49 (H) 11/19/2019 0904   BUN 20 04/09/2019 0822   CREATININE 1.75 (H) 11/19/2019 0904   CREATININE 1.05 12/25/2018 1700   CALCIUM 9.0 11/19/2019 0904   PROT 5.9 (L) 11/08/2019 0503   PROT 4.3 (LL) 04/01/2019 1246   ALBUMIN 2.1 (L) 11/19/2019 0904   ALBUMIN 2.0 (L) 04/01/2019 1246   AST 13 (L) 11/08/2019 0503   ALT 14 11/08/2019 0503   ALKPHOS 49 11/08/2019 0503   BILITOT 0.6 11/08/2019 0503   BILITOT <0.2 04/01/2019 1246   GFRNONAA 43 (L) 11/19/2019 0904   GFRAA 49 (L) 11/19/2019 0904   Lab Results  Component Value Date   CHOL 159 12/09/2022   HDL 52 12/09/2022   LDLCALC 94 12/09/2022   TRIG 64 12/09/2022   CHOLHDL 3.1 12/09/2022   Lab  Results  Component Value Date   HGBA1C 6.0 (H) 03/31/2020   No results found for: "VITAMINB12" Lab Results  Component Value Date   TSH 4.64 (H) 03/31/2020       ASSESSMENT AND PLAN 63 y.o. year old male  has a past medical history of Anxiety, Asthma, Depression, Hypertension, and Nephrotic syndrome. here with   No diagnosis found.    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  Compliance report reveals daily and 4-hour compliance of 100%.  I have commended him on his excellent compliance and encouraged him to continue using CPAP nightly and for greater than 4 hours each night.  Residual AHI is 3.8 events per hour. Baseline AHI 86.4/hr. I suspect that his facial hair is most likely contributing to leak. He has tried multiple mask and feels that current FFM works best for him. He will continue to monitor for leak at home. Healthy lifestyle habits encouraged.  He will follow-up with [place] in 1 year, sooner if needed.  He verbalizes understanding and agreement with this plan.   No orders of the defined types were placed in this encounter.     No orders of the defined types were placed in this encounter.    Shawnie Dapper, FNP-C 08/21/2023, 1:26 PM [facility] [address] [place], [place] [address] [phone]

## 2023-08-23 ENCOUNTER — Encounter: Payer: Self-pay | Admitting: Family Medicine

## 2023-08-23 ENCOUNTER — Ambulatory Visit: Admitting: Family Medicine

## 2023-08-23 VITALS — BP 109/67 | HR 72 | Ht 68.0 in | Wt 277.0 lb

## 2023-08-23 DIAGNOSIS — G4733 Obstructive sleep apnea (adult) (pediatric): Secondary | ICD-10-CM

## 2023-08-23 NOTE — Patient Instructions (Signed)
 Please continue using your CPAP regularly. While your insurance requires that you use CPAP at least 4 hours each night on 70% of the nights, I recommend, that you not skip any nights and use it throughout the night if you can. Getting used to CPAP and staying with the treatment long term does take time and patience and discipline. Untreated obstructive sleep apnea when it is moderate to severe can have an adverse impact on cardiovascular health and raise her risk for heart disease, arrhythmias, hypertension, congestive heart failure, stroke and diabetes. Untreated obstructive sleep apnea causes sleep disruption, nonrestorative sleep, and sleep deprivation. This can have an impact on your day to day functioning and cause daytime sleepiness and impairment of cognitive function, memory loss, mood disturbance, and problems focussing. Using CPAP regularly can improve these symptoms.  We will update supply orders, today.   Follow up in December and we will start process to get you a new machine

## 2023-08-31 DIAGNOSIS — F39 Unspecified mood [affective] disorder: Secondary | ICD-10-CM | POA: Diagnosis not present

## 2023-09-06 NOTE — Telephone Encounter (Signed)
 LVM for pt to call.

## 2023-09-08 DIAGNOSIS — N041 Nephrotic syndrome with focal and segmental glomerular lesions: Secondary | ICD-10-CM | POA: Diagnosis not present

## 2023-09-08 DIAGNOSIS — N1831 Chronic kidney disease, stage 3a: Secondary | ICD-10-CM | POA: Diagnosis not present

## 2023-09-08 DIAGNOSIS — I10 Essential (primary) hypertension: Secondary | ICD-10-CM | POA: Diagnosis not present

## 2023-09-11 DIAGNOSIS — L409 Psoriasis, unspecified: Secondary | ICD-10-CM | POA: Diagnosis not present

## 2023-09-11 DIAGNOSIS — M1A09X0 Idiopathic chronic gout, multiple sites, without tophus (tophi): Secondary | ICD-10-CM | POA: Diagnosis not present

## 2023-09-11 DIAGNOSIS — Z111 Encounter for screening for respiratory tuberculosis: Secondary | ICD-10-CM | POA: Diagnosis not present

## 2023-09-11 DIAGNOSIS — M791 Myalgia, unspecified site: Secondary | ICD-10-CM | POA: Diagnosis not present

## 2023-09-11 DIAGNOSIS — M2559 Pain in other specified joint: Secondary | ICD-10-CM | POA: Diagnosis not present

## 2023-09-11 DIAGNOSIS — L4059 Other psoriatic arthropathy: Secondary | ICD-10-CM | POA: Diagnosis not present

## 2023-09-11 DIAGNOSIS — R5383 Other fatigue: Secondary | ICD-10-CM | POA: Diagnosis not present

## 2023-09-13 DIAGNOSIS — F39 Unspecified mood [affective] disorder: Secondary | ICD-10-CM | POA: Diagnosis not present

## 2023-09-14 DIAGNOSIS — N1831 Chronic kidney disease, stage 3a: Secondary | ICD-10-CM | POA: Diagnosis not present

## 2023-10-04 DIAGNOSIS — F39 Unspecified mood [affective] disorder: Secondary | ICD-10-CM | POA: Diagnosis not present

## 2023-10-19 DIAGNOSIS — F39 Unspecified mood [affective] disorder: Secondary | ICD-10-CM | POA: Diagnosis not present

## 2023-10-26 ENCOUNTER — Encounter: Payer: Self-pay | Admitting: Cardiology

## 2023-11-02 ENCOUNTER — Other Ambulatory Visit: Payer: Self-pay

## 2023-11-02 ENCOUNTER — Other Ambulatory Visit (HOSPITAL_BASED_OUTPATIENT_CLINIC_OR_DEPARTMENT_OTHER): Payer: Self-pay

## 2023-11-02 DIAGNOSIS — N1831 Chronic kidney disease, stage 3a: Secondary | ICD-10-CM | POA: Diagnosis not present

## 2023-11-02 DIAGNOSIS — F39 Unspecified mood [affective] disorder: Secondary | ICD-10-CM | POA: Diagnosis not present

## 2023-11-02 MED ORDER — OTEZLA 30 MG PO TABS
30.0000 mg | ORAL_TABLET | Freq: Two times a day (BID) | ORAL | 5 refills | Status: AC
Start: 2023-11-02 — End: ?
  Filled 2023-11-02 – 2023-11-21 (×2): qty 60, 30d supply, fill #0

## 2023-11-06 ENCOUNTER — Other Ambulatory Visit (HOSPITAL_BASED_OUTPATIENT_CLINIC_OR_DEPARTMENT_OTHER): Payer: Self-pay

## 2023-11-06 DIAGNOSIS — G4733 Obstructive sleep apnea (adult) (pediatric): Secondary | ICD-10-CM | POA: Diagnosis not present

## 2023-11-08 ENCOUNTER — Other Ambulatory Visit (HOSPITAL_BASED_OUTPATIENT_CLINIC_OR_DEPARTMENT_OTHER): Payer: Self-pay

## 2023-11-15 ENCOUNTER — Other Ambulatory Visit: Payer: Self-pay

## 2023-11-16 DIAGNOSIS — F39 Unspecified mood [affective] disorder: Secondary | ICD-10-CM | POA: Diagnosis not present

## 2023-11-18 ENCOUNTER — Other Ambulatory Visit (HOSPITAL_BASED_OUTPATIENT_CLINIC_OR_DEPARTMENT_OTHER): Payer: Self-pay

## 2023-11-20 ENCOUNTER — Other Ambulatory Visit (HOSPITAL_BASED_OUTPATIENT_CLINIC_OR_DEPARTMENT_OTHER): Payer: Self-pay

## 2023-11-21 ENCOUNTER — Other Ambulatory Visit (HOSPITAL_BASED_OUTPATIENT_CLINIC_OR_DEPARTMENT_OTHER): Payer: Self-pay

## 2023-11-21 ENCOUNTER — Other Ambulatory Visit: Payer: Self-pay | Admitting: Pharmacy Technician

## 2023-11-21 ENCOUNTER — Other Ambulatory Visit (HOSPITAL_COMMUNITY): Payer: Self-pay

## 2023-11-21 ENCOUNTER — Other Ambulatory Visit: Payer: Self-pay

## 2023-11-21 MED ORDER — DAPAGLIFLOZIN PROPANEDIOL 10 MG PO TABS
10.0000 mg | ORAL_TABLET | Freq: Every day | ORAL | 6 refills | Status: AC
  Filled 2023-11-21 – 2023-11-22 (×2): qty 30, 30d supply, fill #0
  Filled 2023-12-18 (×2): qty 30, 30d supply, fill #1
  Filled 2024-01-17: qty 30, 30d supply, fill #2
  Filled 2024-06-10: qty 30, 30d supply, fill #3

## 2023-11-22 ENCOUNTER — Other Ambulatory Visit (HOSPITAL_BASED_OUTPATIENT_CLINIC_OR_DEPARTMENT_OTHER): Payer: Self-pay

## 2023-11-22 ENCOUNTER — Other Ambulatory Visit: Payer: Self-pay

## 2023-11-22 ENCOUNTER — Other Ambulatory Visit (HOSPITAL_COMMUNITY): Payer: Self-pay

## 2023-11-22 ENCOUNTER — Other Ambulatory Visit: Payer: Self-pay | Admitting: Pharmacist

## 2023-11-22 ENCOUNTER — Encounter (HOSPITAL_COMMUNITY): Payer: Self-pay

## 2023-11-22 ENCOUNTER — Ambulatory Visit: Attending: Family Medicine | Admitting: Pharmacist

## 2023-11-22 ENCOUNTER — Telehealth: Payer: Self-pay

## 2023-11-22 DIAGNOSIS — Z7189 Other specified counseling: Secondary | ICD-10-CM

## 2023-11-22 MED ORDER — OTEZLA 30 MG PO TABS
30.0000 mg | ORAL_TABLET | Freq: Two times a day (BID) | ORAL | 5 refills | Status: DC
  Filled 2023-11-22: qty 60, 30d supply, fill #0
  Filled 2023-12-14: qty 60, 30d supply, fill #1
  Filled 2024-01-12: qty 60, 30d supply, fill #2
  Filled 2024-02-07 – 2024-02-12 (×2): qty 60, 30d supply, fill #3
  Filled 2024-03-18 (×2): qty 60, 30d supply, fill #4
  Filled 2024-04-11: qty 60, 30d supply, fill #5

## 2023-11-22 NOTE — Telephone Encounter (Signed)
 Pharmacy Patient Advocate Encounter  Insurance verification completed.   The patient is insured through [facility]   Ran test claim for Otezla . Co-pay is $0. Patient has copay card-  [id number] [phone] [id number]  This test claim was processed through [facility]- copay amounts may vary at other pharmacies due to pharmacy/plan contracts, or as the patient moves through the different stages of their insurance plan.

## 2023-11-22 NOTE — Progress Notes (Signed)
 Please see OV from 11/22/23 for complete documentation.  Marene Shape, PharmD, Becky Bowels, CPP Clinical Pharmacist [facility] [phone]

## 2023-11-22 NOTE — Progress Notes (Signed)
 Specialty Pharmacy Initial Fill Coordination Note  Darren Hamilton is a 63 y.o. male contacted today regarding initial fill of specialty medication(s) Apremilast  (Otezla )   Patient requested Delivery   Delivery date: 11/23/23   Verified address: [address]   Medication will be filled on 6/18.   Patient is aware of $0 copayment.

## 2023-11-22 NOTE — Progress Notes (Signed)
  S: Patient presents for review of their specialty medication therapy.  Patient is currently taking Otezla  for psoriasis. Patient is managed by Dr. Ebbie Goldmann for this.   Adherence: started ~ 1 week ago   Efficacy: too early to tell results.  Dosing:  Active psoriatic arthritis or plaque psoriasis (moderate to severe): Oral: Initial: 10 mg in the morning. Titrate upward by additional 10 mg per day on days 2 to 5 as follows: Day 2: 10 mg twice daily; Day 3: 10 mg in the morning and 20 mg in the evening; Day 4: 20 mg twice daily; Day 5: 20 mg in the morning and 30 mg in the evening. Maintenance dose: 30 mg twice daily starting on day 6  CrCl <30 mL/minute: Initial: 10 mg in the morning on days 1 to 3; titrate using morning doses only (skip evening doses) to 20 mg on days 4 and 5. Maintenance dose: 30 mg once daily in the morning starting on day 6.   Current adverse effects: Headache: none GI upset: none Weight loss: none Neuropsychiatric effects: none  O:     Lab Results  Component Value Date   WBC 11.4 (H) 11/16/2019   HGB 11.0 (L) 11/16/2019   HCT 33.5 (L) 11/16/2019   MCV 93.3 11/16/2019   PLT 412 (H) 11/16/2019      Chemistry      Component Value Date/Time   NA 139 11/19/2019 0904   NA 138 04/09/2019 0822   K 3.9 11/19/2019 0904   CL 100 11/19/2019 0904   CO2 29 11/19/2019 0904   BUN 49 (H) 11/19/2019 0904   BUN 20 04/09/2019 0822   CREATININE 1.75 (H) 11/19/2019 0904   CREATININE 1.05 12/25/2018 1700      Component Value Date/Time   CALCIUM  9.0 11/19/2019 0904   ALKPHOS 49 11/08/2019 0503   AST 13 (L) 11/08/2019 0503   ALT 14 11/08/2019 0503   BILITOT 0.6 11/08/2019 0503   BILITOT <0.2 04/01/2019 1246       A/P: 1. Medication review: patient is currently on Otezla  for psoriasis and is tolerating it well so far. Reviewed the medication including the following: apremilast  inhibits phosphodiesterase 4 (PDE4) specific for cyclic adenosine monophosphate (cAMP)  which results in increased intracellular cAMP levels and regulation of numerous inflammatory mediators (eg, decreased expression of nitric oxide synthase, TNF-alpha, and interleukin [IL]-23, as well as increased IL-10. Patient educated on purpose, proper use and potential adverse effects of Otezla . Possible adverse effects include weight loss, GI upset, headache, and mood changes. Renal function should be routinely monitored. Administer without regard to food. Do not crush, chew, or split tablets. No recommendations for any changes at this time.  Marene Shape, PharmD, Becky Bowels, CPP Clinical Pharmacist [facility] [phone]

## 2023-11-23 ENCOUNTER — Other Ambulatory Visit: Payer: Self-pay

## 2023-11-23 ENCOUNTER — Other Ambulatory Visit (HOSPITAL_COMMUNITY): Payer: Self-pay

## 2023-11-26 IMAGING — CT CT CARDIAC CORONARY ARTERY CALCIUM SCORE
3 series · 14 of 20 positions shown, 16 images · non-contrast
Comparison: None Available.
COMPARISON: None Available.

Addendum:
CLINICAL DATA: This over-read does not include interpretation of
cardiac or coronary anatomy or pathology. The coronary calcium score
interpretation by the cardiologist is attached.
CLINICAL DATA: Risk stratification

EXAM:
Coronary Calcium Score
TECHNIQUE: The patient was scanned on a Siemens Somatom 64 slice scanner. Axial
non-contrast 3 mm slices were carried out through the heart. The
data set was analyzed on a dedicated work station and scored using
the Agatson method.

[Series 2: cascseq 2.0 sa36 70% (id) · axial · 0.48mm/px · z∈[-245,-165]mm · 4 of 68 slices shown]
[im 14/68  vessel]
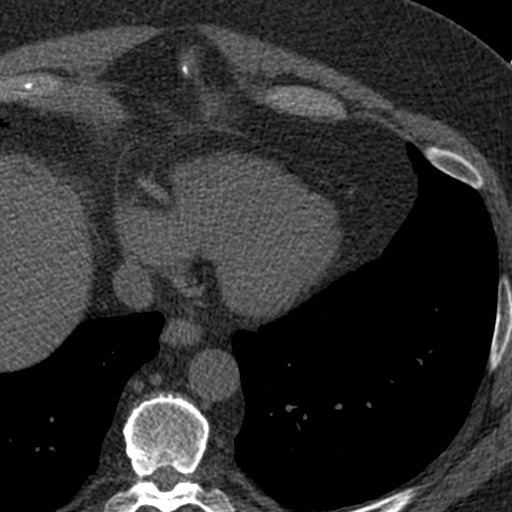
[im 27/68  vessel]
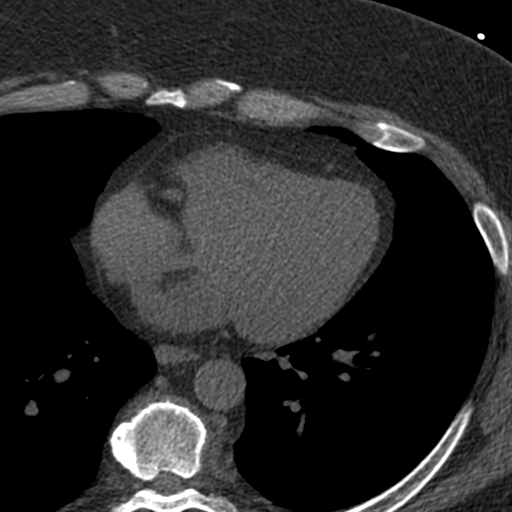
[im 41/68  vessel]
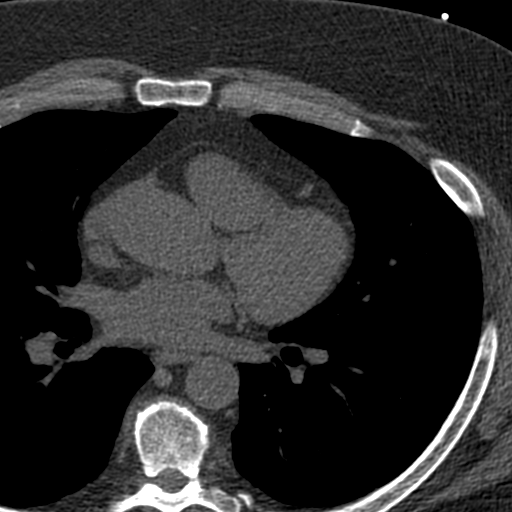
[im 54/68  vessel]
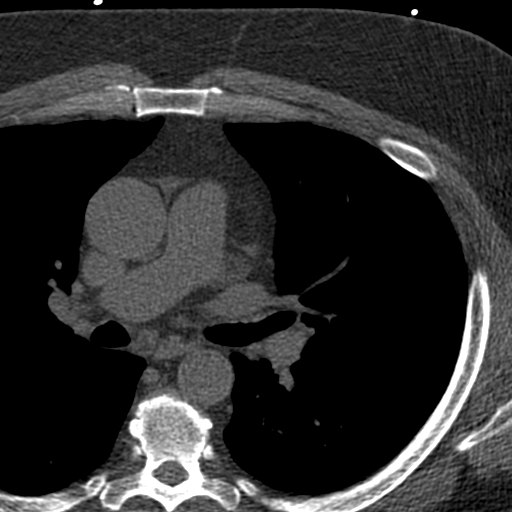

[Series 3: cascseq 2.0 bf37 st · axial · 0.78mm/px · z∈[-249,-161]mm · 5 of 68 slices shown, 7 images]
[im 12/68  vessel]
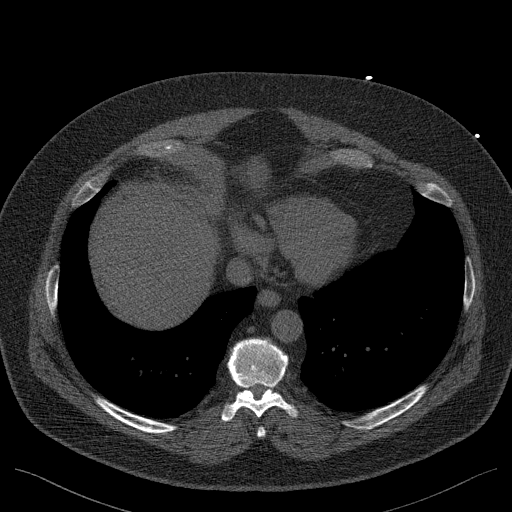
[im 12/68  lung]
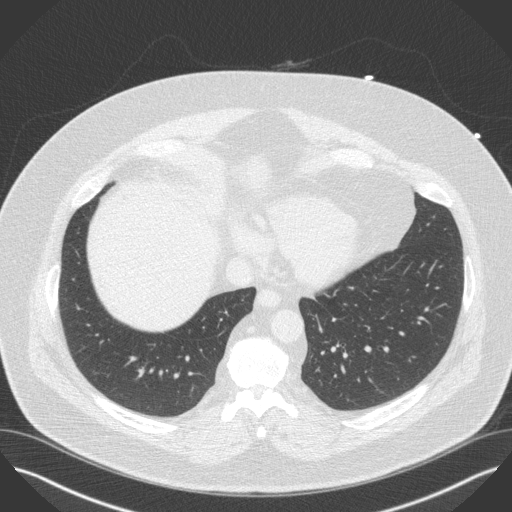
[im 23/68  vessel]
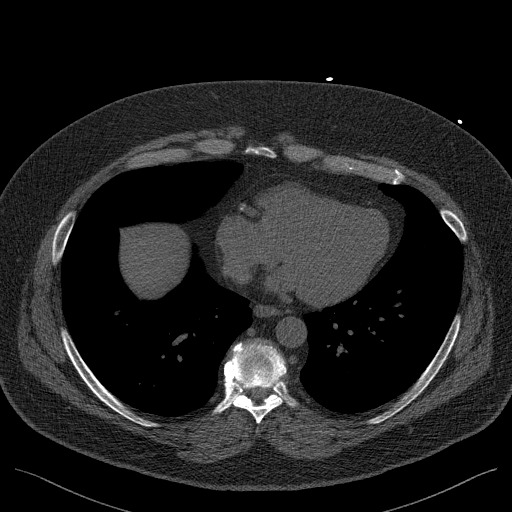
[im 34/68  vessel]
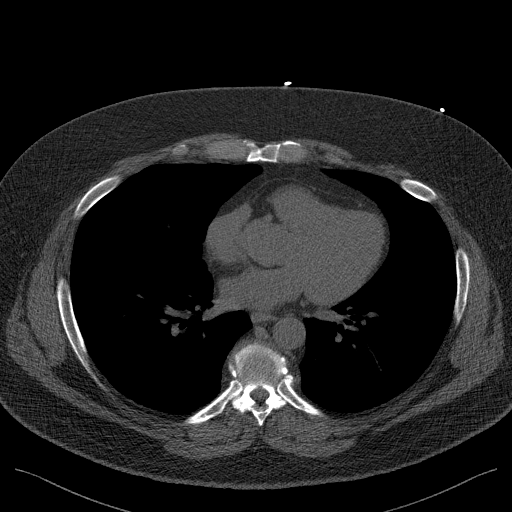
[im 45/68  vessel]
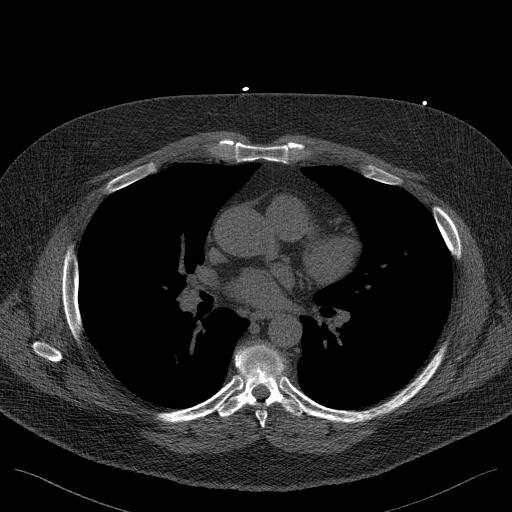
[im 56/68  vessel]
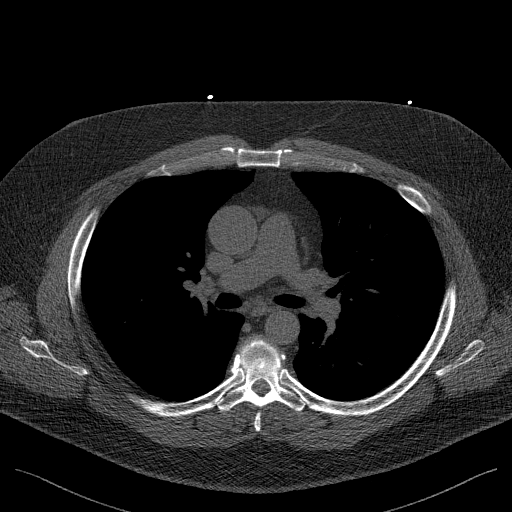
[im 56/68  lung]
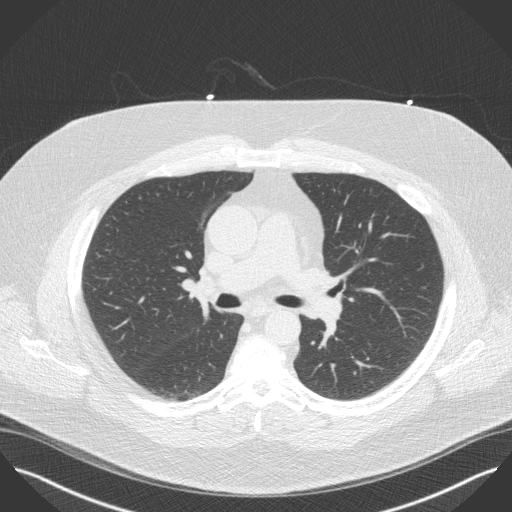

[Series 4: cascseq 2.0 br59 lung · axial · 0.78mm/px · z∈[-249,-161]mm · 5 of 68 slices shown]
[im 12/68  lung]
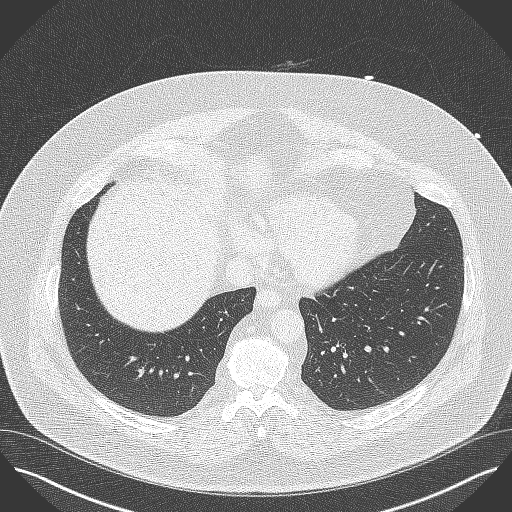
[im 23/68  lung]
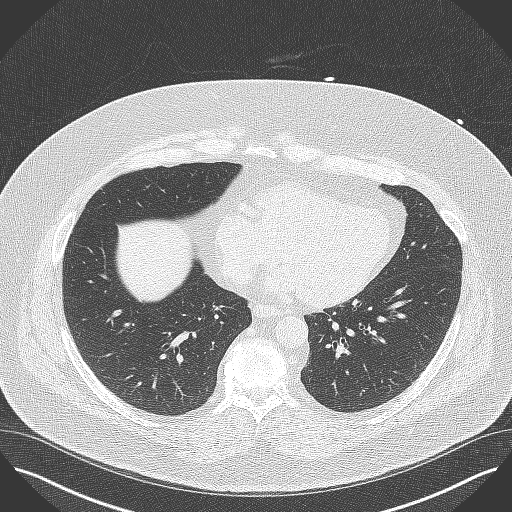
[im 34/68  lung]
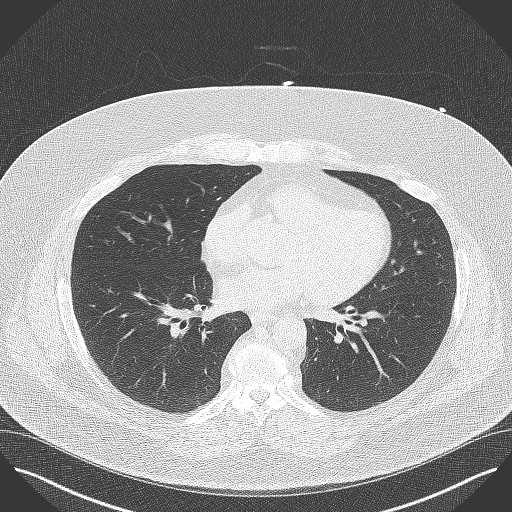
[im 45/68  lung]
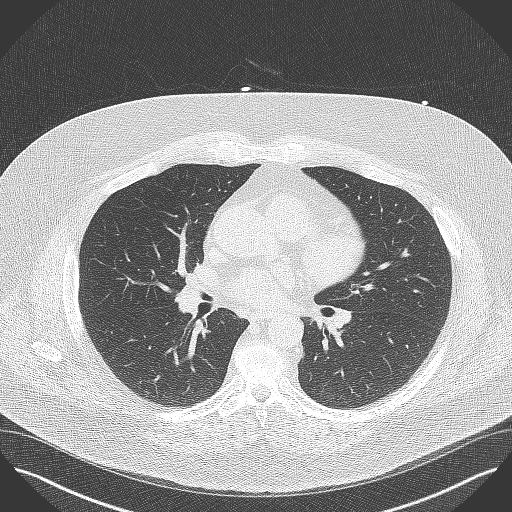
[im 56/68  lung]
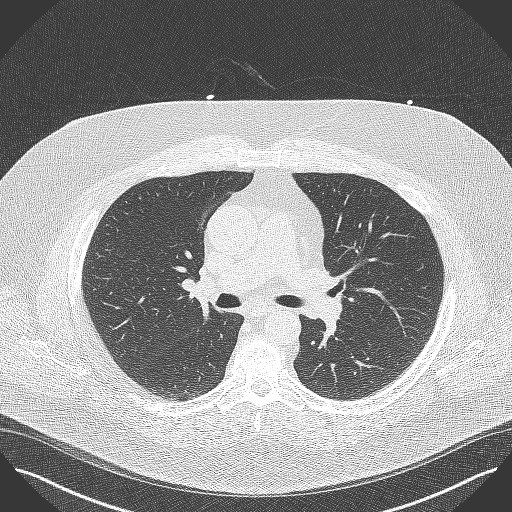

[14 of 20 positions shown; findings below may reference images not displayed]

FINDINGS: 3 mm right middle lobe pulmonary nodule (axial image 37 of series
4). Within the visualized portions of the thorax there are no other
larger more suspicious appearing pulmonary nodules or masses, there
is no acute consolidative airspace disease, no pleural effusions, no
pneumothorax and no lymphadenopathy. Visualized portions of the
upper abdomen are unremarkable. There are no aggressive appearing
lytic or blastic lesions noted in the visualized portions of the
skeleton.
IMPRESSION: 1. 3 mm right middle lobe pulmonary nodule, nonspecific, but
statistically likely benign. No follow-up needed if patient is
low-risk.This recommendation follows the consensus statement:
Guidelines for Management of Incidental Pulmonary Nodules Detected
FINDINGS: Non-cardiac: See separate report from [REDACTED].

Ascending aorta: Mildly dilated ascending thoracic aorta 3.9 cm

Pericardium: Normal

Coronary arteries: Calcium noted in RCA/LAD Score 102 which is 73 [REDACTED]
percentile for age/sex

LM: 0

LAD:

LCX: 0

RCA:

Total:  102
IMPRESSION: Coronary calcium score of 102. This was 73 [REDACTED] percentile for age and
sex matched control.

Yemi Finkelstein

*** End of Addendum ***
FINDINGS: 3 mm right middle lobe pulmonary nodule (axial image 37 of series
4). Within the visualized portions of the thorax there are no other
larger more suspicious appearing pulmonary nodules or masses, there
is no acute consolidative airspace disease, no pleural effusions, no
pneumothorax and no lymphadenopathy. Visualized portions of the
upper abdomen are unremarkable. There are no aggressive appearing
lytic or blastic lesions noted in the visualized portions of the
skeleton.
IMPRESSION: 1. 3 mm right middle lobe pulmonary nodule, nonspecific, but
statistically likely benign. No follow-up needed if patient is
low-risk.This recommendation follows the consensus statement:
Guidelines for Management of Incidental Pulmonary Nodules Detected

## 2023-11-30 DIAGNOSIS — F39 Unspecified mood [affective] disorder: Secondary | ICD-10-CM | POA: Diagnosis not present

## 2023-12-04 DIAGNOSIS — N1831 Chronic kidney disease, stage 3a: Secondary | ICD-10-CM | POA: Diagnosis not present

## 2023-12-07 ENCOUNTER — Other Ambulatory Visit: Payer: Self-pay

## 2023-12-07 DIAGNOSIS — N041 Nephrotic syndrome with focal and segmental glomerular lesions: Secondary | ICD-10-CM | POA: Diagnosis not present

## 2023-12-07 DIAGNOSIS — N1831 Chronic kidney disease, stage 3a: Secondary | ICD-10-CM | POA: Diagnosis not present

## 2023-12-07 DIAGNOSIS — I10 Essential (primary) hypertension: Secondary | ICD-10-CM | POA: Diagnosis not present

## 2023-12-11 ENCOUNTER — Other Ambulatory Visit (HOSPITAL_COMMUNITY): Payer: Self-pay

## 2023-12-12 ENCOUNTER — Other Ambulatory Visit (HOSPITAL_COMMUNITY): Payer: Self-pay

## 2023-12-12 ENCOUNTER — Encounter (HOSPITAL_COMMUNITY): Payer: Self-pay

## 2023-12-14 ENCOUNTER — Other Ambulatory Visit: Payer: Self-pay

## 2023-12-14 DIAGNOSIS — L409 Psoriasis, unspecified: Secondary | ICD-10-CM | POA: Diagnosis not present

## 2023-12-14 DIAGNOSIS — M1A09X0 Idiopathic chronic gout, multiple sites, without tophus (tophi): Secondary | ICD-10-CM | POA: Diagnosis not present

## 2023-12-14 DIAGNOSIS — M791 Myalgia, unspecified site: Secondary | ICD-10-CM | POA: Diagnosis not present

## 2023-12-14 DIAGNOSIS — Z6841 Body Mass Index (BMI) 40.0 and over, adult: Secondary | ICD-10-CM | POA: Diagnosis not present

## 2023-12-14 DIAGNOSIS — L4059 Other psoriatic arthropathy: Secondary | ICD-10-CM | POA: Diagnosis not present

## 2023-12-14 DIAGNOSIS — N051 Unspecified nephritic syndrome with focal and segmental glomerular lesions: Secondary | ICD-10-CM | POA: Diagnosis not present

## 2023-12-14 DIAGNOSIS — Z111 Encounter for screening for respiratory tuberculosis: Secondary | ICD-10-CM | POA: Diagnosis not present

## 2023-12-15 ENCOUNTER — Other Ambulatory Visit: Payer: Self-pay

## 2023-12-15 NOTE — Progress Notes (Signed)
 Specialty Pharmacy Refill Coordination Note  Darren Hamilton is a 63 y.o. male contacted today regarding refills of specialty medication(s) Apremilast  (Otezla )   Patient requested Delivery   Delivery date: 12/19/23   Verified address: [address]   Medication will be filled on 12/18/23.

## 2023-12-18 ENCOUNTER — Other Ambulatory Visit (HOSPITAL_COMMUNITY): Payer: Self-pay

## 2023-12-18 ENCOUNTER — Other Ambulatory Visit: Payer: Self-pay

## 2023-12-18 MED ORDER — ESCITALOPRAM OXALATE 20 MG PO TABS
20.0000 mg | ORAL_TABLET | Freq: Every day | ORAL | 3 refills | Status: AC
  Filled 2023-12-18 (×2): qty 90, 90d supply, fill #0

## 2023-12-21 DIAGNOSIS — F39 Unspecified mood [affective] disorder: Secondary | ICD-10-CM | POA: Diagnosis not present

## 2023-12-27 ENCOUNTER — Other Ambulatory Visit: Payer: Self-pay

## 2023-12-27 ENCOUNTER — Other Ambulatory Visit (HOSPITAL_COMMUNITY): Payer: Self-pay

## 2023-12-27 MED ORDER — COLCHICINE 0.6 MG PO TABS
0.6000 mg | ORAL_TABLET | Freq: Every day | ORAL | 4 refills | Status: AC
  Filled 2023-12-27: qty 90, 90d supply, fill #0

## 2023-12-27 MED ORDER — ROSUVASTATIN CALCIUM 20 MG PO TABS
20.0000 mg | ORAL_TABLET | Freq: Every day | ORAL | 1 refills | Status: DC
  Filled 2023-12-27: qty 90, 90d supply, fill #0
  Filled 2024-03-19: qty 60, 60d supply, fill #1

## 2023-12-28 ENCOUNTER — Other Ambulatory Visit (HOSPITAL_COMMUNITY): Payer: Self-pay

## 2024-01-04 DIAGNOSIS — N1831 Chronic kidney disease, stage 3a: Secondary | ICD-10-CM | POA: Diagnosis not present

## 2024-01-04 DIAGNOSIS — F39 Unspecified mood [affective] disorder: Secondary | ICD-10-CM | POA: Diagnosis not present

## 2024-01-12 ENCOUNTER — Other Ambulatory Visit: Payer: Self-pay | Admitting: Pharmacy Technician

## 2024-01-12 ENCOUNTER — Other Ambulatory Visit: Payer: Self-pay

## 2024-01-12 NOTE — Progress Notes (Signed)
 Specialty Pharmacy Refill Coordination Note  Darren Hamilton is a 63 y.o. male contacted today regarding refills of specialty medication(s) Apremilast  (Otezla )   Patient requested Delivery   Delivery date: 01/16/24   Verified address: [address]  [place]   Medication will be filled on 01/15/24.

## 2024-01-15 ENCOUNTER — Other Ambulatory Visit: Payer: Self-pay

## 2024-01-17 ENCOUNTER — Other Ambulatory Visit (HOSPITAL_BASED_OUTPATIENT_CLINIC_OR_DEPARTMENT_OTHER): Payer: Self-pay

## 2024-01-18 ENCOUNTER — Other Ambulatory Visit (HOSPITAL_COMMUNITY): Payer: Self-pay

## 2024-01-18 DIAGNOSIS — F39 Unspecified mood [affective] disorder: Secondary | ICD-10-CM | POA: Diagnosis not present

## 2024-01-23 ENCOUNTER — Other Ambulatory Visit: Payer: Self-pay | Admitting: Cardiology

## 2024-02-01 DIAGNOSIS — F39 Unspecified mood [affective] disorder: Secondary | ICD-10-CM | POA: Diagnosis not present

## 2024-02-01 DIAGNOSIS — N1831 Chronic kidney disease, stage 3a: Secondary | ICD-10-CM | POA: Diagnosis not present

## 2024-02-07 ENCOUNTER — Other Ambulatory Visit: Payer: Self-pay

## 2024-02-09 ENCOUNTER — Other Ambulatory Visit: Payer: Self-pay

## 2024-02-12 ENCOUNTER — Other Ambulatory Visit: Payer: Self-pay

## 2024-02-12 ENCOUNTER — Other Ambulatory Visit (HOSPITAL_COMMUNITY): Payer: Self-pay

## 2024-02-12 ENCOUNTER — Other Ambulatory Visit: Payer: Self-pay | Admitting: Pharmacy Technician

## 2024-02-12 DIAGNOSIS — G4733 Obstructive sleep apnea (adult) (pediatric): Secondary | ICD-10-CM | POA: Diagnosis not present

## 2024-02-12 MED ORDER — EZETIMIBE 10 MG PO TABS
10.0000 mg | ORAL_TABLET | Freq: Every day | ORAL | 0 refills | Status: DC
  Filled 2024-03-13: qty 90, 90d supply, fill #0

## 2024-02-12 MED ORDER — TORSEMIDE 20 MG PO TABS
ORAL_TABLET | ORAL | 1 refills | Status: AC
  Filled 2024-02-12: qty 75, 25d supply, fill #0

## 2024-02-12 MED ORDER — DAPAGLIFLOZIN PROPANEDIOL 10 MG PO TABS
10.0000 mg | ORAL_TABLET | Freq: Every day | ORAL | 6 refills | Status: AC
  Filled 2024-02-12: qty 30, 30d supply, fill #0
  Filled 2024-03-13: qty 30, 30d supply, fill #1
  Filled 2024-04-14: qty 30, 30d supply, fill #2
  Filled 2024-04-15: qty 30, 30d supply, fill #0
  Filled 2024-05-12 – 2024-05-14 (×2): qty 30, 30d supply, fill #1
  Filled 2024-06-11: qty 90, 90d supply, fill #2
  Filled 2024-06-23: qty 30, 30d supply, fill #2

## 2024-02-12 MED ORDER — MONTELUKAST SODIUM 10 MG PO TABS
10.0000 mg | ORAL_TABLET | Freq: Every day | ORAL | 11 refills | Status: DC | PRN
  Filled 2024-02-12: qty 90, 90d supply, fill #0

## 2024-02-12 MED ORDER — LISINOPRIL 10 MG PO TABS
10.0000 mg | ORAL_TABLET | Freq: Every day | ORAL | 3 refills | Status: AC
  Filled 2024-06-23: qty 90, 90d supply, fill #0

## 2024-02-12 MED ORDER — TACROLIMUS 1 MG PO CAPS
2.0000 mg | ORAL_CAPSULE | Freq: Two times a day (BID) | ORAL | 3 refills | Status: AC
  Filled 2024-03-18: qty 360, 90d supply, fill #0

## 2024-02-12 MED FILL — Gemfibrozil Tab 600 MG: ORAL | 90 days supply | Qty: 180 | Fill #0 | Status: AC

## 2024-02-12 NOTE — Progress Notes (Signed)
 Specialty Pharmacy Refill Coordination Note  Darren Hamilton is a 63 y.o. male contacted today regarding refills of specialty medication(s) Apremilast  (Otezla )   Patient requested Delivery   Delivery date: 02/20/24   Verified address: [address]   [place] [id number]   Medication will be filled on 02/19/24.

## 2024-02-13 ENCOUNTER — Other Ambulatory Visit: Payer: Self-pay

## 2024-02-13 ENCOUNTER — Other Ambulatory Visit (HOSPITAL_COMMUNITY): Payer: Self-pay

## 2024-02-13 MED ORDER — METHOCARBAMOL 750 MG PO TABS
750.0000 mg | ORAL_TABLET | Freq: Four times a day (QID) | ORAL | 6 refills | Status: AC | PRN
  Filled 2024-02-13: qty 120, 30d supply, fill #0

## 2024-02-19 ENCOUNTER — Other Ambulatory Visit: Payer: Self-pay

## 2024-02-22 DIAGNOSIS — F39 Unspecified mood [affective] disorder: Secondary | ICD-10-CM | POA: Diagnosis not present

## 2024-02-29 ENCOUNTER — Telehealth: Payer: Self-pay | Admitting: Family Medicine

## 2024-02-29 NOTE — Telephone Encounter (Signed)
 MYC cxl

## 2024-03-06 NOTE — Progress Notes (Unsigned)
 Cardiology Office Note:    Date:  03/07/2024   ID:  Darren Hamilton, DOB 04/26/1961, MRN [id number]  PCP:  Hughie Sharper, MD  Cardiologist:  None  Electrophysiologist:  None   Referring MD: Hughie Sharper, MD   No chief complaint on file.   History of Present Illness:    Darren Hamilton is a 63 y.o. male with a hx of hypertension, hyperlipidemia, OSA, diastolic dysfunction, nephrotic syndrome who presents for follow-up.  Patient presented to the [facility] ED on 02/24/2019 with complaint of leg swelling.  Lower extremity duplex showed no evidence of DVT.  BNP was over 1100.  He was started on Lasix  20 mg daily and discharged from the ED.  He did not respond to Lasix  and this was changed to torsemide  20 mg BID as an outpatient.  TTE on 02/25/2019 showed normal LV systolic function, grade 1 diastolic dysfunction, mild LVH, normal RV size and function, no significant valvular disease, small/collapsible IVC.   He was referred to cardiology for initial visit on 04/01/2019.  Given his presentation with only mild diastolic dysfunction with significant volume overloaded, labs were checked and showed significant reduction in albumin  (2.0) with 4+ proteinuria on urinalysis.  Spot protein to creatinine ratio showed nephrotic range proteinuria.  He was referred to nephrology for further evaluation.  Underwent renal biopsy, which showed FSGS.  Calcium  score 102 on 10/18/2021 (73rd percentile).  Since last clinic visit, he reports he is doing well.  Reports FSGS has been well-controlled and his tacrolimus  is being tapered.  He is currently on torsemide  20 mg once daily.  He has lost 30 pounds since last year.  Denies any chest pain, dyspnea, or palpitations.  Has lost 30 lbs. Reports some lightheadedness but denies any syncope.  Reports his edema has resolved, he is using compression stockings.  He has not been exercising.  Wt Readings from Last 3 Encounters:  03/07/24 253 lb 9.6 oz (115 kg)  08/23/23 277 lb  (125.6 kg)  12/09/22 283 lb 12.8 oz (128.7 kg)     Past Medical History:  Diagnosis Date   Anxiety    Asthma    Depression    Hypertension    Nephrotic syndrome     Past Surgical History:  Procedure Laterality Date   RENAL BIOPSY      Current Medications: Current Meds  Medication Sig   acetaminophen  (TYLENOL ) 500 MG tablet Take 500-1,000 mg by mouth every 6 (six) hours as needed for mild pain or moderate pain.   albuterol  (VENTOLIN  HFA) 108 (90 Base) MCG/ACT inhaler TAKE 2 PUFFS BY MOUTH EVERY 6 HOURS AS NEEDED FOR WHEEZE OR SHORTNESS OF BREATH   Apremilast  (OTEZLA ) 30 MG TABS Take 1 tablet (30 mg total) by mouth 2 (two) times daily.   Cholecalciferol (DIALYVITE VITAMIN D  5000 PO) Take 5,000 Units by mouth daily.    dapagliflozin  propanediol (FARXIGA ) 10 MG TABS tablet Take 1 tablet (10 mg total) by mouth daily.   dapagliflozin  propanediol (FARXIGA ) 10 MG TABS tablet Take 1 tablet (10 mg total) by mouth daily.   escitalopram  (LEXAPRO ) 20 MG tablet Take 1 tablet (20 mg total) by mouth daily.   ezetimibe  (ZETIA ) 10 MG tablet TAKE 1 TABLET BY MOUTH EVERY DAY   ezetimibe  (ZETIA ) 10 MG tablet Take 1 tablet (10 mg total) by mouth daily.   febuxostat (ULORIC) 40 MG tablet Take 40 mg by mouth daily.   gemfibrozil  (LOPID ) 600 MG tablet Take 1 tablet (600 mg total) by  mouth 2 (two) times daily with a meal.   lisinopril  (ZESTRIL ) 10 MG tablet Take 1 tablet (10 mg total) by mouth daily.   methocarbamol  (ROBAXIN ) 750 MG tablet Take 1 tablet (750 mg total) by mouth every 6 (six) hours as needed.   QVAR  REDIHALER 80 MCG/ACT inhaler 1 puff 2 (two) times daily.   tacrolimus  (PROGRAF ) 1 MG capsule Take 2 capsules (2 mg total) by mouth 2 (two) times daily. (Patient taking differently: Take 2 mg by mouth 2 (two) times daily. Take 2mg  in the morning, take 1 mg at night)   torsemide  (DEMADEX ) 20 MG tablet Take 2 tablets (40 mg total) by mouth in the morning. May also take 1 tablet (20 mg total) daily  as needed. (Patient taking differently: Take 1 tablet by mouth in the morning. May also take 1 tablet (20 mg total) daily as needed.)     Allergies:   Patient has no known allergies.   Social History   Socioeconomic History   Marital status: Married    Spouse name: Not on file   Number of children: Not on file   Years of education: Not on file   Highest education level: Not on file  Occupational History   Not on file  Tobacco Use   Smoking status: Never   Smokeless tobacco: Never  Vaping Use   Vaping status: Never Used  Substance and Sexual Activity   Alcohol use: Yes    Alcohol/week: 3.0 standard drinks of alcohol    Types: 1 Glasses of wine, 1 Cans of beer, 1 Shots of liquor per week    Comment: occasionally   Drug use: No   Sexual activity: Not on file  Other Topics Concern   Not on file  Social History Narrative   Lives with wife    Pt works    Social Drivers of Corporate investment banker Strain: Not on Ship broker Insecurity: Not on file  Transportation Needs: Not on file  Physical Activity: Not on file  Stress: Not on file  Social Connections: Not on file     Family History: Father was diagnosed with CAD in early 78s, underwent CABG and AVR.    ROS:   Please see the history of present illness.     All other systems reviewed and are negative.  EKGs/Labs/Other Studies Reviewed:    The following studies were reviewed today:   EKG:   09/07/2021 normal sinus rhythm, rate 72, Q waves in leads III, aVF, poor R wave progression, unchanged from prior 12/09/2022: Normal sinus rhythm, rate 71, Q waves in lead III, aVF, poor R wave progression 10/2/225: Sinus rhythm, occasional PVCs, poor R wave progression, Q waves in leads III, aVF  TTE 02/25/19: 1. Left ventricular ejection fraction, by visual estimation, is 55 to 60%. The left ventricle has normal function. There is mildly increased left ventricular hypertrophy.  2. The mitral valve is normal in structure. No  evidence of mitral valve regurgitation. No evidence of mitral stenosis.  3. The aortic valve is tricuspid Aortic valve regurgitation was not visualized by color flow Doppler. Mild aortic valve sclerosis without stenosis.  4. There is mild dilatation of the ascending aorta measuring 38 mm.  5. Global right ventricle has normal systolic function.The right ventricular size is normal. No increase in right ventricular wall thickness.  6. Left atrial size was normal.  7. Right atrial size was normal.  8. Left ventricular diastolic Doppler parameters are consistent with impaired  relaxation pattern of LV diastolic filling.  9. The inferior vena cava is normal in size with greater than 50% respiratory variability, suggesting right atrial pressure of 3 mmHg. 10. The tricuspid valve is normal in structure. Tricuspid valve regurgitation was not visualized by color flow Doppler.  Recent Labs: No results found for requested labs within last 365 days.  Recent Lipid Panel    Component Value Date/Time   CHOL 159 12/09/2022 0923   TRIG 64 12/09/2022 0923   HDL 52 12/09/2022 0923   CHOLHDL 3.1 12/09/2022 0923   CHOLHDL 9.5 (H) 03/31/2020 1342   LDLCALC 94 12/09/2022 0923   LDLCALC  03/31/2020 1342     Comment:     . LDL cholesterol not calculated. Triglyceride levels greater than 400 mg/dL invalidate calculated LDL results. . Reference range: <100 . Desirable range <100 mg/dL for primary prevention;   <70 mg/dL for patients with CHD or diabetic patients  with > or = 2 CHD risk factors. SABRA LDL-C is now calculated using the Martin-Hopkins  calculation, which is a validated novel method providing  better accuracy than the Friedewald equation in the  estimation of LDL-C.  Gladis APPLETHWAITE et al. SANDREA. 7986;689(80): 2061-2068  (http://education.[employer].com/faq/FAQ164)     Physical Exam:    VS:  BP 124/78 (Patient Position: Sitting, Cuff Size: Large)   Pulse 93   Ht 5' 8 (1.727 m)   Wt 253 lb  9.6 oz (115 kg)   SpO2 98%   BMI 38.56 kg/m     Wt Readings from Last 3 Encounters:  03/07/24 253 lb 9.6 oz (115 kg)  08/23/23 277 lb (125.6 kg)  12/09/22 283 lb 12.8 oz (128.7 kg)     GEN:   in no acute distress HEENT: Normal NECK: No JVD  CARDIAC: RRR, no murmurs, rubs, gallops RESPIRATORY:  Clear to auscultation without rales, wheezing or rhonchi  ABDOMEN: Soft, non-tender, non-distended MUSCULOSKELETAL:  No LE edema SKIN: Warm and dry NEUROLOGIC:  Alert and oriented x 3 PSYCHIATRIC:  Normal affect   ASSESSMENT:    1. Coronary artery disease involving native coronary artery of native heart without angina pectoris   2. Lower extremity edema   3. Essential hypertension   4. Hyperlipidemia, unspecified hyperlipidemia type   5. Obesity (BMI 30-39.9)      PLAN:    CAD: Calcium  score 102 on 10/18/2021 (73rd percentile).  Denies any anginal symptoms -Continue rosuvastatin  40 mg daily and Zetia  10 mg daily.  Check lipid panel  LE edema/dyspnea: Due to nephrotic syndrome, with renal biopsy showing FSGS.  He is following with nephrology and was on tacrolimus .  Had good response, FSGS now in remission.  He is on tacrolimus  and is on torsemide  20 mg daily.  Appears euvolemic on exam today.  Mild diastolic dysfunction on TTE, do not suspect it is contributing much to his presentation.  Continue management per nephrology.  Hypertension: On lisinopril  10 mg daily.  Appears controlled  Hyperlipidemia: LDL 213 on 07/06/2020, started rosuvastatin  20 mg daily.  LDL 104 on 09/07/2021.  Calcium  score 102 on 10/18/2021 (73rd percentile).  Zetia  10 mg daily added.  LDL 94 on 12/09/2022, and rosuvastatin  increased to 40 mg daily. Check lipid panel, goal LDL less than 70 given CAD  OSA: continue CPAP, reports compliance  Obesity: Body mass index is 38.56 kg/m.  He has lost 30 pounds in last year, congratulated patient on weight loss   RTC in 1 year   Medication Adjustments/Labs and Tests  Ordered: Current medicines are reviewed at length with the patient today.  Concerns regarding medicines are outlined above.  Orders Placed This Encounter  Procedures   Lipid Profile   EKG 12-Lead   No orders of the defined types were placed in this encounter.   Patient Instructions  Medication Instructions:  Your physician recommends that you continue on your current medications as directed. Please refer to the Current Medication list given to you today.  *If you need a refill on your cardiac medications before your next appointment, please call your pharmacy*  Lab Work: Today: Lipids  If you have any lab test that is abnormal or we need to change your treatment, we will call you to review the results.  Testing/Procedures: None ordered  Follow-Up: At [facility], you and your health needs are our priority.  As part of our continuing mission to provide you with exceptional heart care, our providers are all part of one team.  This team includes your primary Cardiologist (physician) and Advanced Practice Providers or APPs (Physician Assistants and Nurse Practitioners) who all work together to provide you with the care you need, when you need it.  Your next appointment:   1 year(s)  Provider:   Dr. Kate    Thank you for choosing [facility]!!   [phone]         Signed, Lonni LITTIE Kate, MD  03/07/2024 9:50 AM    [facility]

## 2024-03-07 ENCOUNTER — Ambulatory Visit: Attending: Cardiology | Admitting: Cardiology

## 2024-03-07 VITALS — BP 124/78 | HR 93 | Ht 68.0 in | Wt 253.6 lb

## 2024-03-07 DIAGNOSIS — E785 Hyperlipidemia, unspecified: Secondary | ICD-10-CM | POA: Diagnosis not present

## 2024-03-07 DIAGNOSIS — R600 Localized edema: Secondary | ICD-10-CM

## 2024-03-07 DIAGNOSIS — I10 Essential (primary) hypertension: Secondary | ICD-10-CM | POA: Diagnosis not present

## 2024-03-07 DIAGNOSIS — E669 Obesity, unspecified: Secondary | ICD-10-CM | POA: Diagnosis not present

## 2024-03-07 DIAGNOSIS — I2510 Atherosclerotic heart disease of native coronary artery without angina pectoris: Secondary | ICD-10-CM | POA: Diagnosis not present

## 2024-03-07 DIAGNOSIS — F39 Unspecified mood [affective] disorder: Secondary | ICD-10-CM | POA: Diagnosis not present

## 2024-03-07 NOTE — Patient Instructions (Addendum)
 Medication Instructions:  Your physician recommends that you continue on your current medications as directed. Please refer to the Current Medication list given to you today.  *If you need a refill on your cardiac medications before your next appointment, please call your pharmacy*  Lab Work: Today: Lipids  If you have any lab test that is abnormal or we need to change your treatment, we will call you to review the results.  Testing/Procedures: None ordered  Follow-Up: At [facility], you and your health needs are our priority.  As part of our continuing mission to provide you with exceptional heart care, our providers are all part of one team.  This team includes your primary Cardiologist (physician) and Advanced Practice Providers or APPs (Physician Assistants and Nurse Practitioners) who all work together to provide you with the care you need, when you need it.  Your next appointment:   1 year(s)  Provider:   Dr. Kate    Thank you for choosing [facility]!!   [phone]

## 2024-03-08 ENCOUNTER — Ambulatory Visit: Payer: Self-pay | Admitting: Cardiology

## 2024-03-08 LAB — LIPID PANEL
Chol/HDL Ratio: 3.8 ratio (ref 0.0–5.0)
Cholesterol, Total: 137 mg/dL (ref 100–199)
HDL: 36 mg/dL — ABNORMAL LOW (ref >39)
LDL Chol Calc (NIH): 48 mg/dL (ref 0–99)
Triglycerides: 348 mg/dL — ABNORMAL HIGH (ref 0–149)
VLDL Cholesterol Cal: 53 mg/dL — ABNORMAL HIGH (ref 5–40)

## 2024-03-13 ENCOUNTER — Other Ambulatory Visit (HOSPITAL_COMMUNITY): Payer: Self-pay

## 2024-03-13 ENCOUNTER — Other Ambulatory Visit: Payer: Self-pay

## 2024-03-13 ENCOUNTER — Encounter: Payer: Self-pay | Admitting: *Deleted

## 2024-03-14 ENCOUNTER — Other Ambulatory Visit: Payer: Self-pay

## 2024-03-18 ENCOUNTER — Other Ambulatory Visit: Payer: Self-pay | Admitting: Pharmacy Technician

## 2024-03-18 ENCOUNTER — Other Ambulatory Visit: Payer: Self-pay

## 2024-03-18 ENCOUNTER — Other Ambulatory Visit (HOSPITAL_COMMUNITY): Payer: Self-pay

## 2024-03-18 NOTE — Progress Notes (Signed)
 Specialty Pharmacy Refill Coordination Note  Darren Hamilton is a 63 y.o. male contacted today regarding refills of specialty medication(s) Apremilast  (Otezla )   Patient requested Delivery   Delivery date: 03/22/24   Verified address: [address]  [place]   Medication will be filled on 03/21/24.

## 2024-03-19 ENCOUNTER — Other Ambulatory Visit (HOSPITAL_COMMUNITY): Payer: Self-pay

## 2024-03-19 ENCOUNTER — Other Ambulatory Visit: Payer: Self-pay

## 2024-03-20 ENCOUNTER — Other Ambulatory Visit: Payer: Self-pay

## 2024-03-20 ENCOUNTER — Other Ambulatory Visit (HOSPITAL_COMMUNITY): Payer: Self-pay

## 2024-03-21 ENCOUNTER — Other Ambulatory Visit: Payer: Self-pay

## 2024-03-28 ENCOUNTER — Other Ambulatory Visit (HOSPITAL_COMMUNITY): Payer: Self-pay

## 2024-03-28 ENCOUNTER — Telehealth: Payer: Self-pay | Admitting: Family Medicine

## 2024-03-28 ENCOUNTER — Other Ambulatory Visit: Payer: Self-pay

## 2024-03-28 DIAGNOSIS — F32A Depression, unspecified: Secondary | ICD-10-CM | POA: Diagnosis not present

## 2024-03-28 MED ORDER — FEBUXOSTAT 40 MG PO TABS
40.0000 mg | ORAL_TABLET | Freq: Every day | ORAL | 3 refills | Status: AC
  Filled 2024-03-28 – 2024-04-08 (×2): qty 90, 90d supply, fill #0

## 2024-03-28 NOTE — Telephone Encounter (Signed)
 Pt Request to reschedule appointment

## 2024-04-01 DIAGNOSIS — N1831 Chronic kidney disease, stage 3a: Secondary | ICD-10-CM | POA: Diagnosis not present

## 2024-04-08 ENCOUNTER — Other Ambulatory Visit (HOSPITAL_COMMUNITY): Payer: Self-pay

## 2024-04-08 ENCOUNTER — Other Ambulatory Visit: Payer: Self-pay

## 2024-04-11 ENCOUNTER — Other Ambulatory Visit (HOSPITAL_COMMUNITY): Payer: Self-pay

## 2024-04-11 DIAGNOSIS — F39 Unspecified mood [affective] disorder: Secondary | ICD-10-CM | POA: Diagnosis not present

## 2024-04-15 ENCOUNTER — Other Ambulatory Visit: Payer: Self-pay

## 2024-04-15 ENCOUNTER — Other Ambulatory Visit (HOSPITAL_COMMUNITY): Payer: Self-pay

## 2024-04-15 NOTE — Progress Notes (Signed)
 Specialty Pharmacy Refill Coordination Note  Darren Hamilton is a 63 y.o. male contacted today regarding refills of specialty medication(s) Apremilast  (Otezla )   Patient requested Delivery   Delivery date: 04/22/24   Verified address: [address]  [place]   Medication will be filled on: 04/19/24

## 2024-04-16 ENCOUNTER — Other Ambulatory Visit: Payer: Self-pay

## 2024-04-18 ENCOUNTER — Other Ambulatory Visit: Payer: Self-pay

## 2024-04-25 DIAGNOSIS — N1831 Chronic kidney disease, stage 3a: Secondary | ICD-10-CM | POA: Diagnosis not present

## 2024-04-25 DIAGNOSIS — L281 Prurigo nodularis: Secondary | ICD-10-CM | POA: Diagnosis not present

## 2024-04-25 DIAGNOSIS — D2272 Melanocytic nevi of left lower limb, including hip: Secondary | ICD-10-CM | POA: Diagnosis not present

## 2024-04-25 DIAGNOSIS — L821 Other seborrheic keratosis: Secondary | ICD-10-CM | POA: Diagnosis not present

## 2024-04-25 DIAGNOSIS — N041 Nephrotic syndrome with focal and segmental glomerular lesions: Secondary | ICD-10-CM | POA: Diagnosis not present

## 2024-04-25 DIAGNOSIS — D225 Melanocytic nevi of trunk: Secondary | ICD-10-CM | POA: Diagnosis not present

## 2024-04-25 DIAGNOSIS — D2271 Melanocytic nevi of right lower limb, including hip: Secondary | ICD-10-CM | POA: Diagnosis not present

## 2024-04-25 DIAGNOSIS — F39 Unspecified mood [affective] disorder: Secondary | ICD-10-CM | POA: Diagnosis not present

## 2024-04-25 DIAGNOSIS — L570 Actinic keratosis: Secondary | ICD-10-CM | POA: Diagnosis not present

## 2024-04-25 DIAGNOSIS — I10 Essential (primary) hypertension: Secondary | ICD-10-CM | POA: Diagnosis not present

## 2024-05-10 ENCOUNTER — Other Ambulatory Visit: Payer: Self-pay

## 2024-05-13 ENCOUNTER — Other Ambulatory Visit (HOSPITAL_BASED_OUTPATIENT_CLINIC_OR_DEPARTMENT_OTHER): Payer: Self-pay

## 2024-05-13 ENCOUNTER — Other Ambulatory Visit: Payer: Self-pay

## 2024-05-13 ENCOUNTER — Other Ambulatory Visit (HOSPITAL_COMMUNITY): Payer: Self-pay

## 2024-05-13 ENCOUNTER — Other Ambulatory Visit: Payer: Self-pay | Admitting: Pharmacist

## 2024-05-13 MED ORDER — OTEZLA 30 MG PO TABS: ORAL_TABLET | ORAL | 5 refills | Status: DC

## 2024-05-13 MED ORDER — OTEZLA 30 MG PO TABS
ORAL_TABLET | ORAL | 5 refills | Status: DC
  Filled 2024-05-13 – 2024-05-21 (×3): qty 60, 30d supply, fill #0

## 2024-05-14 ENCOUNTER — Other Ambulatory Visit: Payer: Self-pay

## 2024-05-15 ENCOUNTER — Other Ambulatory Visit (HOSPITAL_COMMUNITY): Payer: Self-pay

## 2024-05-15 MED ORDER — ALBUTEROL SULFATE HFA 108 (90 BASE) MCG/ACT IN AERS
2.0000 | INHALATION_SPRAY | RESPIRATORY_TRACT | 11 refills | Status: AC | PRN
  Filled 2024-05-15: qty 6.7, 20d supply, fill #0
  Filled 2024-06-23: qty 6.7, 17d supply, fill #1

## 2024-05-16 ENCOUNTER — Other Ambulatory Visit: Payer: Self-pay

## 2024-05-16 ENCOUNTER — Other Ambulatory Visit (HOSPITAL_COMMUNITY): Payer: Self-pay

## 2024-05-16 DIAGNOSIS — G4733 Obstructive sleep apnea (adult) (pediatric): Secondary | ICD-10-CM | POA: Diagnosis not present

## 2024-05-17 ENCOUNTER — Other Ambulatory Visit: Payer: Self-pay

## 2024-05-19 ENCOUNTER — Other Ambulatory Visit (HOSPITAL_COMMUNITY): Payer: Self-pay

## 2024-05-20 ENCOUNTER — Other Ambulatory Visit: Payer: Self-pay

## 2024-05-20 ENCOUNTER — Other Ambulatory Visit (HOSPITAL_COMMUNITY): Payer: Self-pay

## 2024-05-20 MED ORDER — ROSUVASTATIN CALCIUM 20 MG PO TABS
20.0000 mg | ORAL_TABLET | Freq: Every day | ORAL | 1 refills | Status: AC
  Filled 2024-05-20: qty 90, 90d supply, fill #0

## 2024-05-21 ENCOUNTER — Telehealth: Payer: Self-pay

## 2024-05-21 ENCOUNTER — Other Ambulatory Visit: Payer: Self-pay

## 2024-05-21 ENCOUNTER — Other Ambulatory Visit (HOSPITAL_COMMUNITY): Payer: Self-pay

## 2024-05-21 NOTE — Progress Notes (Signed)
 Specialty Pharmacy Refill Coordination Note  Darren Hamilton is a 63 y.o. male contacted today regarding refills of specialty medication(s) Apremilast  (Otezla )   Patient requested Delivery   Delivery date: 05/24/24   Verified address: [address]  [place]   Medication will be filled on: 05/23/24  Prior auth required, routed to [place]. Patient aware that medication may be delayed.

## 2024-05-21 NOTE — Telephone Encounter (Addendum)
 Pharmacy Patient Advocate Encounter   Received notification from Pt Calls Messages that prior authorization for Otezla  is required/requested.   Insurance verification completed.   The patient is insured through [facility].   Per test claim: PA required; PA submitted to above mentioned insurance via Latent Key/confirmation #/EOC [id number] Status is pending

## 2024-05-23 ENCOUNTER — Other Ambulatory Visit: Payer: Self-pay

## 2024-05-23 DIAGNOSIS — H524 Presbyopia: Secondary | ICD-10-CM | POA: Diagnosis not present

## 2024-05-24 ENCOUNTER — Other Ambulatory Visit: Payer: Self-pay

## 2024-05-24 NOTE — Telephone Encounter (Signed)
 Pharmacy Patient Advocate Encounter  Received notification from [facility] that Prior Authorization for Otezla  has been APPROVED from 05/24/24 to 05/23/25   PA #/Case ID/Reference #: [id number]

## 2024-05-26 NOTE — Progress Notes (Unsigned)
 "     Ellouise Console, PA-C [address] [place], [place]  [address] Phone: [phone]   Gastroenterology Consultation  Referring Provider:     Hughie Sharper, MD Primary Care Physician:  Hughie Sharper, MD Primary Gastroenterologist:  Ellouise Console, PA-C / Elspeth Naval, MD  Reason for Consultation:     Chronic intermittent diarrhea        HPI:   Discussed the use of AI scribe software for clinical note transcription with the patient, who gave verbal consent to proceed. History of Present Illness Darren Hamilton is a 63 year old male with psoriatic arthritis and kidney disease who presents for evaluation of chronic bowel urgency and intermittent diarrhea.  Bowel urgency and diarrhea - Chronic fecal urgency and variable bowel frequency for 3-4 years, present about one-third to one-half of the time - Abrupt urgency requiring bathroom access within five minutes, leading to occasional minor accidents - Mornings often begin with constipation, progressing to loose stools or diarrhea, with up to three bowel movements within 1-2 hours - Averages 2-3 bowel movements per day; first stool typically more formed, subsequent stools looser - Unpredictable urgency, particularly problematic in public settings  - No clear dietary triggers identified - No nocturnal diarrhea; rarely wakes at night for bowel movements or urination - No recent antibiotic use - No hematochezia, melena, or abnormal stool color except when taking Pepto Bismol - Trials of Pepto Bismol and Imodium provided minimal benefit, as symptoms often resolve before medication takes effect  Abdominal cramping - Intermittent lower abdominal cramping, sometimes post-defecation - Cramping lasts from a few minutes up to ten minutes - Frequency varies from daily to weekly  Upper gastrointestinal symptoms - Occasional mild reflux, typically triggered by tomato sauce - No regular use of antacids or acid suppressants - No  dysphagia or sensation of food impaction - No persistent or severe upper GI symptoms  Prior gastrointestinal evaluation - First Screening Colonoscopy performed 10 years ago at [facility] was reportedly normal. - No history of upper endoscopy - Previous evaluation for celiac disease and a two-month gluten-free diet trial did not improve symptoms  Medication effects - Current medication: Otezla  for psoriatic arthritis - Initial use of Otezla  associated with increased diarrhea, which resolved after 2-3 weeks, returning to baseline loose stools - No new medications started recently  Fluid intake and renal considerations - Fluid intake limited to approximately 64 ounces per day for kidney disease  Family history of gastrointestinal disease - Sister with Crohn's disease and celiac disease  PMH: HTN, sleep apnea on CPAP, diastolic dysfunction, FSGS- Focal Segmental Glomerulosclerosis, nephrotic syndrome, psoriatic arthritis, obesity, asthma, gout.  02/2019 echo showed normal LVEF 55 to 60%.  Past Medical History:  Diagnosis Date   Anxiety    Asthma    Depression    Hypertension    Nephrotic syndrome     Past Surgical History:  Procedure Laterality Date   RENAL BIOPSY      Prior to Admission medications  Medication Sig Start Date End Date Taking? Authorizing Provider  acetaminophen  (TYLENOL ) 500 MG tablet Take 500-1,000 mg by mouth every 6 (six) hours as needed for mild pain or moderate pain.    [provider]  albuterol  (VENTOLIN  HFA) 108 (90 Base) MCG/ACT inhaler TAKE 2 PUFFS BY MOUTH EVERY 6 HOURS AS NEEDED FOR WHEEZE OR SHORTNESS OF BREATH 02/25/20   Hilts, Sharper, MD  albuterol  (VENTOLIN  HFA) 108 (90 Base) MCG/ACT inhaler Inhale 2 puffs into the lungs every  4 to six hours as needed. 05/15/24     Apremilast  (OTEZLA ) 30 MG TABS Take 1 tablet Orally Twice a day 05/13/24   Jegede, Olugbemiga E, MD  Cholecalciferol (DIALYVITE VITAMIN D  5000 PO) Take 5,000 Units by mouth daily.      [provider]  colchicine  0.6 MG tablet Take 1 tablet (0.6 mg total) by mouth daily. 11/19/19   Samtani, Jai-Gurmukh, MD  colchicine  0.6 MG tablet Take 1 tablet (0.6 mg total) by mouth daily. 05/23/23   Tobie Gordy POUR, MD  dapagliflozin  propanediol (FARXIGA ) 10 MG TABS tablet Take 1 tablet (10 mg total) by mouth daily. 11/21/23     dapagliflozin  propanediol (FARXIGA ) 10 MG TABS tablet Take 1 tablet (10 mg total) by mouth daily. 11/14/23     escitalopram  (LEXAPRO ) 20 MG tablet Take 1 tablet (20 mg total) by mouth daily. 12/15/23   Hilts, Ozell, MD  escitalopram  (LEXAPRO ) 20 MG tablet Take 1 tablet (20 mg total) by mouth daily. 12/18/23     ezetimibe  (ZETIA ) 10 MG tablet TAKE 1 TABLET BY MOUTH EVERY DAY 01/25/24   Kate Lonni CROME, MD  ezetimibe  (ZETIA ) 10 MG tablet Take 1 tablet (10 mg total) by mouth daily. 01/25/24     FARXIGA  10 MG TABS tablet Take 10 mg by mouth daily. 05/27/20   [provider]  febuxostat  (ULORIC ) 40 MG tablet Take 40 mg by mouth daily. 11/17/22   [provider]  febuxostat  (ULORIC ) 40 MG tablet Take 1 tablet (40 mg total) by mouth daily. 03/28/24     gemfibrozil  (LOPID ) 600 MG tablet Take 1 tablet (600 mg total) by mouth 2 (two) times daily with a meal. 10/17/23   Hilts, Ozell, MD  levocetirizine (XYZAL) 5 MG tablet daily. 08/20/21   [provider]  lisinopril  (ZESTRIL ) 10 MG tablet TAKE 1 TABLET BY MOUTH EVERY DAY 05/09/20   Hilts, Ozell, MD  lisinopril  (ZESTRIL ) 10 MG tablet Take 1 tablet (10 mg total) by mouth daily. 01/09/24     methocarbamol  (ROBAXIN ) 750 MG tablet Take 1 tablet (750 mg total) by mouth every 6 (six) hours as needed. 02/13/24     montelukast  (SINGULAIR ) 10 MG tablet Take 10 mg by mouth at bedtime. 11/06/22   [provider]  montelukast  (SINGULAIR ) 10 MG tablet Take 1 tablet (10 mg total) by mouth daily as needed. Patient not taking: Reported on 03/07/2024 03/23/23     QVAR  REDIHALER 80 MCG/ACT inhaler 1 puff  2 (two) times daily. 09/03/21   [provider]  rosuvastatin  (CRESTOR ) 20 MG tablet Take 1 tablet (20 mg total) by mouth daily. 05/20/24     rosuvastatin  (CRESTOR ) 20 MG tablet Take 1 tablet (20 mg total) by mouth daily. 05/20/24     rosuvastatin  (CRESTOR ) 40 MG tablet Take 1 tablet (40 mg total) by mouth daily. 12/13/22 08/23/23  Kate Lonni CROME, MD  tacrolimus  (PROGRAF ) 1 MG capsule Take 2 mg by mouth 2 (two) times daily. 06/25/23   [provider]  tacrolimus  (PROGRAF ) 1 MG capsule Take 2 capsules (2 mg total) by mouth 2 (two) times daily. Patient taking differently: Take 2 mg by mouth 2 (two) times daily. Take 2mg  in the morning, take 1 mg at night 02/02/24     torsemide  (DEMADEX ) 100 MG tablet Take 100 mg by mouth as needed.    [provider]  torsemide  (DEMADEX ) 20 MG tablet Take 2 tablets (40 mg total) by mouth in the morning. May also take 1  tablet (20 mg total) daily as needed. Patient taking differently: Take 1 tablet by mouth in the morning. May also take 1 tablet (20 mg total) daily as needed. 06/16/23       Family History  Problem Relation Age of Onset   Heart failure Mother    Heart failure Father    Celiac disease Sister    Crohn's disease Sister    Diabetes Maternal Grandmother    Heart disease Maternal Grandmother    Kidney failure Maternal Grandfather    Lung cancer Paternal Grandmother    Heart disease Paternal Grandfather    Celiac disease Other    Sleep apnea Neg Hx      Social History[1]  Allergies as of 05/27/2024   (No Known Allergies)    Review of Systems:    All systems reviewed and negative except where noted in HPI.   Physical Exam:  BP 112/70 (BP Location: Right Arm, Patient Position: Sitting, Cuff Size: Large)   Pulse 80   Ht 5' 6.75 (1.695 m)   Wt 254 lb 8 oz (115.4 kg)   BMI 40.16 kg/m  No LMP for male patient.  General:   Alert,  Well-developed, well-nourished, pleasant and cooperative in NAD Lungs:   Respirations even and unlabored.  Clear throughout to auscultation.   No wheezes, crackles, or rhonchi. No acute distress. Heart:  Regular rate and rhythm; no murmurs, clicks, rubs, or gallops. Abdomen:  Normal bowel sounds.  No bruits.  Soft, and non-distended without masses, hepatosplenomegaly or hernias noted.  No Tenderness.  No guarding or rebound tenderness.    Neurologic:  Alert and oriented x3;  grossly normal neurologically. Psych:  Alert and cooperative. Normal mood and affect.   Imaging Studies: No results found.  Labs: CBC    Component Value Date/Time   WBC 8.6 05/27/2024 0954   RBC 5.67 05/27/2024 0954   HGB 16.2 05/27/2024 0954   HCT 47.9 05/27/2024 0954   PLT 324.0 05/27/2024 0954   MCV 84.5 05/27/2024 0954   MCV 88.0 11/09/2014 1318    CMP     Component Value Date/Time   NA 139 05/27/2024 0954   NA 138 04/09/2019 0822   K 3.8 05/27/2024 0954   CL 103 05/27/2024 0954   CO2 26 05/27/2024 0954   GLUCOSE 120 (H) 05/27/2024 0954   BUN 22 05/27/2024 0954   BUN 20 04/09/2019 0822   CREATININE 1.56 (H) 05/27/2024 0954   CREATININE 1.05 12/25/2018 1700   CALCIUM  9.1 05/27/2024 0954   PROT 6.8 05/27/2024 0954   PROT 4.3 (LL) 04/01/2019 1246   ALBUMIN  4.4 05/27/2024 0954   ALBUMIN  2.0 (L) 04/01/2019 1246   AST 15 05/27/2024 0954   ALT 15 05/27/2024 0954   ALKPHOS 65 05/27/2024 0954   BILITOT 0.7 05/27/2024 0954   BILITOT <0.2 04/01/2019 1246   GFRNONAA 43 (L) 11/19/2019 0904   GFRAA 49 (L) 11/19/2019 0904    Assessment and Plan:   Mae Cianci is a 63 y.o. y/o male has been referred for:  1.  Chronic intermittent diarrhea 2.  Colon cancer screening 3.  Family history of sister with celiac 4.  Family history of sister with Crohn's disease  Plan: - Labs: CBC, CMP, celiac screen - Stool test fecal calprotectin - Scheduling colonoscopy - Trial of IBgard and FiberCon tablets Assessment & Plan  Chronic diarrhea and fecal urgency, likely irritable  bowel syndrome Persistent diarrhea and fecal urgency suggest irritable bowel syndrome; celiac disease and inflammatory  bowel disease need exclusion.  Adverse side effect of medication and microscopic colitis are also in the differential.   - Recommended Fibercon, two tablets twice daily; discontinue one week prior to colonoscopy. - Discussed IB Guard (peppermint oil), two tablets twice daily; provided samples. - Reviewed dicyclomine and hyoscyamine for cramping and diarrhea; discussed anticholinergic side effects.  He can call back for prescription if needed.. - Advised adequate hydration, balancing with fluid restriction due to kidney disease. - Provided written information on Fibercon and IB Guard.  2.  Colorectal cancer screening (colonoscopy) Due for 10-year repeat colorectal cancer screening based on age and interval since last normal colonoscopy.  - Scheduled colonoscopy, accommodating for Dr. Leigh preference. - Provided Suprep bowel preparation instructions, emphasizing hydration. - Instructed to discontinue Fibercon one week prior and follow a low fiber, soft food diet the week before. - Advised that abnormal celiac serologies may necessitate upper endoscopy at colonoscopy. - I discussed risks of colonoscopy with patient to include risk of bleeding, colon perforation, and risk of sedation.  Patient expressed understanding and agrees to proceed with colonoscopy.   3.  Chronic diarrhea: Evaluate for celiac disease and inflammatory bowel disease Further evaluation warranted due to chronic diarrhea and family history. Repeat celiac serologies and fecal calprotectin appropriate to exclude celiac disease and inflammatory bowel disease. - Lab: CBC, CMP, celiac screen. - Ordered celiac serologies. - Ordered fecal calprotectin. - Discussed that abnormal celiac serologies will lead to recommendation for upper endoscopy with duodenal biopsy.  Follow up 4 weeks after colonoscopy to follow-up  with intermittent diarrhea.  Ellouise Console, PA-C       [1]  Social History Tobacco Use   Smoking status: Never   Smokeless tobacco: Never  Vaping Use   Vaping status: Never Used  Substance Use Topics   Alcohol use: Yes    Alcohol/week: 3.0 standard drinks of alcohol    Types: 1 Glasses of wine, 1 Cans of beer, 1 Shots of liquor per week    Comment: occasionally   Drug use: No   "

## 2024-05-27 ENCOUNTER — Ambulatory Visit: Admitting: Physician Assistant

## 2024-05-27 ENCOUNTER — Other Ambulatory Visit (INDEPENDENT_AMBULATORY_CARE_PROVIDER_SITE_OTHER)

## 2024-05-27 ENCOUNTER — Other Ambulatory Visit (HOSPITAL_COMMUNITY): Payer: Self-pay

## 2024-05-27 ENCOUNTER — Encounter: Payer: Self-pay | Admitting: Physician Assistant

## 2024-05-27 VITALS — BP 112/70 | HR 80 | Ht 66.75 in | Wt 254.5 lb

## 2024-05-27 DIAGNOSIS — R195 Other fecal abnormalities: Secondary | ICD-10-CM

## 2024-05-27 DIAGNOSIS — R109 Unspecified abdominal pain: Secondary | ICD-10-CM

## 2024-05-27 DIAGNOSIS — Z1211 Encounter for screening for malignant neoplasm of colon: Secondary | ICD-10-CM

## 2024-05-27 DIAGNOSIS — Z8379 Family history of other diseases of the digestive system: Secondary | ICD-10-CM | POA: Diagnosis not present

## 2024-05-27 DIAGNOSIS — K529 Noninfective gastroenteritis and colitis, unspecified: Secondary | ICD-10-CM | POA: Diagnosis not present

## 2024-05-27 DIAGNOSIS — R152 Fecal urgency: Secondary | ICD-10-CM | POA: Diagnosis not present

## 2024-05-27 LAB — CBC WITH DIFFERENTIAL/PLATELET
Basophils Absolute: 0.1 K/uL (ref 0.0–0.1)
Basophils Relative: 1.3 % (ref 0.0–3.0)
Eosinophils Absolute: 0.3 K/uL (ref 0.0–0.7)
Eosinophils Relative: 3.9 % (ref 0.0–5.0)
HCT: 47.9 % (ref 39.0–52.0)
Hemoglobin: 16.2 g/dL (ref 13.0–17.0)
Lymphocytes Relative: 18.6 % (ref 12.0–46.0)
Lymphs Abs: 1.6 K/uL (ref 0.7–4.0)
MCHC: 33.9 g/dL (ref 30.0–36.0)
MCV: 84.5 fl (ref 78.0–100.0)
Monocytes Absolute: 0.8 K/uL (ref 0.1–1.0)
Monocytes Relative: 9.5 % (ref 3.0–12.0)
Neutro Abs: 5.7 K/uL (ref 1.4–7.7)
Neutrophils Relative %: 66.7 % (ref 43.0–77.0)
Platelets: 324 K/uL (ref 150.0–400.0)
RBC: 5.67 Mil/uL (ref 4.22–5.81)
RDW: 14.9 % (ref 11.5–15.5)
WBC: 8.6 K/uL (ref 4.0–10.5)

## 2024-05-27 LAB — COMPREHENSIVE METABOLIC PANEL WITH GFR
ALT: 15 U/L (ref 3–53)
AST: 15 U/L (ref 5–37)
Albumin: 4.4 g/dL (ref 3.5–5.2)
Alkaline Phosphatase: 65 U/L (ref 39–117)
BUN: 22 mg/dL (ref 6–23)
CO2: 26 meq/L (ref 19–32)
Calcium: 9.1 mg/dL (ref 8.4–10.5)
Chloride: 103 meq/L (ref 96–112)
Creatinine, Ser: 1.56 mg/dL — ABNORMAL HIGH (ref 0.40–1.50)
GFR: 47.71 mL/min — ABNORMAL LOW
Glucose, Bld: 120 mg/dL — ABNORMAL HIGH (ref 70–99)
Potassium: 3.8 meq/L (ref 3.5–5.1)
Sodium: 139 meq/L (ref 135–145)
Total Bilirubin: 0.7 mg/dL (ref 0.2–1.2)
Total Protein: 6.8 g/dL (ref 6.0–8.3)

## 2024-05-27 MED ORDER — NA SULFATE-K SULFATE-MG SULF 17.5-3.13-1.6 GM/177ML PO SOLN
1.0000 | Freq: Once | ORAL | 0 refills | Status: AC
  Filled 2024-05-27: qty 354, 1d supply, fill #0

## 2024-05-27 NOTE — Patient Instructions (Addendum)
 Your provider has requested that you go to the basement level for lab work before leaving today. Press B on the elevator. The lab is located at the first door on the left as you exit the elevator.   For Diarrhea / Loose Stools: Start FiberCon Take 2 Caplets with a full glass of water or other liquid (8 ounces/240 milliliters)  Once Daily.  Increase to 2 Caplets Twice daily if needed.  NOTE:  HOLD FIBERCON 1 WEEK BEFORE COLONOSCOPY to ensure good prep.    For Irritable Bowel Syndrome / Colon Spasm / Abdominal Cramps: IB Gard (Peppermint Oil) - Over the Counter Take 2 capsules Twice daily   You have been scheduled for a Colonoscopy. Please follow written instructions given to you at your visit today.   If you use inhalers (even only as needed), please bring them with you on the day of your procedure.  DO NOT TAKE 7 DAYS PRIOR TO TEST- Trulicity (dulaglutide) Ozempic, Wegovy (semaglutide) Mounjaro (tirzepatide) Bydureon Bcise (exanatide extended release)  DO NOT TAKE 1 DAY PRIOR TO YOUR TEST Rybelsus (semaglutide) Adlyxin (lixisenatide) Victoza (liraglutide) Byetta (exanatide) ___________________________________________________________________________  Please follow up sooner if symptoms increase or worsen   Due to recent changes in healthcare laws, you may see the results of your imaging and laboratory studies on MyChart before your provider has had a chance to review them.  We understand that in some cases there may be results that are confusing or concerning to you. Not all laboratory results come back in the same time frame and the provider may be waiting for multiple results in order to interpret others.  Please give us  48 hours in order for your provider to thoroughly review all the results before contacting the office for clarification of your results.   Thank you for trusting me with your gastrointestinal care!   Ellouise Console,  PA-C _______________________________________________________  If your blood pressure at your visit was 140/90 or greater, please contact your primary care physician to follow up on this.  _______________________________________________________  If you are age 83 or older, your body mass index should be between 23-30. Your Body mass index is 40.16 kg/m. If this is out of the aforementioned range listed, please consider follow up with your Primary Care Provider.  If you are age 24 or younger, your body mass index should be between 19-25. Your Body mass index is 40.16 kg/m. If this is out of the aformentioned range listed, please consider follow up with your Primary Care Provider.   ________________________________________________________  The [facility] providers would like to encourage you to use MYCHART to communicate with providers for non-urgent requests or questions.  Due to long hold times on the telephone, sending your provider a message by [facility] may be a faster and more efficient way to get a response.  Please allow 48 business hours for a response.  Please remember that this is for non-urgent requests.  _______________________________________________________

## 2024-05-27 NOTE — Progress Notes (Signed)
 Agree with assessment and plan as outlined.

## 2024-05-31 LAB — CELIAC DISEASE AB SCREEN W/RFX
Deamidated Gliadin Abs, IgA: 5 U (ref 0–19)
Immunoglobulin A, (IgA) QN, Serum: 213 mg/dL (ref 61–437)
t-Transglutaminase (tTG) IgA: <2 U/mL (ref 0–3)

## 2024-06-02 ENCOUNTER — Ambulatory Visit: Payer: Self-pay | Admitting: Physician Assistant

## 2024-06-03 ENCOUNTER — Other Ambulatory Visit (HOSPITAL_COMMUNITY): Payer: Self-pay

## 2024-06-03 ENCOUNTER — Other Ambulatory Visit (INDEPENDENT_AMBULATORY_CARE_PROVIDER_SITE_OTHER): Payer: Self-pay

## 2024-06-03 ENCOUNTER — Other Ambulatory Visit: Payer: Self-pay

## 2024-06-03 MED ORDER — GEMFIBROZIL 600 MG PO TABS
600.0000 mg | ORAL_TABLET | Freq: Two times a day (BID) | ORAL | 1 refills | Status: AC
  Filled 2024-06-03: qty 180, 90d supply, fill #0

## 2024-06-11 ENCOUNTER — Other Ambulatory Visit (HOSPITAL_COMMUNITY): Payer: Self-pay

## 2024-06-11 ENCOUNTER — Other Ambulatory Visit: Payer: Self-pay

## 2024-06-12 ENCOUNTER — Other Ambulatory Visit: Payer: Self-pay

## 2024-06-12 ENCOUNTER — Encounter: Payer: Self-pay | Admitting: Pharmacist

## 2024-06-13 ENCOUNTER — Other Ambulatory Visit (HOSPITAL_BASED_OUTPATIENT_CLINIC_OR_DEPARTMENT_OTHER): Payer: Self-pay

## 2024-06-13 ENCOUNTER — Other Ambulatory Visit: Payer: Self-pay

## 2024-06-13 ENCOUNTER — Other Ambulatory Visit (HOSPITAL_COMMUNITY): Payer: Self-pay

## 2024-06-13 ENCOUNTER — Other Ambulatory Visit: Payer: Self-pay | Admitting: Pharmacist

## 2024-06-13 MED ORDER — OTEZLA 30 MG PO TABS
30.0000 mg | ORAL_TABLET | Freq: Two times a day (BID) | ORAL | 3 refills | Status: DC
  Filled 2024-06-13: qty 60, 30d supply, fill #0

## 2024-06-13 MED ORDER — OTEZLA 30 MG PO TABS
30.0000 mg | ORAL_TABLET | Freq: Two times a day (BID) | ORAL | 3 refills | Status: AC
  Filled 2024-06-13: qty 180, 90d supply, fill #0
  Filled 2024-06-17 – 2024-06-21 (×2): qty 60, 30d supply, fill #0

## 2024-06-17 ENCOUNTER — Other Ambulatory Visit (HOSPITAL_COMMUNITY): Payer: Self-pay

## 2024-06-17 ENCOUNTER — Other Ambulatory Visit: Payer: Self-pay

## 2024-06-18 ENCOUNTER — Other Ambulatory Visit: Payer: Self-pay

## 2024-06-19 ENCOUNTER — Other Ambulatory Visit: Payer: Self-pay

## 2024-06-21 ENCOUNTER — Other Ambulatory Visit: Payer: Self-pay

## 2024-06-21 ENCOUNTER — Other Ambulatory Visit (HOSPITAL_COMMUNITY): Payer: Self-pay

## 2024-06-21 NOTE — Progress Notes (Signed)
 Specialty Pharmacy Refill Coordination Note  Darren Hamilton is a 64 y.o. male contacted today regarding refills of specialty medication(s) Apremilast  (Otezla )   Patient requested Delivery   Delivery date: 06/27/24   Verified address: [address]  [place]   Medication will be filled on: 06/26/24

## 2024-06-23 ENCOUNTER — Other Ambulatory Visit: Payer: Self-pay | Admitting: Cardiology

## 2024-06-23 ENCOUNTER — Other Ambulatory Visit (HOSPITAL_COMMUNITY): Payer: Self-pay

## 2024-06-24 ENCOUNTER — Other Ambulatory Visit (HOSPITAL_COMMUNITY): Payer: Self-pay

## 2024-06-24 ENCOUNTER — Other Ambulatory Visit: Payer: Self-pay

## 2024-06-26 ENCOUNTER — Other Ambulatory Visit: Payer: Self-pay

## 2024-06-27 ENCOUNTER — Ambulatory Visit: Admitting: Family Medicine

## 2024-07-01 ENCOUNTER — Other Ambulatory Visit (HOSPITAL_COMMUNITY): Payer: Self-pay

## 2024-07-01 ENCOUNTER — Other Ambulatory Visit: Payer: Self-pay

## 2024-07-01 MED FILL — Ezetimibe Tab 10 MG: ORAL | 90 days supply | Qty: 90 | Fill #0 | Status: AC

## 2024-07-08 ENCOUNTER — Other Ambulatory Visit (HOSPITAL_COMMUNITY): Payer: Self-pay

## 2024-07-08 ENCOUNTER — Other Ambulatory Visit: Payer: Self-pay

## 2024-07-08 MED ORDER — MONTELUKAST SODIUM 10 MG PO TABS
10.0000 mg | ORAL_TABLET | Freq: Every day | ORAL | 11 refills | Status: AC | PRN
  Filled 2024-07-08: qty 90, 90d supply, fill #0

## 2024-07-09 ENCOUNTER — Other Ambulatory Visit: Payer: Self-pay

## 2024-07-09 NOTE — Patient Instructions (Incomplete)

## 2024-07-12 ENCOUNTER — Other Ambulatory Visit: Payer: Self-pay

## 2024-07-12 NOTE — Progress Notes (Unsigned)
 SABRA

## 2024-07-15 ENCOUNTER — Ambulatory Visit: Admitting: Family Medicine

## 2024-07-15 DIAGNOSIS — G4733 Obstructive sleep apnea (adult) (pediatric): Secondary | ICD-10-CM

## 2024-07-29 ENCOUNTER — Encounter: Admitting: Gastroenterology
